# Patient Record
Sex: Female | Born: 1960 | Race: White | Hispanic: No | Marital: Married | Smoking: Never smoker
Health system: Southern US, Community
[De-identification: ages and names within clinical notes are randomized; demographics above are authoritative.]

## PROBLEM LIST (undated history)

## (undated) DIAGNOSIS — R519 Headache, unspecified: Secondary | ICD-10-CM

## (undated) DIAGNOSIS — Z806 Family history of leukemia: Secondary | ICD-10-CM

## (undated) DIAGNOSIS — I10 Essential (primary) hypertension: Secondary | ICD-10-CM

## (undated) DIAGNOSIS — T7840XA Allergy, unspecified, initial encounter: Secondary | ICD-10-CM

## (undated) DIAGNOSIS — Z803 Family history of malignant neoplasm of breast: Secondary | ICD-10-CM

## (undated) HISTORY — DX: Family history of malignant neoplasm of breast: Z80.3

## (undated) HISTORY — DX: Allergy, unspecified, initial encounter: T78.40XA

## (undated) HISTORY — PX: BREAST CYST EXCISION: SHX579

## (undated) HISTORY — DX: Family history of leukemia: Z80.6

## (undated) HISTORY — PX: BREAST BIOPSY: SHX20

## (undated) HISTORY — DX: Essential (primary) hypertension: I10

## (undated) HISTORY — PX: CHOLECYSTECTOMY: SHX55

---

## 1997-11-22 ENCOUNTER — Other Ambulatory Visit: Admission: RE | Admit: 1997-11-22 | Discharge: 1997-11-22 | Payer: Self-pay | Admitting: Obstetrics & Gynecology

## 1998-07-06 ENCOUNTER — Inpatient Hospital Stay (HOSPITAL_COMMUNITY): Admission: AD | Admit: 1998-07-06 | Discharge: 1998-07-09 | Payer: Self-pay | Admitting: Obstetrics & Gynecology

## 1998-10-23 ENCOUNTER — Other Ambulatory Visit: Admission: RE | Admit: 1998-10-23 | Discharge: 1998-10-23 | Payer: Self-pay | Admitting: Obstetrics & Gynecology

## 1999-10-25 ENCOUNTER — Other Ambulatory Visit: Admission: RE | Admit: 1999-10-25 | Discharge: 1999-10-25 | Payer: Self-pay | Admitting: Obstetrics & Gynecology

## 2000-11-18 ENCOUNTER — Other Ambulatory Visit: Admission: RE | Admit: 2000-11-18 | Discharge: 2000-11-18 | Payer: Self-pay | Admitting: Obstetrics & Gynecology

## 2000-11-19 ENCOUNTER — Encounter: Payer: Self-pay | Admitting: Family Medicine

## 2000-11-19 ENCOUNTER — Encounter: Admission: RE | Admit: 2000-11-19 | Discharge: 2000-11-19 | Payer: Self-pay | Admitting: Family Medicine

## 2000-12-10 ENCOUNTER — Observation Stay (HOSPITAL_COMMUNITY): Admission: RE | Admit: 2000-12-10 | Discharge: 2000-12-11 | Payer: Self-pay | Admitting: General Surgery

## 2000-12-10 ENCOUNTER — Encounter: Payer: Self-pay | Admitting: General Surgery

## 2000-12-10 ENCOUNTER — Encounter (INDEPENDENT_AMBULATORY_CARE_PROVIDER_SITE_OTHER): Payer: Self-pay | Admitting: Specialist

## 2001-11-25 ENCOUNTER — Other Ambulatory Visit: Admission: RE | Admit: 2001-11-25 | Discharge: 2001-11-25 | Payer: Self-pay | Admitting: Obstetrics & Gynecology

## 2002-11-30 ENCOUNTER — Other Ambulatory Visit: Admission: RE | Admit: 2002-11-30 | Discharge: 2002-11-30 | Payer: Self-pay | Admitting: Obstetrics & Gynecology

## 2003-12-06 ENCOUNTER — Other Ambulatory Visit: Admission: RE | Admit: 2003-12-06 | Discharge: 2003-12-06 | Payer: Self-pay | Admitting: Obstetrics & Gynecology

## 2005-01-21 ENCOUNTER — Other Ambulatory Visit: Admission: RE | Admit: 2005-01-21 | Discharge: 2005-01-21 | Payer: Self-pay | Admitting: Obstetrics & Gynecology

## 2009-02-09 ENCOUNTER — Encounter: Admission: RE | Admit: 2009-02-09 | Discharge: 2009-02-09 | Payer: Self-pay | Admitting: Obstetrics & Gynecology

## 2009-02-09 IMAGING — MG MM DIAGNOSTIC BILATERAL
1 series · 1 of 1 positions shown · non-contrast
Comparison: [DATE] and [DATE]

CLINICAL DATA: Patient presents for bilateral diagnostic mammogram
due to a new palpable abnormality in the upper outer left breast
felt by the referring physician.

DIGITAL DIAGNOSTIC  BILATERAL  MAMMOGRAM  WITH CAD AND BILATERAL
BREAST ULTRASOUND:

[R CC]
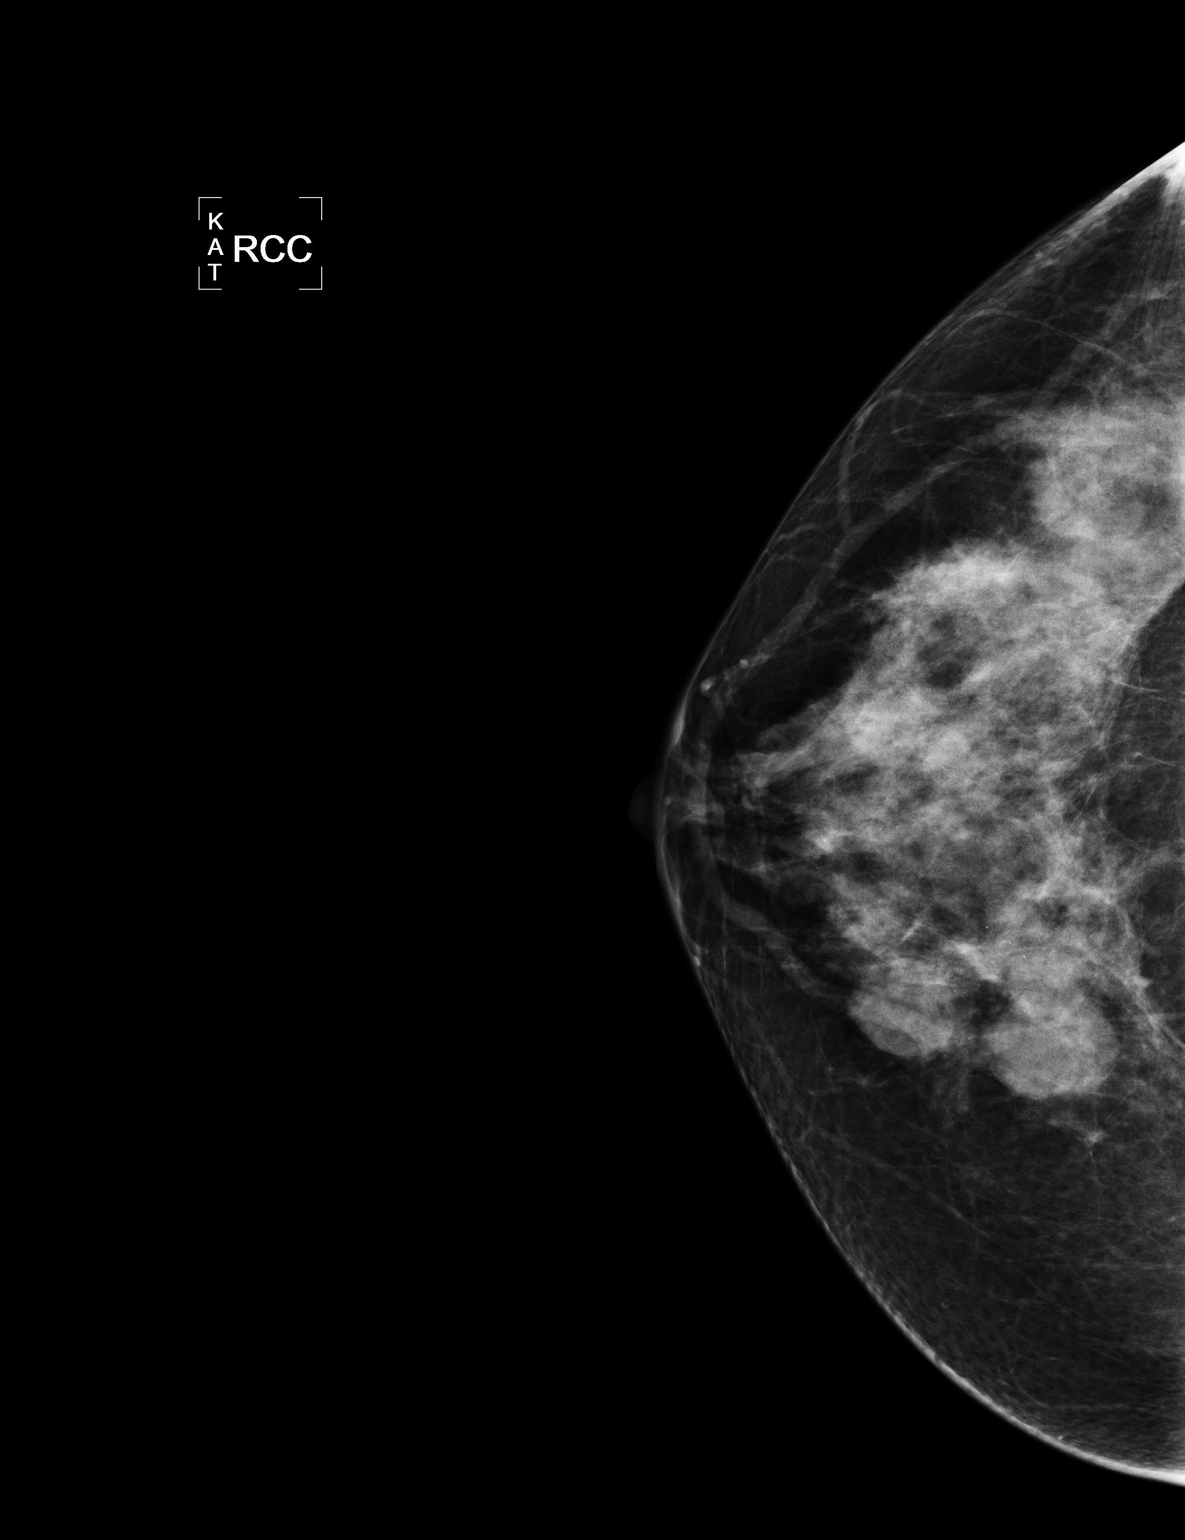

[1 of 1 positions shown; findings below may reference images not displayed]

FINDINGS: Examination demonstrates scattered fibroglandular
density.  There is a circumscribed 3 cm mass in the upper outer
left breast corresponding to patient's palpable abnormality.  There
are two adjacent 1-1.3 cm masses in the inner mid to lower left
breast.  Remainder of the exam is unchanged.
Mammographic images were processed with CAD.

Ultrasound is performed, showing a well defined ovoid simple cyst
at the 2 o'clock position of the left breast 6 cm from the nipple
corresponding to the mammographic and palpable abnormality.  This
measures 3.4 cm in greatest diameter.  Ultrasound evaluation of the
right inner mid to lower breast demonstrates two adjacent simple
cysts corresponding to the mammographic abnormalities.  These
measure 1.4 and 1.6 cm in greatest diameter.
IMPRESSION: 3.4 cm simple cyst at the 2 o'clock position of the left breast
corresponding to patient's palpable abnormality.  Two adjacent
simple cysts in the inner mid to lower right breast.

Recommendations:  Recommend continued annual bilateral screening
mammographic followup.

BI-RADS CATEGORY 2:  Benign finding(s).

## 2010-12-21 NOTE — Op Note (Signed)
[facility]  Patient:    Autumn Patel, ZARAGOZA                        MRN: [id number] Proc. Date: 12/10/00 Adm. Date:  [id number] Attending:  Brandy Hale CC:         Saul Fordyce, FNP, [facility]  Elvina Sidle, M.D.   Operative Report  PREOPERATIVE DIAGNOSIS:  Chronic cholecystitis with cholelithiasis.  POSTOPERATIVE DIAGNOSIS:  Chronic cholecystitis with cholelithiasis.  OPERATION PERFORMED:  Laparoscopic cholecystectomy with intraoperative cholangiogram.  SURGEON:  Angelia Mould. Derrell Lolling, M.D.  ASSISTANT:  Chevis Pretty, M.D.  ANESTHESIA:  INDICATIONS FOR PROCEDURE:  The patient is a 50 year old white female with a one month history of some episodes of epigastric pain radiating to the back. After a severe attack, she was evaluated at [facility]. Liver function tests were elevated with a bilirubin of 1.6, SGOT of 55 and SGPT of 47, mild elevations.  Gallbladder ultrasound showed multiple tiny gallstones and a normal common bile duct.  She has been relatively asymptomatic for the past two weeks although she has had some minor upper abdominal discomfort after meals.  Her liver function tests today are normal. She was brought to the operating room electively.  OPERATIVE FINDINGS:  The gallbladder appeared chronically inflamed.  The cystic duct was patent but narrow in caliber with clear bile in it.  The cholangiogram was normal showing normal intrahepatic and extrahepatic bile ducts, prompt flow of contrast into the duodenum and no filling defect.  The liver, stomach, duodenum, small intestine, large intestine were normal to inspection.  DESCRIPTION OF PROCEDURE:  Following the induction of general endotracheal anesthesia, the patients abdomen was prepped and draped in a sterile fashion. 0.5% Marcaine with epinephrine was used as a local infiltration anesthetic.  A vertically oriented incision was made  inside the lower rim of the umbilicus. The fascia was incised in the midline and the abdominal cavity entered under direct vision. A 10 mm Hasson trocar was inserted and secured with a pursestring suture of 0 Vicryl.  A pneumoperitoneum was created.  A video camera was inserted with visualization and findings as described above.  A 10 mm trocar was placed in the subxiphoid region and two 5 mm trocars were placed in the right midabdomen.  The gallbladder was elevated.  Some adhesions were taken down.  We dissected out the cystic duct very carefully.  A cholangiogram catheter was inserted into the cystic duct.  Cholangiogram was obtained using the C-arm.  This showed normal anatomy and no filling defect and no obstruction as described above.  The cholangiogram catheter was removed.  The cystic duct was secured with multiple metal clips and divided. The cystic artery was carefully isolated, secured with metal clips and divided.  The gallbladder was dissected from its bed with electrocautery and removed through the umbilical port.  The operative field was copiously irrigated.  Hemostasis was excellent.  There was no bleeding and no bile leak whatsoever at the completion of the case.  The irrigation fluid was clear. The trocars were removed under direct vision and there was no bleeding from the trocar sites.  The pneumoperitoneum was released.  The fascia at the umbilicus was closed with 0 Vicryl sutures.  Skin incisions were closed with subcuticular sutures of 4-0 Vicryl and Steri-Strips.  Clean bandages were placed and the patient taken to recovery room in stable condition.  Estimated blood loss was about  10 cc.  Complications were none.  Sponge, needle and instrument counts were correct. DD:  12/10/00 TD:  12/10/00 Job: [id number]

## 2012-03-05 LAB — HM MAMMOGRAPHY: HM MAMMO: NORMAL

## 2012-10-22 ENCOUNTER — Ambulatory Visit: Payer: Self-pay | Admitting: Physician Assistant

## 2012-10-23 ENCOUNTER — Encounter: Payer: Self-pay | Admitting: Physician Assistant

## 2012-10-23 ENCOUNTER — Ambulatory Visit (INDEPENDENT_AMBULATORY_CARE_PROVIDER_SITE_OTHER): Payer: BC Managed Care – PPO | Admitting: Physician Assistant

## 2012-10-23 VITALS — BP 142/86 | HR 80 | Temp 98.6°F | Resp 18 | Ht 65.25 in | Wt 173.0 lb

## 2012-10-23 DIAGNOSIS — J322 Chronic ethmoidal sinusitis: Secondary | ICD-10-CM

## 2012-10-23 MED ORDER — AMOXICILLIN-POT CLAVULANATE 875-125 MG PO TABS: 1.0000 | ORAL_TABLET | Freq: Two times a day (BID) | ORAL | Status: DC

## 2012-10-23 MED ORDER — PREDNISONE 20 MG PO TABS: ORAL_TABLET | ORAL | Status: DC

## 2012-10-23 NOTE — Progress Notes (Signed)
Patient ID: Autumn Patel MRN: [id number], DOB: Feb 06, 1961, 52 y.o. Date of Encounter: @DATE @  Chief Complaint: Sinus Infection  HPI: 52 y.o. year old female  presents with 3 weeks of "cold" symptoms but has gotten much worse. Now with lots of pressure and pain in head especially when bends forward. Nose does not run, but throughout day she blows nose and fills up 5 tissues at a time with thick mucus. No chest cong, ST F/C.    History reviewed. No pertinent past medical history.   Home Meds: No current outpatient prescriptions on file prior to visit.   No current facility-administered medications on file prior to visit.    Allergies: No Known Allergies  History   Social History  . Marital Status: Married    Spouse Name: N/A    Number of Children: N/A  . Years of Education: N/A   Occupational History  . Not on file.   Social History Main Topics  . Smoking status: Not on file  . Smokeless tobacco: Not on file  . Alcohol Use: Not on file  . Drug Use: Not on file  . Sexually Active: Not on file   Other Topics Concern  . Not on file   Social History Narrative  . No narrative on file    No family history on file.   Review of Systems: Constitutional: negative for chills, fever, night sweats  HEENT: negative for vision changes, hearing loss,  ST, epistaxis. See HPI for positives. Cardiovascular: negative for chest pain or palpitations. No new/increased shortness of breath or dyspnea on exertion Respiratory: negative for hemoptysis, wheezing, shortness of breath, or cough Abdominal: negative for abdominal pain, nausea, vomiting, diarrhea, or constipation Dermatological: negative for rash or concerning skin lesions Neurologic: negative for headache, dizziness, or syncope All other systems reviewed and are otherwise negative with the exception to those above and in the HPI.   Physical Exam: Blood pressure 142/86, pulse 80, temperature 98.6 F (37 C), temperature  source Oral, resp. rate 18, height 5' 5.25" (1.657 m), weight 173 lb (78.472 kg), last menstrual period 10/02/2012., Body mass index is 28.58 kg/(m^2). General: Well developed, well nourished, in no acute distress. Head: Normocephalic, atraumatic, eyes without discharge, sclera non-icteric, nares are without discharge. Bilateral auditory canals clear, TM's are without perforation, pearly grey and translucent with reflective cone of light bilaterally. Oral cavity moist, posterior pharynx without exudate, erythema, peritonsillar abscess, or post nasal drip.Tenderness with percussion of frontal and maxillary sinuses.  Neck: Supple. No thyromegaly. Full ROM. No lymphadenopathy. Lungs: Clear bilaterally to auscultation without wheezes, rales, or rhonchi. Breathing is unlabored. Heart: RRR with S1 S2. No murmurs, rubs, or gallops. Abdomen: Soft, non-tender, non-distended with normoactive bowel sounds. No hepatomegaly. No rebound/guarding. No obvious abdominal masses. Musculoskeletal:  Strength and tone normal for age. Extremities/Skin: Warm and dry. No clubbing or cyanosis. No edema. No rashes or suspicious lesions. Neuro: Alert and oriented X 3. Moves all extremities spontaneously. Gait is normal. CNII-XII grossly in tact. Psych:  Responds to questions appropriately with a normal affect.      ASSESSMENT AND PLAN:  52 y.o. year old female 1. Ethmoid sinusitis  - amoxicillin-clavulanate (AUGMENTIN) 875-125 MG per tablet; Take 1 tablet by mouth 2 (two) times daily.  Dispense: 20 tablet; Refill: 0 - predniSONE (DELTASONE) 20 MG tablet; Take 3 daily for 2 days, then 2 daily for 2 days, then 1 daily for 2 days.  Dispense: 12 tablet; Refill: 0   Signed, Shon Hale  Autumn Patel, [place], [facility] 10/23/2012 9:17 AM

## 2012-11-02 ENCOUNTER — Telehealth: Payer: Self-pay | Admitting: Physician Assistant

## 2012-11-02 DIAGNOSIS — R0981 Nasal congestion: Secondary | ICD-10-CM

## 2012-11-02 MED ORDER — FLUTICASONE PROPIONATE 50 MCG/ACT NA SUSP: 2.0000 | Freq: Every day | NASAL | Status: DC

## 2012-11-02 NOTE — Telephone Encounter (Signed)
At OV I Rxed Augment x 10 days. This is a strong abx for sinus infections. I also Rxed prednisone which sholuld have decreased the pressure and pain. Is she still blowing out a lot of dark thick mucus or just stopped up?  If just stopped up, take otc decong and call in Rx Flonase 2 sprays per nostril QD prn # one/ 2 refills.  If still with a lot of mucus, call in Levaquin 750mg  one po QD x 10 days # 10 /0. To take this in addition to flonase and otc decongestant

## 2012-11-02 NOTE — Telephone Encounter (Signed)
Pt called.  Still with stuffy nose only.  Told to take OTC decongestant ( ask pharmacist which one good for pt with HTN)  Rx for flonase sent to [facility]

## 2013-01-01 ENCOUNTER — Telehealth: Payer: Self-pay | Admitting: Family Medicine

## 2013-01-01 MED ORDER — AMLODIPINE BESYLATE 5 MG PO TABS: 5.0000 mg | ORAL_TABLET | Freq: Every day | ORAL | Status: DC

## 2013-01-01 NOTE — Telephone Encounter (Signed)
Med rf °

## 2013-03-24 ENCOUNTER — Other Ambulatory Visit: Payer: Self-pay | Admitting: Family Medicine

## 2013-04-02 ENCOUNTER — Telehealth: Payer: Self-pay | Admitting: Family Medicine

## 2013-04-02 MED ORDER — AMLODIPINE BESYLATE 5 MG PO TABS: 5.0000 mg | ORAL_TABLET | Freq: Every day | ORAL | Status: DC

## 2013-04-02 NOTE — Telephone Encounter (Signed)
Norvasc 5 mg 1 QD #30  Does pt need appointment for med check?

## 2013-04-02 NOTE — Telephone Encounter (Signed)
Pt called.  Is due for 6 mth f/u appt.  Appt made and refill sent.

## 2013-04-12 ENCOUNTER — Other Ambulatory Visit: Payer: Self-pay | Admitting: Physician Assistant

## 2013-04-20 ENCOUNTER — Ambulatory Visit (INDEPENDENT_AMBULATORY_CARE_PROVIDER_SITE_OTHER): Payer: BC Managed Care – PPO | Admitting: Family Medicine

## 2013-04-20 ENCOUNTER — Encounter: Payer: Self-pay | Admitting: Family Medicine

## 2013-04-20 VITALS — BP 130/82 | HR 60 | Temp 98.1°F | Resp 16 | Ht 67.0 in | Wt 169.0 lb

## 2013-04-20 DIAGNOSIS — I10 Essential (primary) hypertension: Secondary | ICD-10-CM | POA: Insufficient documentation

## 2013-04-20 DIAGNOSIS — T7840XA Allergy, unspecified, initial encounter: Secondary | ICD-10-CM | POA: Insufficient documentation

## 2013-04-20 DIAGNOSIS — Z23 Encounter for immunization: Secondary | ICD-10-CM

## 2013-04-20 DIAGNOSIS — R1900 Intra-abdominal and pelvic swelling, mass and lump, unspecified site: Secondary | ICD-10-CM

## 2013-04-20 LAB — COMPLETE METABOLIC PANEL WITH GFR
AST: 16 U/L (ref 0–37)
Albumin: 4 g/dL (ref 3.5–5.2)
Alkaline Phosphatase: 63 U/L (ref 39–117)
BUN: 15 mg/dL (ref 6–23)
Potassium: 4.8 mEq/L (ref 3.5–5.3)
Sodium: 138 mEq/L (ref 135–145)

## 2013-04-20 LAB — LIPID PANEL
Cholesterol: 238 mg/dL — ABNORMAL HIGH (ref 0–200)
HDL: 74 mg/dL (ref >39)
Total CHOL/HDL Ratio: 3.2 Ratio
VLDL: 25 mg/dL (ref 0–40)

## 2013-04-20 MED ORDER — AMLODIPINE BESYLATE 5 MG PO TABS: ORAL_TABLET | ORAL | Status: DC

## 2013-04-20 NOTE — Progress Notes (Signed)
Subjective:    Patient ID: Autumn Patel, female    DOB: 05-29-1961, 52 y.o.   MRN: [id number]  HPI Patient is a very pleasant 52 year old white female who is here today for followup of her hypertension. She is currently on amlodipine 5 mg by mouth daily. She denies any chest pain, shortness of breath, dyspnea on exertion. She is fasting second check a fasting lipid panel as well as a CMP. She denies any other medical complaints. Her gynecologist is performing her mammograms and Pap smears. She has a colonoscopy scheduled for later this year. She declines a flu shot today. The remainder of her review of systems is negative. Past Medical History  Diagnosis Date  . Hypertension   . Allergy    Past Surgical History  Procedure Laterality Date  . Cholecystectomy    . Cesarean section     Current Outpatient Prescriptions on File Prior to Visit  Medication Sig Dispense Refill  . levonorgestrel-ethinyl estradiol (ENPRESSE,TRIVORA) tablet Take 1 tablet by mouth daily.      Marland Kitchen Lysine 500 MG TABS Take 500 mg by mouth daily.      . Multiple Vitamins-Minerals (MULTIVITAMIN PO) Take by mouth. [facility] daily mvi with iron and 500mg  calcium plus zinc & folic acid      . fluticasone (FLONASE) 50 MCG/ACT nasal spray Place 2 sprays into the nose daily. Each nostril once daily  1 g  2   No current facility-administered medications on file prior to visit.   No Known Allergies History   Social History  . Marital Status: Married    Spouse Name: N/A    Number of Children: N/A  . Years of Education: N/A   Occupational History  . Not on file.   Social History Main Topics  . Smoking status: Never Smoker   . Smokeless tobacco: Never Used  . Alcohol Use: No  . Drug Use: No  . Sexual Activity: Yes     Comment: married   Other Topics Concern  . Not on file   Social History Narrative  . No narrative on file      Review of Systems  All other systems reviewed and are negative.       Objective:   Physical Exam  Vitals reviewed. Constitutional: She appears well-developed and well-nourished.  Neck: Neck supple. No JVD present. No thyromegaly present.  Cardiovascular: Normal rate, regular rhythm, normal heart sounds and intact distal pulses.  Exam reveals no gallop and no friction rub.   No murmur heard. Pulmonary/Chest: Effort normal and breath sounds normal. No respiratory distress. She has no wheezes. She has no rales. She exhibits no tenderness.  Abdominal: Soft. Bowel sounds are normal. She exhibits mass. She exhibits no distension. There is no tenderness. There is no rebound and no guarding.  Musculoskeletal: She exhibits no edema.  Lymphadenopathy:    She has no cervical adenopathy.   there is a pulsatile midline abdominal mass. She has a pronounced abdominal pulse.        Assessment & Plan:  1. HTN (hypertension) Blood pressure is well controlled. Continue amlodipine 5 mg by mouth daily. I gave the patient a years worth of refills. I will check a fasting lipid panel. Her goal LDL is less than 130 - COMPLETE METABOLIC PANEL WITH GFR - Lipid panel  2. Pulsatile abdominal mass The risk of a AAA in this patient is extremely low. She's not a smoker. She has no family history. However her exam is pronounced for  a pulsatile abdominal mass. Furthermore given her body habitus I do not expect to be able to palpate the aorta this easily.  Therefore I will schedule the patient for an abdominal ultrasound to evaluate for an abdominal aortic aneurysm - US Aorta; Future

## 2013-04-20 NOTE — Addendum Note (Signed)
Addended by: Elvina Mattes T on: 04/20/2013 08:33 AM   Modules accepted: Orders

## 2013-04-23 ENCOUNTER — Ambulatory Visit
Admission: RE | Admit: 2013-04-23 | Discharge: 2013-04-23 | Disposition: A | Discharge status: Home / Self Care | Payer: BC Managed Care – PPO | Source: Ambulatory Visit | Attending: Family Medicine | Admitting: Family Medicine

## 2013-04-23 DIAGNOSIS — R1900 Intra-abdominal and pelvic swelling, mass and lump, unspecified site: Secondary | ICD-10-CM

## 2013-04-23 IMAGING — US US AORTA
1 series · 13 of 13 positions shown · non-contrast
Comparison: Office renal ultrasound [DATE] from [REDACTED].

CLINICAL DATA: Pulsatile abdominal mass on physical examination.

EXAM:
ULTRASOUND OF ABDOMINAL AORTA
TECHNIQUE: Ultrasound examination of the abdominal aorta was performed to
evaluate for abdominal aortic aneurysm.

[Series 1: us aorta · 0.26mm/px · 13 of 13 slices shown]
[im 1/13]
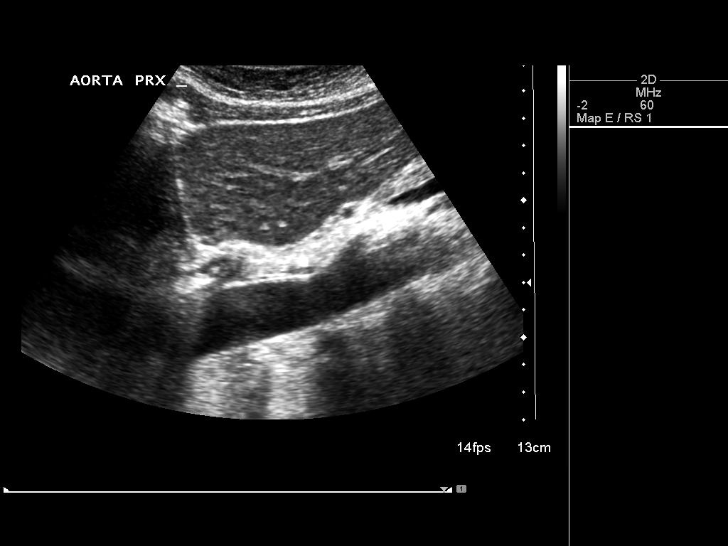
[im 2/13]
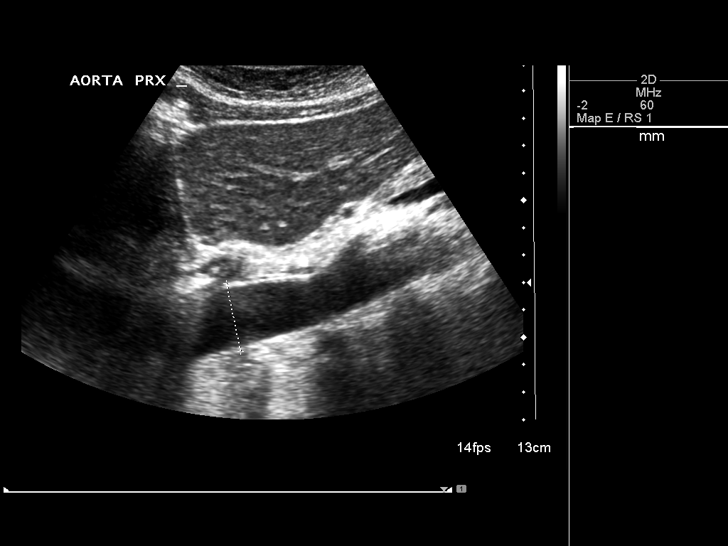
[im 3/13]
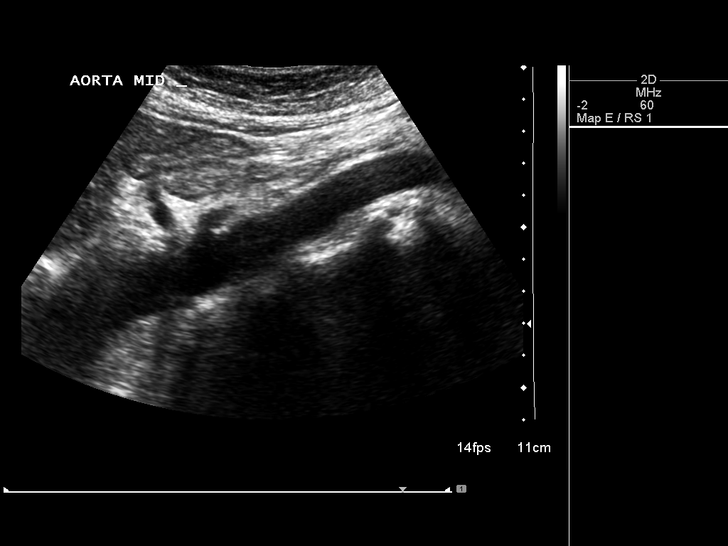
[im 4/13]
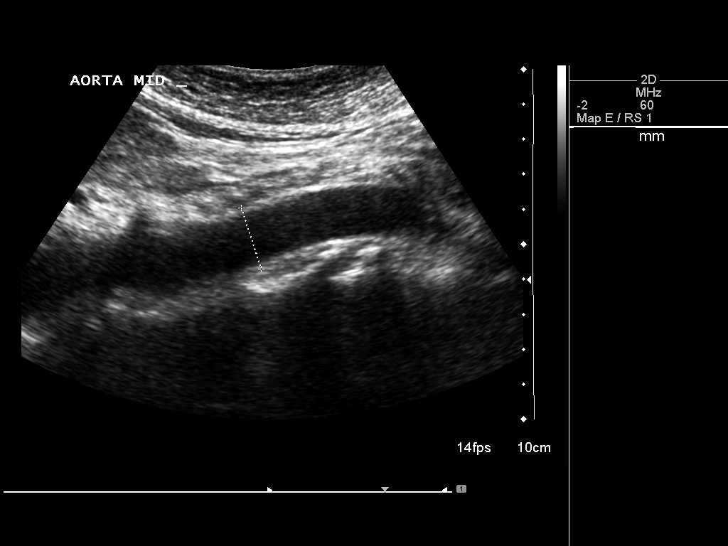
[im 5/13]
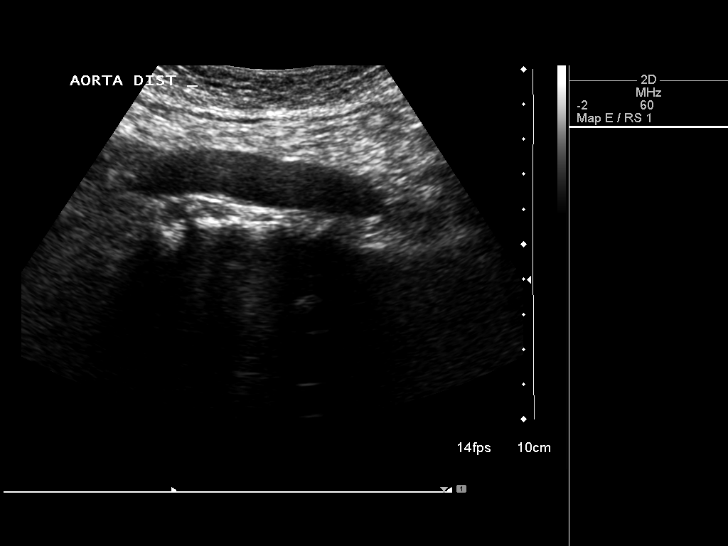
[im 6/13]
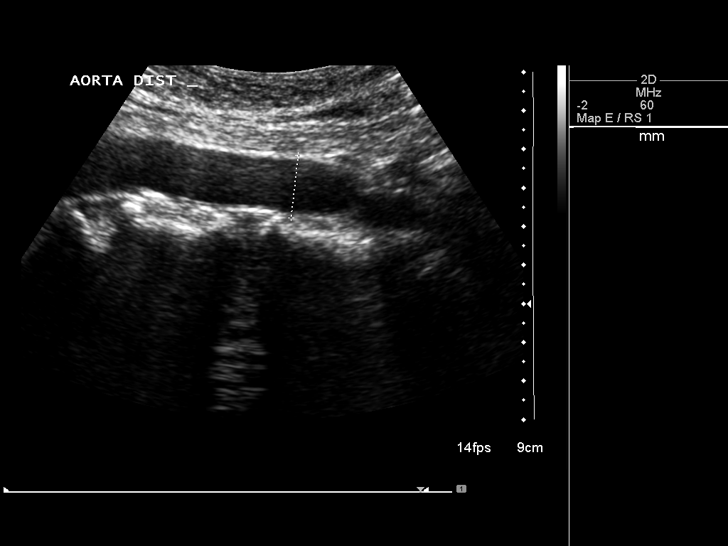
[im 7/13]
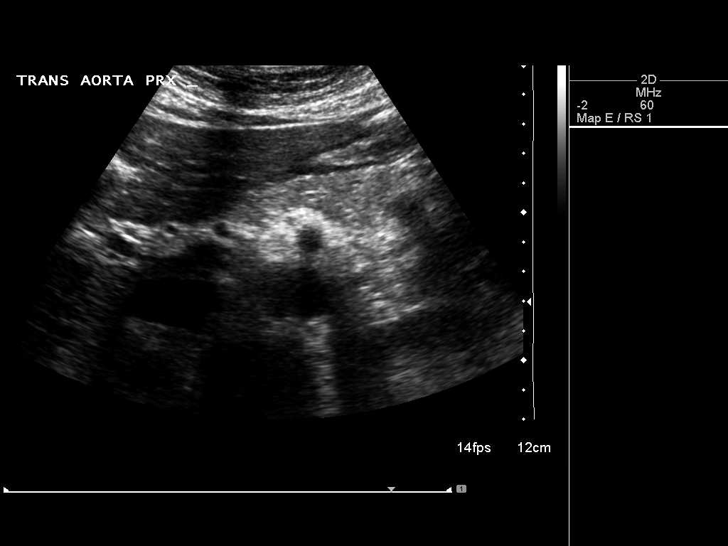
[im 8/13]
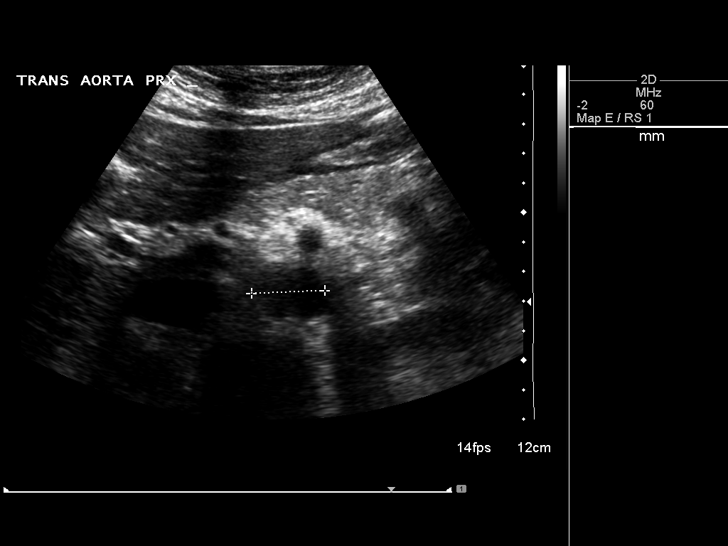
[im 9/13]
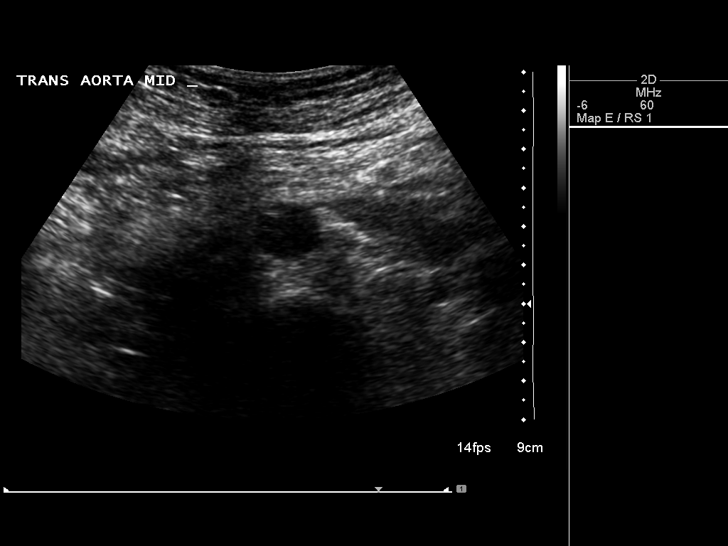
[im 10/13]
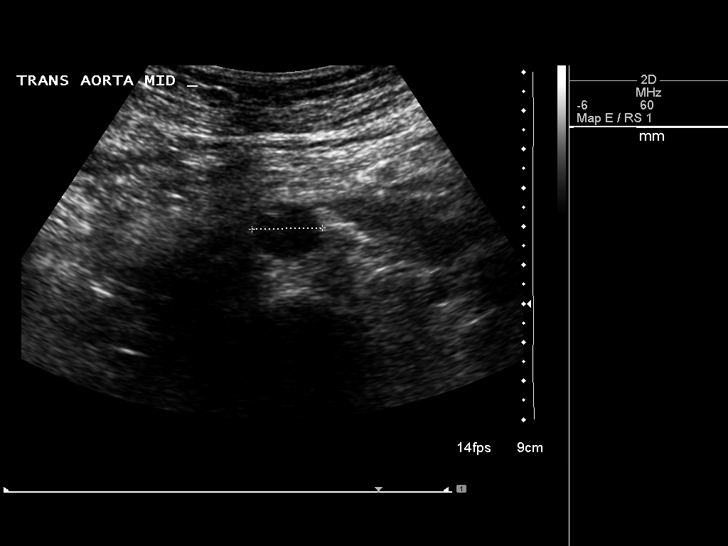
[im 11/13]
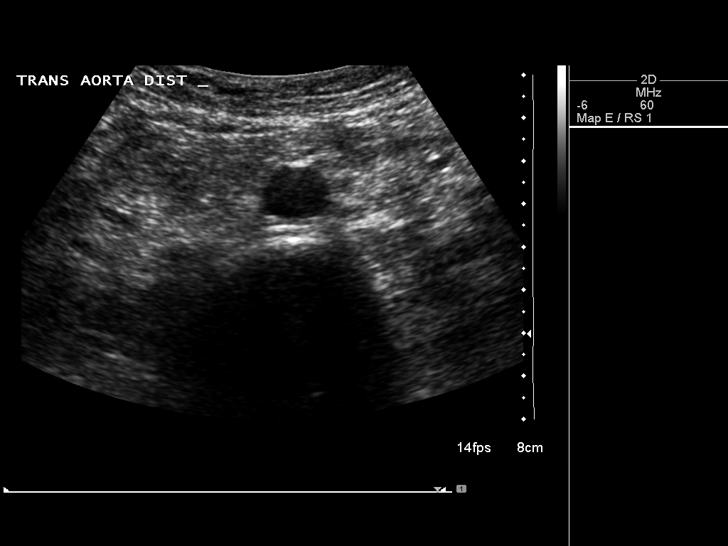
[im 12/13]
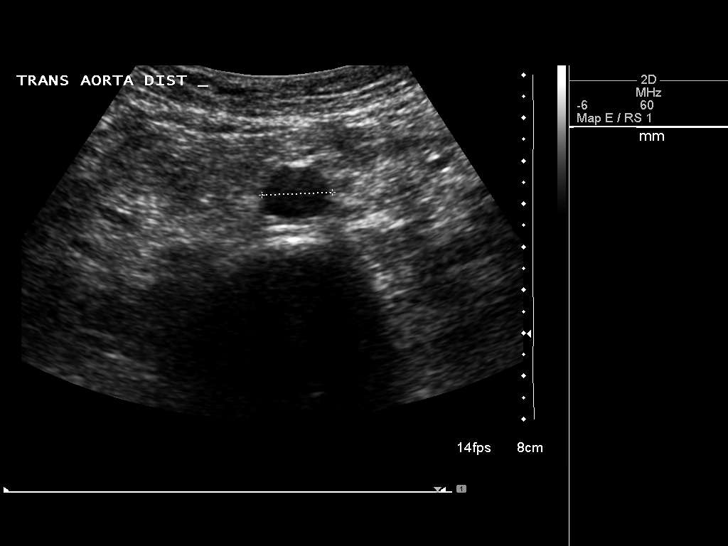
[im 13/13]
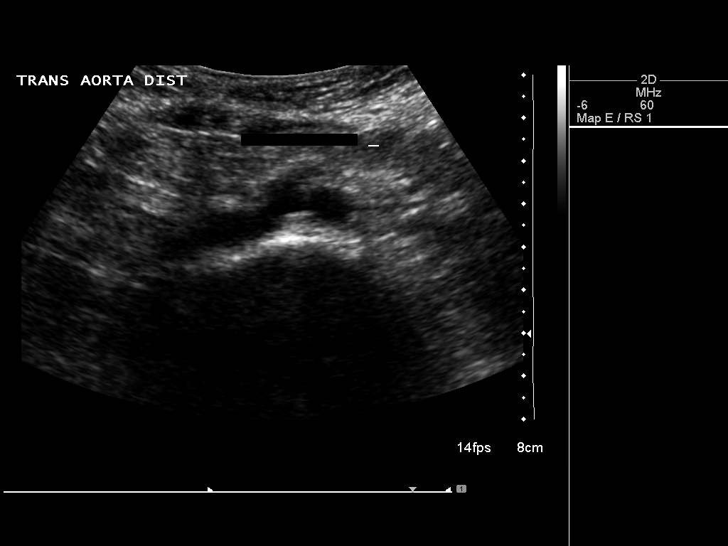

[13 of 13 positions shown; findings below may reference images not displayed]

FINDINGS: Abdominal Aorta

No aneurysm identified.

Maximum AP

Diameter:  2.5 cm.

Maximum TRV

Diameter: 2.5 cm.
IMPRESSION: Normal abdominal aortic ultrasound. No evidence of aneurysm.

## 2013-07-05 ENCOUNTER — Other Ambulatory Visit: Payer: Self-pay | Admitting: Physician Assistant

## 2013-07-05 NOTE — Telephone Encounter (Signed)
Medication refilled per protocol. 

## 2013-07-07 ENCOUNTER — Telehealth: Payer: Self-pay | Admitting: Family Medicine

## 2013-07-07 DIAGNOSIS — R0981 Nasal congestion: Secondary | ICD-10-CM

## 2013-07-07 NOTE — Telephone Encounter (Signed)
Pt is calling because she is sick with the same stuff she gets this time of year and she is not going to be able to come in because she is going out [place] and she is wondering if something could be called in for her Call back number is [phone]

## 2013-07-07 NOTE — Telephone Encounter (Signed)
I'm sorry if she is trying to get out of town etc but she needs to be evaluated. NTBS.

## 2013-07-09 MED ORDER — FLUTICASONE PROPIONATE 50 MCG/ACT NA SUSP: 2.0000 | Freq: Every day | NASAL | Status: DC

## 2013-07-09 NOTE — Telephone Encounter (Signed)
Just wanted refill of Flonase for seasonal thing.  Refill sent

## 2014-02-23 ENCOUNTER — Other Ambulatory Visit: Payer: Self-pay | Admitting: Family Medicine

## 2014-04-04 ENCOUNTER — Telehealth: Payer: Self-pay | Admitting: Family Medicine

## 2014-04-04 NOTE — Telephone Encounter (Signed)
Patient says she is having some issues, and she just would like advice on to make an appt with [place] or not  [phone]

## 2014-04-04 NOTE — Telephone Encounter (Signed)
Yes please call her back and set up an appt. She has not been here in almost a year. Thanks

## 2014-05-06 ENCOUNTER — Encounter: Payer: Self-pay | Admitting: Family Medicine

## 2014-05-06 ENCOUNTER — Ambulatory Visit (INDEPENDENT_AMBULATORY_CARE_PROVIDER_SITE_OTHER): Payer: BC Managed Care – PPO | Admitting: Family Medicine

## 2014-05-06 VITALS — BP 126/80 | HR 94 | Temp 98.5°F | Resp 16 | Ht 67.0 in | Wt 169.0 lb

## 2014-05-06 DIAGNOSIS — F418 Other specified anxiety disorders: Secondary | ICD-10-CM

## 2014-05-06 MED ORDER — CLONAZEPAM 0.5 MG PO TABS: 0.5000 mg | ORAL_TABLET | Freq: Three times a day (TID) | ORAL | Status: DC | PRN

## 2014-05-06 NOTE — Progress Notes (Signed)
   Subjective:    Patient ID: Autumn Patel, female    DOB: 1960/10/05, 53 y.o.   MRN: [id number]  HPI  Patient has had severe anxiety over the last 2 weeks. She does not want to discuss specifics but there has been a tremendous situational stress in her life. For instance her daughter is having suicidal thoughts and is performing self-mutilating behavior.  There is another situation that she did not want to discuss. However these issues are causing her to have a difficult time sleeping, a difficult time functioning at work, she is having a very poor appetite. She's having to force herself to eat. She finds herself crying frequently for no reason at work. She like to take medicine on an as-needed basis to help control her anxiety and help calm her nerves.  She denies any depression or anhedonia. Past Medical History  Diagnosis Date  . Hypertension   . Allergy    Past Surgical History  Procedure Laterality Date  . Cholecystectomy    . Cesarean section     Current Outpatient Prescriptions on File Prior to Visit  Medication Sig Dispense Refill  . amLODipine (NORVASC) 5 MG tablet TAKE 1 TABLET BY MOUTH DAILY  30 tablet  2  . fluticasone (FLONASE) 50 MCG/ACT nasal spray Place 2 sprays into both nostrils daily. Each nostril once daily  1 g  2  . levonorgestrel-ethinyl estradiol (ENPRESSE,TRIVORA) tablet Take 1 tablet by mouth daily.      Marland Kitchen. Lysine 500 MG TABS Take 500 mg by mouth daily.      . Multiple Vitamins-Minerals (MULTIVITAMIN PO) Take by mouth. [facility] daily mvi with iron and 500mg  calcium plus zinc & folic acid       No current facility-administered medications on file prior to visit.   No Known Allergies History   Social History  . Marital Status: Married    Spouse Name: N/A    Number of Children: N/A  . Years of Education: N/A   Occupational History  . Not on file.   Social History Main Topics  . Smoking status: Never Smoker   . Smokeless tobacco: Never Used  . Alcohol Use:  No  . Drug Use: No  . Sexual Activity: Yes     Comment: married   Other Topics Concern  . Not on file   Social History Narrative  . No narrative on file     Review of Systems  All other systems reviewed and are negative.      Objective:   Physical Exam  Vitals reviewed. Constitutional: She appears well-developed and well-nourished. No distress.  Cardiovascular: Normal rate, regular rhythm and normal heart sounds.   Pulmonary/Chest: Effort normal and breath sounds normal. No respiratory distress. She has no wheezes. She has no rales.  Skin: She is not diaphoretic.  Psychiatric: She has a normal mood and affect. Her behavior is normal. Judgment and thought content normal.          Assessment & Plan:  Situational anxiety - Plan: clonazePAM (KLONOPIN) 0.5 MG tablet  patient is currently undergoing situational stress. Therefore we will begin Klonopin 0.5 mg by mouth Q8 hours when necessary anxiety. I warned the patient about habituation and dependence he on this medication. I like to recheck in one month. If she's having to use the medication frequently, we may want to add an SSRI.

## 2014-06-16 ENCOUNTER — Telehealth: Payer: Self-pay | Admitting: Family Medicine

## 2014-06-16 NOTE — Telephone Encounter (Signed)
Patient calling to see if maybe we could prescribe her a sleep aid if possible  Please call her back at [phone] [facility]

## 2014-06-17 NOTE — Telephone Encounter (Signed)
LMTRC

## 2014-06-17 NOTE — Telephone Encounter (Signed)
She could take clonazepam as needed.  But if that is not working, she could try ambien 10 qhs prn.  Do not take the two medicines simultaneously.

## 2014-06-17 NOTE — Telephone Encounter (Signed)
Pt had not been taking the Clonazepam as life had calmed down some but she is willing to try that for her sleep and if not help she will call us back and we can call in the Ambien at that time.

## 2014-07-25 ENCOUNTER — Ambulatory Visit (INDEPENDENT_AMBULATORY_CARE_PROVIDER_SITE_OTHER): Payer: BC Managed Care – PPO | Admitting: Family Medicine

## 2014-07-25 ENCOUNTER — Encounter: Payer: Self-pay | Admitting: Family Medicine

## 2014-07-25 VITALS — BP 130/70 | HR 92 | Temp 98.1°F | Resp 16 | Ht 67.0 in | Wt 151.0 lb

## 2014-07-25 DIAGNOSIS — F32A Depression, unspecified: Secondary | ICD-10-CM

## 2014-07-25 DIAGNOSIS — F329 Major depressive disorder, single episode, unspecified: Secondary | ICD-10-CM

## 2014-07-25 DIAGNOSIS — F418 Other specified anxiety disorders: Secondary | ICD-10-CM

## 2014-07-25 MED ORDER — ESCITALOPRAM OXALATE 10 MG PO TABS: 10.0000 mg | ORAL_TABLET | Freq: Every day | ORAL | Status: DC

## 2014-07-25 MED ORDER — CLONAZEPAM 0.5 MG PO TABS: 0.5000 mg | ORAL_TABLET | Freq: Three times a day (TID) | ORAL | Status: DC | PRN

## 2014-07-25 NOTE — Progress Notes (Signed)
Subjective:    Patient ID: Autumn Patel, female    DOB: 03-22-1961, 53 y.o.   MRN: [id number]  HPI Please see my last office visit. At that time the patient was having a difficult time sleeping as well as anxiety attacks. Her daughter was suffering with depression and even attempted suicide. Patient is also going through a difficult time in her marriage. They have contemplated a divorce. With all the social stress that the patient is going through, her anxiety and depression have worsened. She now deals with nightly insomnia. She reports poor energy. She has poor concentration. She has no appetite. She has lost almost 30 pounds over the last year unintentionally. She continues to need the Klonopin during the day to help her relax. She is also trying to use the medication at night to help her sleep. She is worried about becoming dependent on the medication. She would like to try medication to help manage the symptoms with less chance of dependency. Past Medical History  Diagnosis Date  . Hypertension   . Allergy    Past Surgical History  Procedure Laterality Date  . Cholecystectomy    . Cesarean section     Current Outpatient Prescriptions on File Prior to Visit  Medication Sig Dispense Refill  . amLODipine (NORVASC) 5 MG tablet TAKE 1 TABLET BY MOUTH DAILY 30 tablet 2  . fluticasone (FLONASE) 50 MCG/ACT nasal spray Place 2 sprays into both nostrils daily. Each nostril once daily 1 g 2  . Lysine 500 MG TABS Take 500 mg by mouth daily.    Marland Kitchen. triamterene-hydrochlorothiazide (MAXZIDE-25) 37.5-25 MG per tablet Take 1 tablet by mouth daily.     Marland Kitchen. levonorgestrel-ethinyl estradiol (ENPRESSE,TRIVORA) tablet Take 1 tablet by mouth daily.    . Multiple Vitamins-Minerals (MULTIVITAMIN PO) Take by mouth. [facility] daily mvi with iron and 500mg  calcium plus zinc & folic acid     No current facility-administered medications on file prior to visit.   No Known Allergies History   Social History  . Marital  Status: Married    Spouse Name: N/A    Number of Children: N/A  . Years of Education: N/A   Occupational History  . Not on file.   Social History Main Topics  . Smoking status: Never Smoker   . Smokeless tobacco: Never Used  . Alcohol Use: No  . Drug Use: No  . Sexual Activity: Yes     Comment: married   Other Topics Concern  . Not on file   Social History Narrative      Review of Systems  All other systems reviewed and are negative.      Objective:   Physical Exam  Cardiovascular: Normal rate, regular rhythm and normal heart sounds.   Pulmonary/Chest: Effort normal and breath sounds normal.  Psychiatric: She has a normal mood and affect. Her behavior is normal. Judgment and thought content normal.  Vitals reviewed.         Assessment & Plan:  Situational anxiety - Plan: clonazePAM (KLONOPIN) 0.5 MG tablet  Depression - Plan: escitalopram (LEXAPRO) 10 MG tablet, clonazePAM (KLONOPIN) 0.5 MG tablet  Patient denies any suicidal ideation. I do believe she is suffering from depression and situational anxiety. I will start the patient on Lexapro 10 mg by mouth daily. Recheck in one month. Meanwhile she can use Klonopin 1-2 tablets during the day as needed for anxiety and 1-2 tablets as needed at night for insomnia. Recheck in one month if no better  or immediately if worse.

## 2014-10-04 NOTE — Telephone Encounter (Signed)
Patient was seen in October.

## 2014-11-01 ENCOUNTER — Other Ambulatory Visit: Payer: Self-pay | Admitting: Obstetrics & Gynecology

## 2014-11-01 DIAGNOSIS — N6001 Solitary cyst of right breast: Secondary | ICD-10-CM

## 2014-11-03 ENCOUNTER — Ambulatory Visit
Admission: RE | Admit: 2014-11-03 | Discharge: 2014-11-03 | Disposition: A | Discharge status: Home / Self Care | Payer: BC Managed Care – PPO | Source: Ambulatory Visit | Attending: Obstetrics & Gynecology | Admitting: Obstetrics & Gynecology

## 2014-11-03 ENCOUNTER — Other Ambulatory Visit: Payer: Self-pay | Admitting: Obstetrics & Gynecology

## 2014-11-03 DIAGNOSIS — N6002 Solitary cyst of left breast: Secondary | ICD-10-CM

## 2014-11-03 DIAGNOSIS — N6001 Solitary cyst of right breast: Secondary | ICD-10-CM

## 2014-11-03 IMAGING — MG MM DIAG BREAST TOMO BILATERAL
6 of 12 series · 6 of 36 positions shown · non-contrast
Comparison: Prior exams

CLINICAL DATA: Palpable abnormalities in each breast. Patient has
known breast cysts. Patient also areas that increased risk for
breast carcinoma with 2 sisters having been diagnosed with breast
carcinoma in her 40s.

EXAM:
DIGITAL DIAGNOSTIC BILATERAL MAMMOGRAM WITH 3D TOMOSYNTHESIS WITH
CAD
ULTRASOUND BILATERAL BREAST

[R CC]
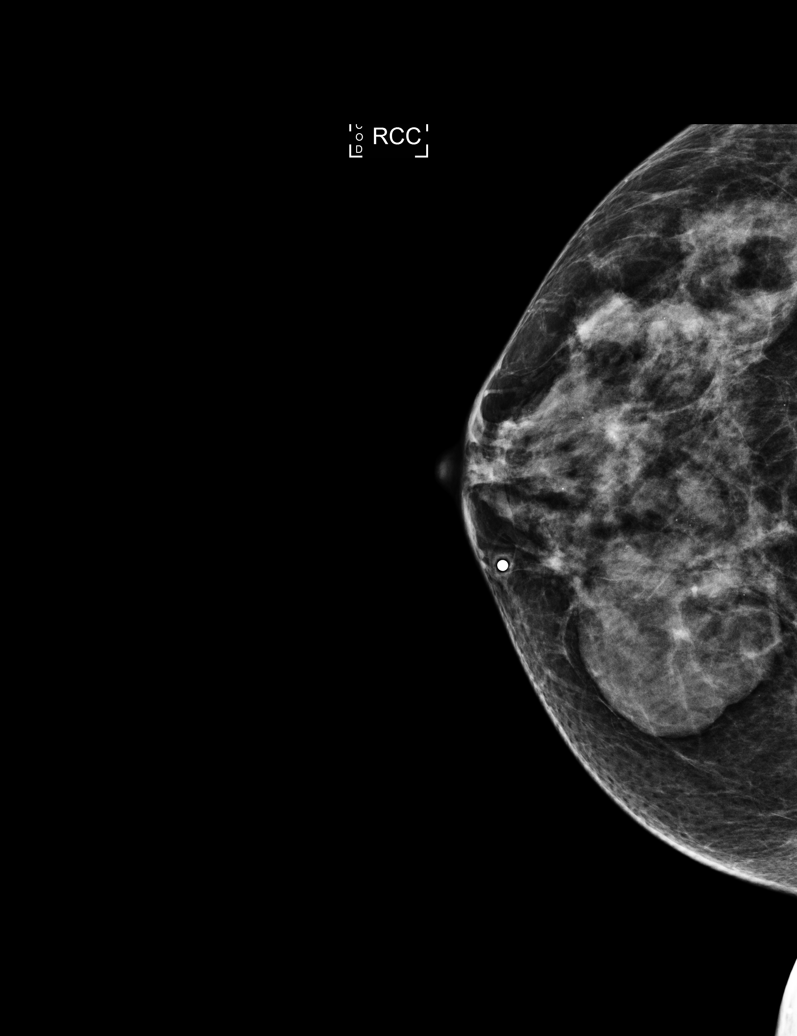

[L CC]
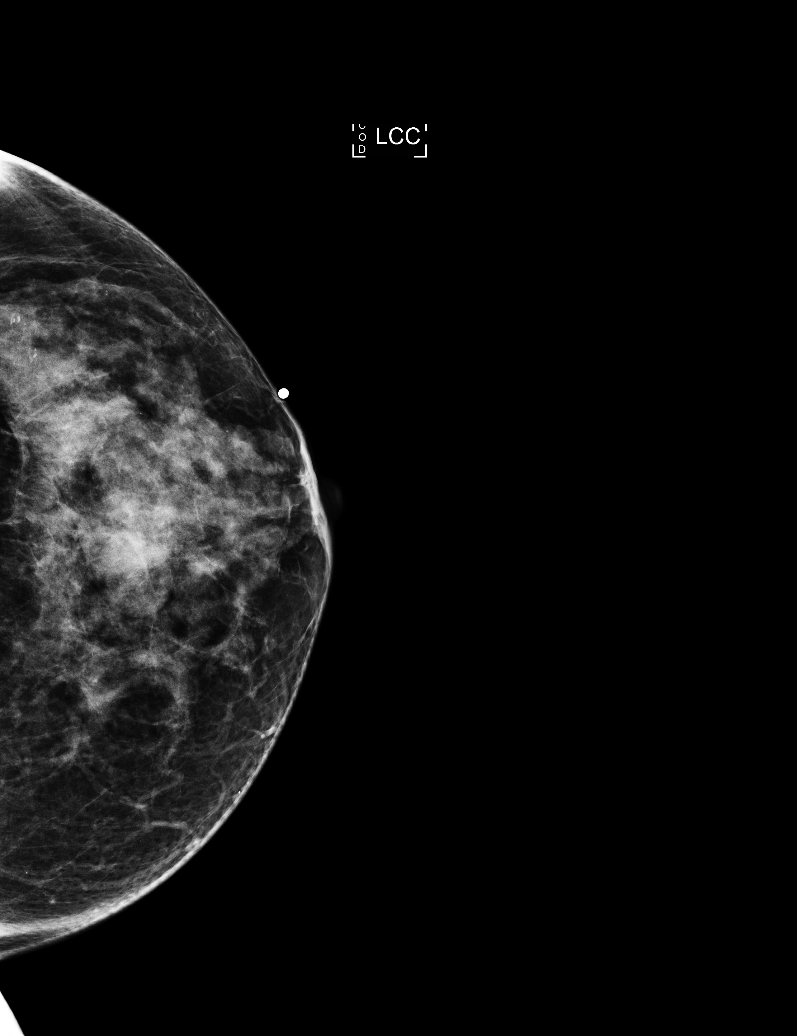

[L TAN]
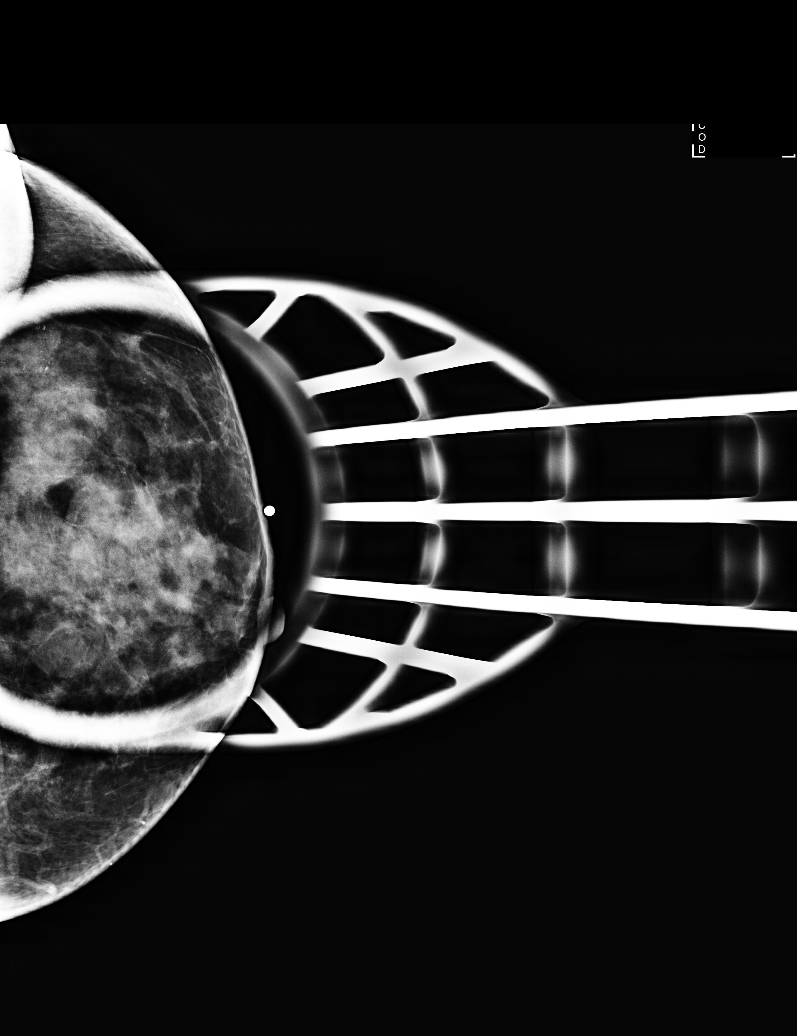

[R TAN]
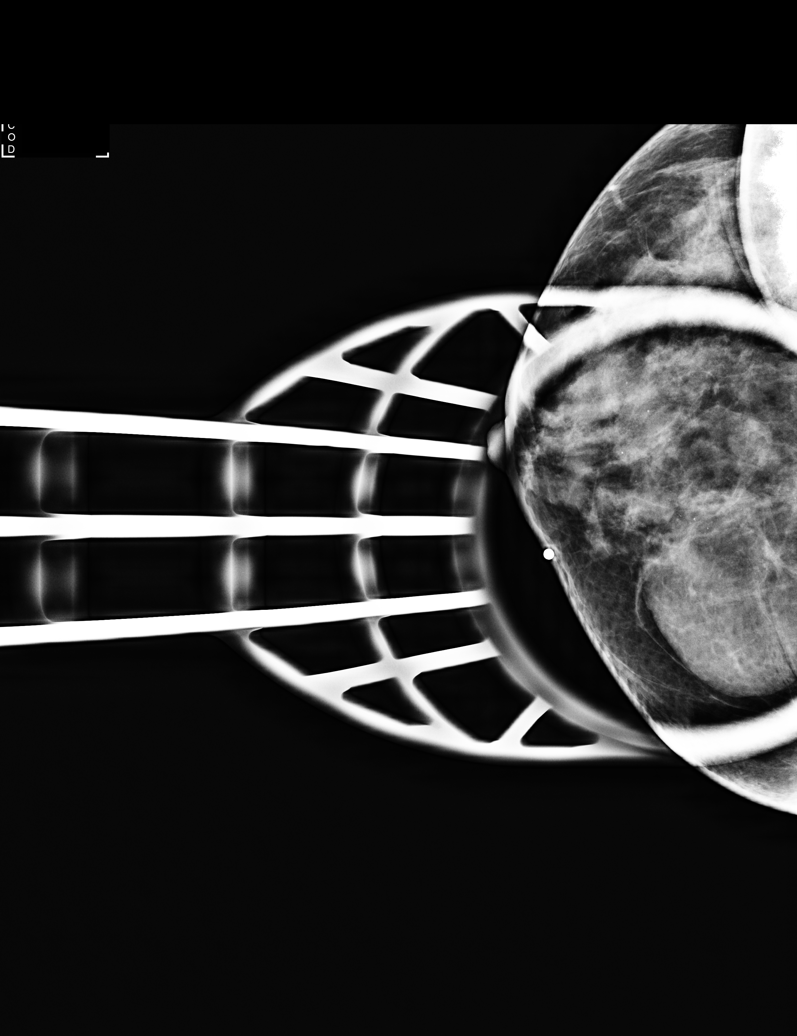

[L MLO]
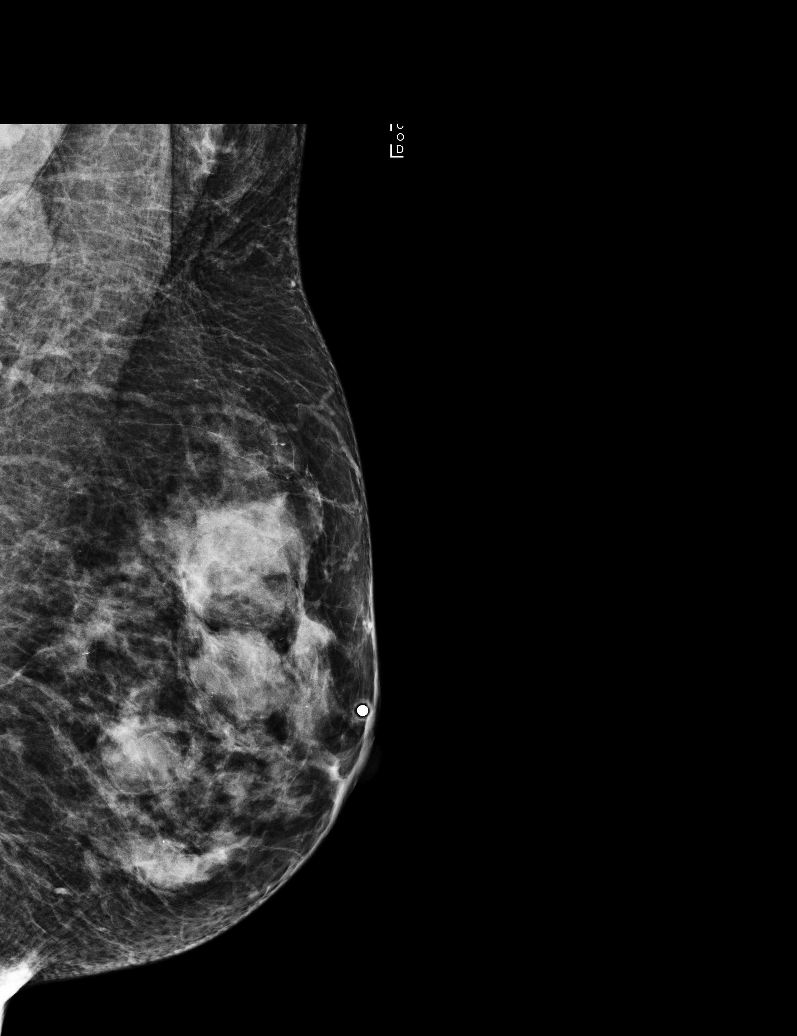

[R MLO]
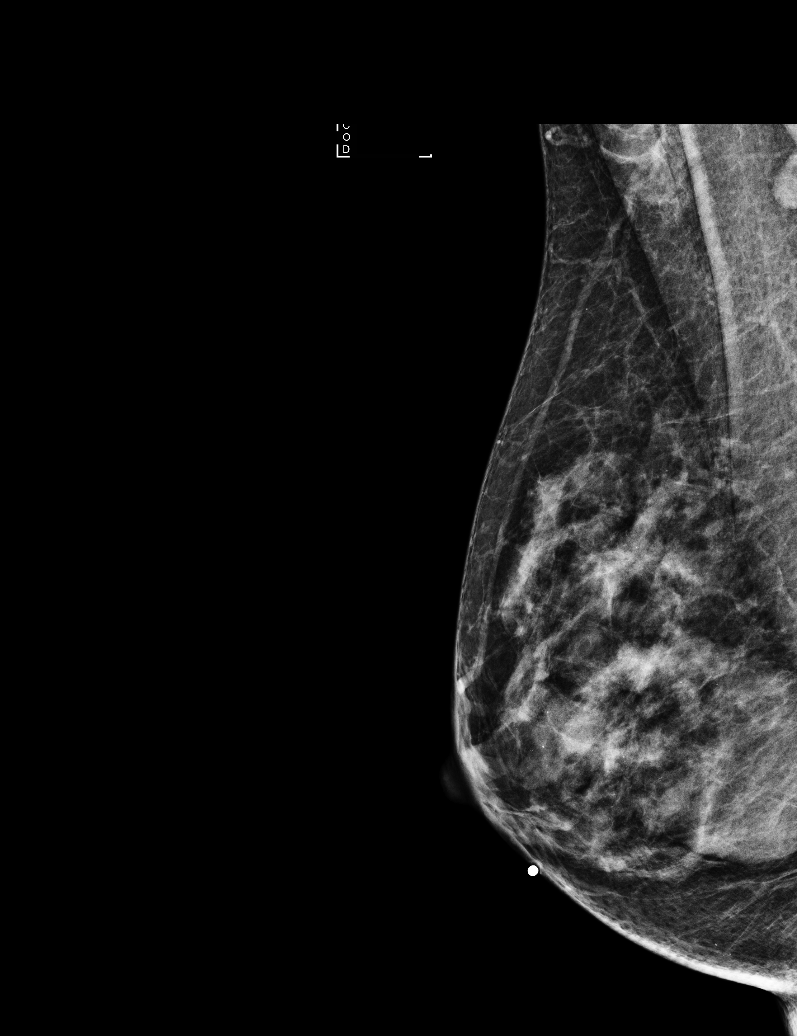

[6 of 36 positions shown; findings below may reference images not displayed]

ACR Breast Density Category c: The breast tissue is heterogeneously
dense, which may obscure small masses.
FINDINGS: There are multiple bilateral breast masses. The largest on the
right, projecting in the medial, lower inner quadrant, is smaller
and less dense than it was on the most recent prior study. This was
reportedly aspirated. Largest on the left projects in the 12 o'clock
position. There is no architectural distortion. There are no
suspicious calcifications.

Mammographic images were processed with CAD.

On physical exam, small palpable mobile masses are noted
bilaterally.

Targeted ultrasound is performed, showing on the left demonstrating
multiple cysts. The palpable abnormality corresponds to a 16 mm
retroareolar cyst. The largest cyst lies in the 12 o'clock position,
4 cm from the nipple. There are no solid masses. There are no
suspicious lesions. On the right, there also multiple cysts. The
palpable abnormality corresponds to a small relatively superficial
cyst at 6 o'clock, 2 cm the nipple, measuring 7 mm x 5 mm x 6 mm.
The largest cyst lies at the [DATE] position, 3 cm the nipple,
measuring 4.1 cm x 1 cm x 3.5 cm. There are no solid masses or
suspicious lesions.
IMPRESSION: Benign bilateral breast cysts.  No evidence of malignancy.

RECOMMENDATION:
Screening mammogram in one year.(Code:[M5]).

Patient should be considered for high risk screening MRI given her
family history and dense fibroglandular tissue on mammography as
well as are multiple cysts, which limits the sensitivity of
mammography.

I have discussed the findings and recommendations with the patient.
Results were also provided in writing at the conclusion of the
visit. If applicable, a reminder letter will be sent to the patient
regarding the next appointment.

BI-RADS CATEGORY  2: Benign.

## 2015-01-03 ENCOUNTER — Encounter: Payer: Self-pay | Admitting: Family Medicine

## 2015-01-03 DIAGNOSIS — R109 Unspecified abdominal pain: Secondary | ICD-10-CM | POA: Insufficient documentation

## 2015-01-03 DIAGNOSIS — R1084 Generalized abdominal pain: Secondary | ICD-10-CM

## 2015-02-02 ENCOUNTER — Encounter: Payer: Self-pay | Admitting: Physician Assistant

## 2015-02-02 ENCOUNTER — Ambulatory Visit (INDEPENDENT_AMBULATORY_CARE_PROVIDER_SITE_OTHER): Payer: BC Managed Care – PPO | Admitting: Physician Assistant

## 2015-02-02 VITALS — BP 118/74 | HR 68 | Temp 98.0°F | Resp 16 | Ht 67.0 in | Wt 146.0 lb

## 2015-02-02 DIAGNOSIS — I10 Essential (primary) hypertension: Secondary | ICD-10-CM

## 2015-02-02 DIAGNOSIS — H8110 Benign paroxysmal vertigo, unspecified ear: Secondary | ICD-10-CM

## 2015-02-02 LAB — BASIC METABOLIC PANEL WITH GFR
BUN: 14 mg/dL (ref 6–23)
CALCIUM: 9.3 mg/dL (ref 8.4–10.5)
CHLORIDE: 102 meq/L (ref 96–112)
CO2: 29 meq/L (ref 19–32)
Creat: 0.81 mg/dL (ref 0.50–1.10)
GFR, Est Non African American: 84 mL/min
GLUCOSE: 79 mg/dL (ref 70–99)
POTASSIUM: 4 meq/L (ref 3.5–5.3)
Sodium: 142 mEq/L (ref 135–145)

## 2015-02-02 NOTE — Progress Notes (Signed)
Patient ID: Autumn Patel MRN: [id number], DOB: July 27, 1961, 54 y.o. Date of Encounter: 02/02/2015, 1:33 PM    Chief Complaint:  Chief Complaint  Patient presents with  . Vertigo    Had an attack about 3 weeks ago and just wants it checked out     HPI: 54 y.o. year old white female reports that 3 weeks ago she had her first episode ever of any type of vertigo. Says that it was on a Saturday so she called here and spoke to whichever provider was on call that day. Says at that time they called in meclizine but recommended she come in for follow-up office visit. Says that with that episode, when she got up from bed to go to the bathroom she was stumbling. Says then she developed severe spinning/vertigo. Says "everything was spinning really bad"-- "whether her eyes were open or eyes were shut". Says that she had vomiting. Says symptoms went on all day.  Symptoms increased, decreased with certain movements. She would lay on her left side and get relief but if she turned to the right the symptoms worsened. Once she called provider and got the meclizine and took that medication, symptoms improved. States that the next day she just felt kind of tired but had no further vertigo.  States that she is getting ready to go [place] a [place] to [place] and will be flying and thought she should come in for evaluation because she wasn't sure if the flying may affect this. Also says that when she had called on that Saturday the provider told her to have a follow-up visit so she felt that she should come for checkup before she goes out of town.     Home Meds:   Outpatient Prescriptions Prior to Visit  Medication Sig Dispense Refill  . amLODipine (NORVASC) 5 MG tablet TAKE 1 TABLET BY MOUTH DAILY 30 tablet 2  . clonazePAM (KLONOPIN) 0.5 MG tablet Take 1 tablet (0.5 mg total) by mouth 3 (three) times daily as needed for anxiety. 90 tablet 0  . fluticasone (FLONASE) 50 MCG/ACT nasal spray Place 2 sprays into both  nostrils daily. Each nostril once daily 1 g 2  . levonorgestrel-ethinyl estradiol (ENPRESSE,TRIVORA) tablet Take 1 tablet by mouth daily.    Marland Kitchen. Lysine 500 MG TABS Take 500 mg by mouth daily.    . Multiple Vitamins-Minerals (MULTIVITAMIN PO) Take by mouth. [facility] daily mvi with iron and 500mg  calcium plus zinc & folic acid    . triamterene-hydrochlorothiazide (MAXZIDE-25) 37.5-25 MG per tablet Take 1 tablet by mouth daily.     Marland Kitchen. escitalopram (LEXAPRO) 10 MG tablet Take 1 tablet (10 mg total) by mouth daily. 30 tablet 3   No facility-administered medications prior to visit.    Allergies: No Known Allergies    Review of Systems: See HPI for pertinent ROS. All other ROS negative.    Physical Exam: Blood pressure 118/74, pulse 68, temperature 98 F (36.7 C), temperature source Oral, resp. rate 16, height 5\' 7"  (1.702 m), weight 146 lb (66.225 kg)., Body mass index is 22.86 kg/(m^2). General: WNWD WF.  Appears in no acute distress. HEENT: Normocephalic, atraumatic, eyes without discharge, sclera non-icteric, nares are without discharge. Bilateral auditory canals clear, TM's are without perforation, pearly grey and translucent with reflective cone of light bilaterally..  Neck: Supple. No thyromegaly. No lymphadenopathy. Lungs: Clear bilaterally to auscultation without wheezes, rales, or rhonchi. Breathing is unlabored. Heart: Regular rhythm. No murmurs, rubs, or gallops. Msk:  Strength and tone normal for age. Extremities/Skin: Warm and dry.  Neuro: Alert and oriented X 3. Moves all extremities spontaneously. Gait is normal. CNII-XII grossly in tact.Romberg normal.  Psych:  Responds to questions appropriately with a normal affect.     ASSESSMENT AND PLAN:  54 y.o. year old female with  1. Essential hypertension She is on blood pressure medications including triamterene HCTZ. Has not had recent lab work. Check that while she is here. - BASIC METABOLIC PANEL WITH GFR  2. Benign paroxysmal  positional vertigo, unspecified laterality I discussed the pathophysiology and mechanism of this. discussed the treatment of this. At this time she simply needs to use the meclizine if she has another episode of symptoms. If symptoms are not controlled with this or do not resolve within 5 days, then needs to f/u.    Signed, [address] [place], [place], [facility] 02/02/2015 1:33 PM

## 2015-02-03 ENCOUNTER — Encounter: Payer: Self-pay | Admitting: Family Medicine

## 2015-05-19 ENCOUNTER — Encounter: Payer: Self-pay | Admitting: Family Medicine

## 2015-05-19 ENCOUNTER — Telehealth: Payer: Self-pay | Admitting: Family Medicine

## 2015-05-19 ENCOUNTER — Ambulatory Visit (INDEPENDENT_AMBULATORY_CARE_PROVIDER_SITE_OTHER): Payer: BC Managed Care – PPO | Admitting: Family Medicine

## 2015-05-19 ENCOUNTER — Ambulatory Visit
Admission: RE | Admit: 2015-05-19 | Discharge: 2015-05-19 | Disposition: A | Discharge status: Home / Self Care | Payer: BC Managed Care – PPO | Source: Ambulatory Visit | Attending: Family Medicine | Admitting: Family Medicine

## 2015-05-19 VITALS — BP 114/80 | HR 84 | Temp 98.1°F | Resp 18 | Wt 144.0 lb

## 2015-05-19 DIAGNOSIS — Z23 Encounter for immunization: Secondary | ICD-10-CM | POA: Diagnosis not present

## 2015-05-19 DIAGNOSIS — M79604 Pain in right leg: Secondary | ICD-10-CM | POA: Diagnosis not present

## 2015-05-19 IMAGING — CR DG TIBIA/FIBULA 2V*R*
4 series · 4 of 4 positions shown · non-contrast
Comparison: None.

CLINICAL DATA: 54-year-old with acute onset of lateral left lower
leg pain from the knee to the ankle which began a few days ago. No
known injuries.

EXAM:
RIGHT TIBIA AND FIBULA - 2 VIEW

[view not recorded (1 of 4)]
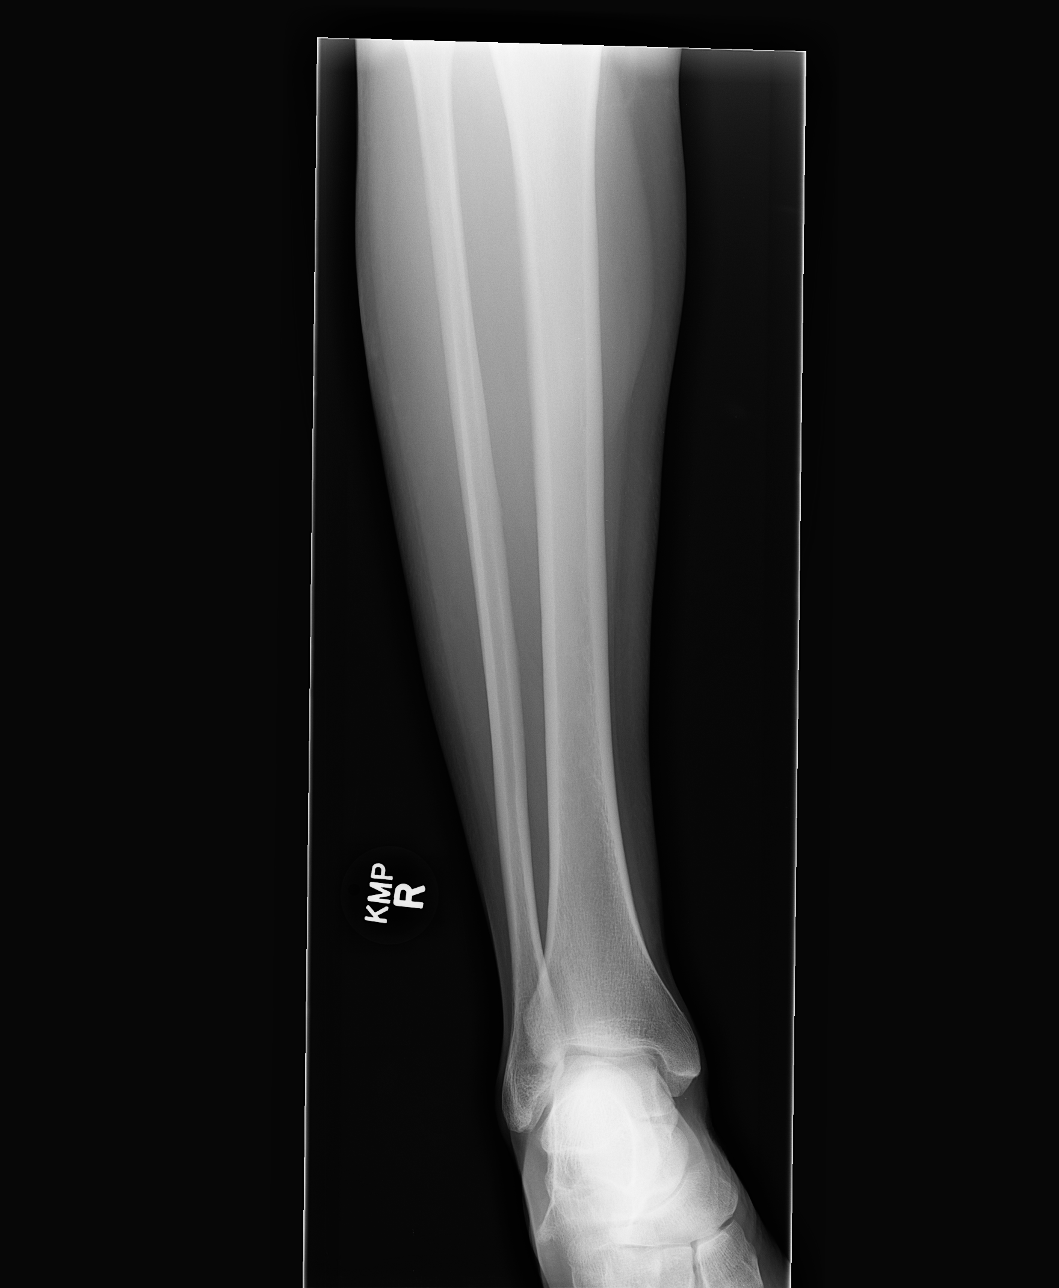

[view not recorded (2 of 4)]
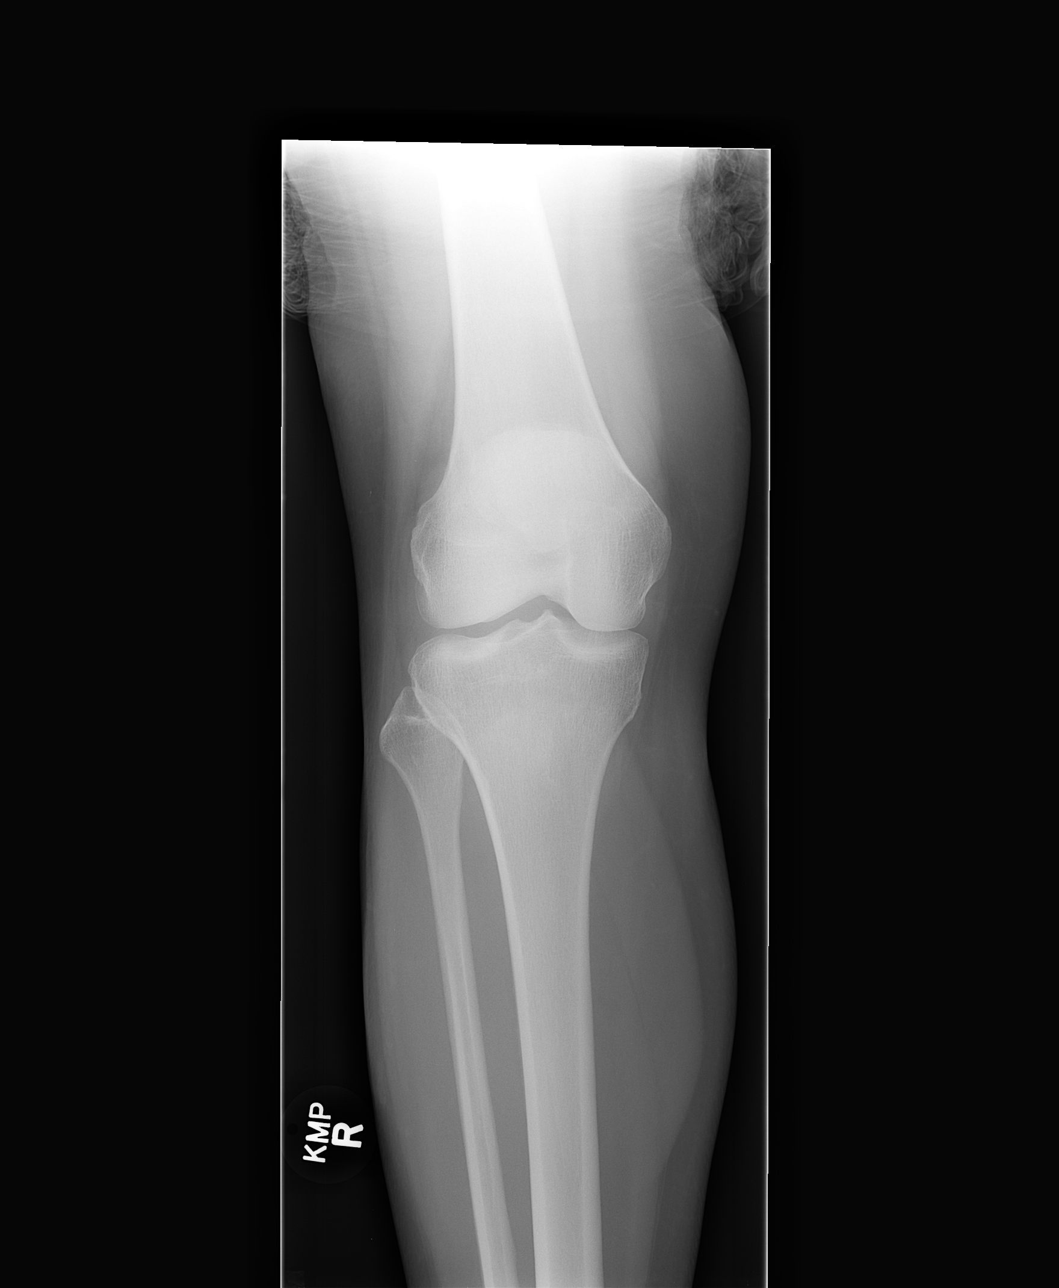

[view not recorded (3 of 4)]
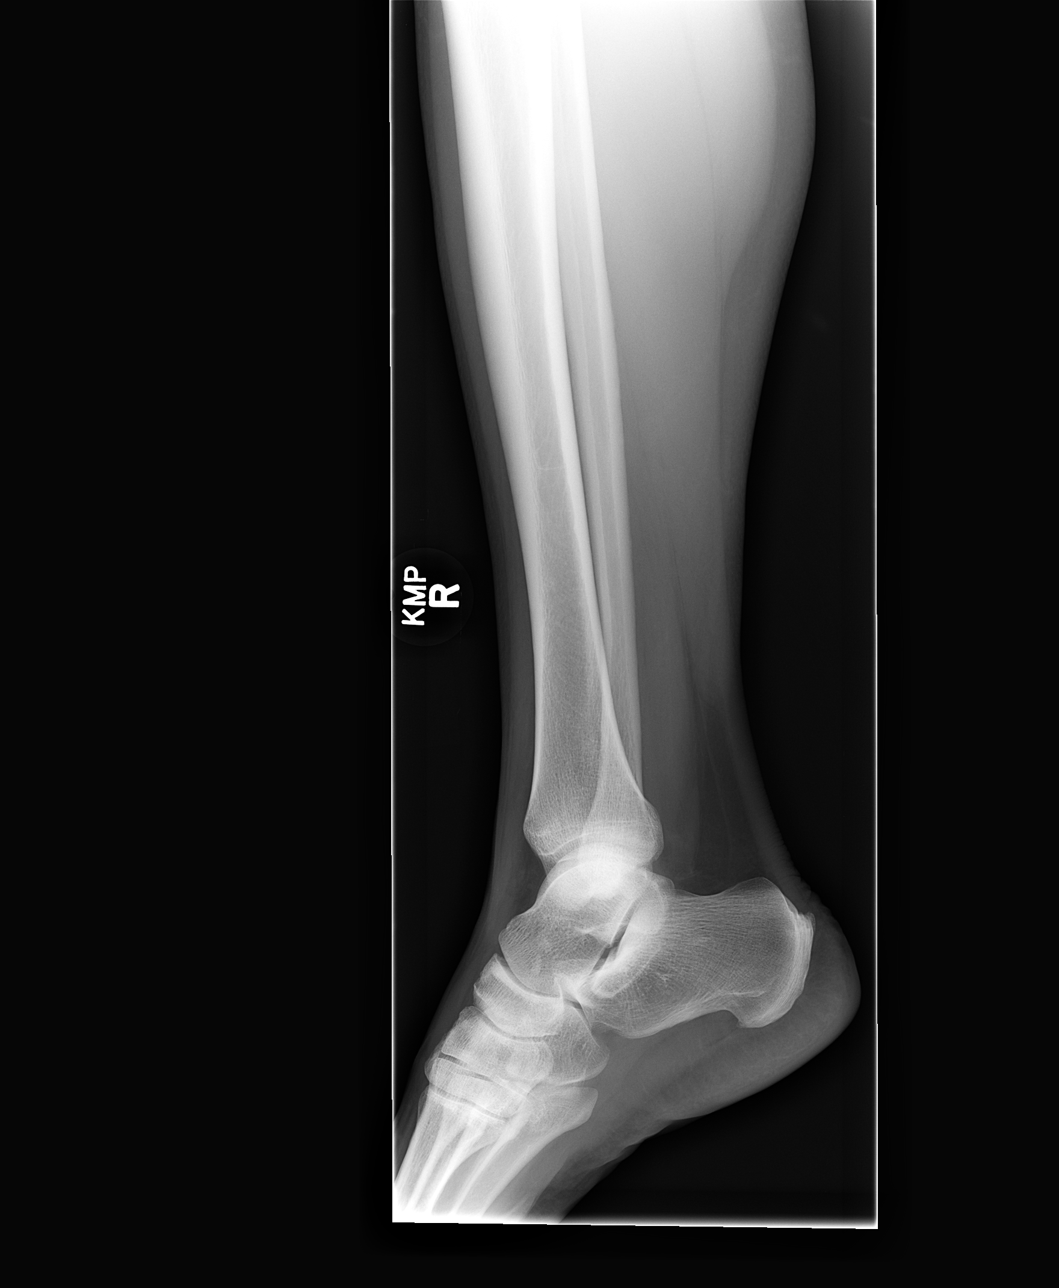

[view not recorded (4 of 4)]
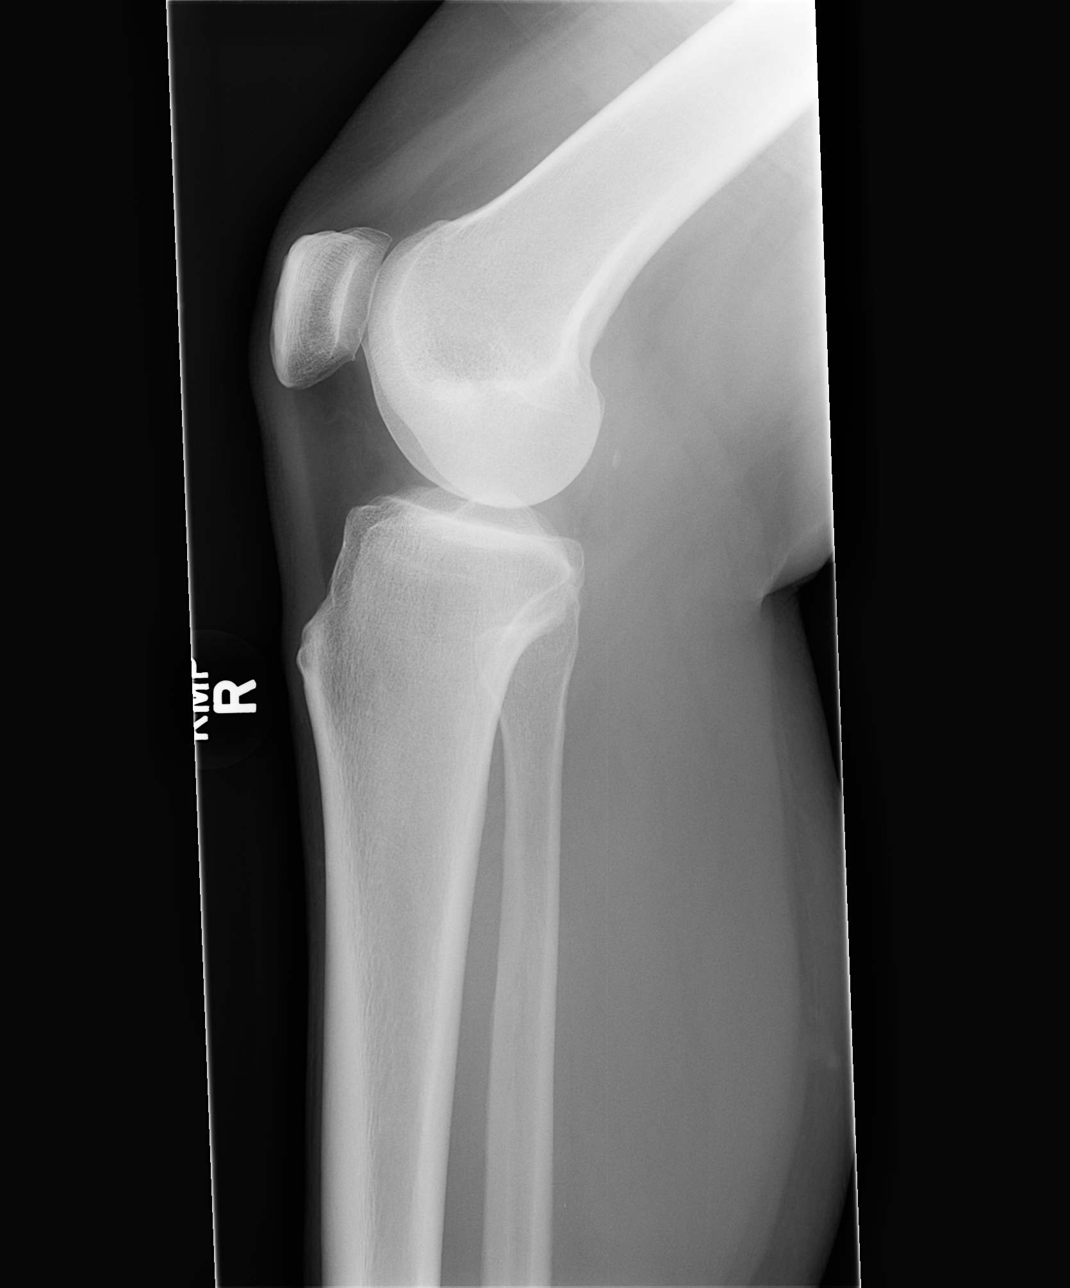

[4 of 4 positions shown; findings below may reference images not displayed]

FINDINGS: No acute fracture involving the tibial or fibula. Well preserved
bone mineral density. No intrinsic osseous abnormality. Visualized
knee joint and ankle joint intact.
IMPRESSION: Normal examination.

## 2015-05-19 MED ORDER — GABAPENTIN 300 MG PO CAPS: 300.0000 mg | ORAL_CAPSULE | Freq: Three times a day (TID) | ORAL | Status: DC

## 2015-05-19 NOTE — Progress Notes (Signed)
Subjective:    Patient ID: Autumn Patel, female    DOB: [birth year], 54 y.o.   MRN: [id number]  HPI 07/25/14 Please see my last office visit. At that time the patient was having a difficult time sleeping as well as anxiety attacks. Her daughter was suffering with depression and even attempted suicide. Patient is also going through a difficult time in her marriage. They have contemplated a divorce. With all the social stress that the patient is going through, her anxiety and depression have worsened. She now deals with nightly insomnia. She reports poor energy. She has poor concentration. She has no appetite. She has lost almost 30 pounds over the last year unintentionally. She continues to need the Klonopin during the day to help her relax. She is also trying to use the medication at night to help her sleep. She is worried about becoming dependent on the medication. She would like to try medication to help manage the symptoms with less chance of dependency.  At that time, my plan was: Patient denies any suicidal ideation. I do believe she is suffering from depression and situational anxiety. I will start the patient on Lexapro 10 mg by mouth daily. Recheck in one month. Meanwhile she can use Klonopin 1-2 tablets during the day as needed for anxiety and 1-2 tablets as needed at night for insomnia. Recheck in one month if no better or immediately if worse.  05/19/15 Patient reports several months of pain in her right leg. The pain is distal to her knee radiates down the posterior lateral aspect of her calf into the dorsum of her right foot into her toes. She describes the pain as a deep ache. Certain positions make it worse such as crossing her legs. She denies any injury to the leg. She denies any knee pain or ankle pain. She denies any swelling in the legs. There is no erythema in her leg. There is no warmth. There are no varicose veins. She has normal pulses in the dorsalis pedis, posterior tibialis,  and the popliteal artery. She has a negative Homans sign. The pain is unusual in that it is a deep, relentless ache. She denies any numbness or tingling. She denies any current back pain although she does have a history of back issues. Past Medical History  Diagnosis Date  . Hypertension   . Allergy    Past Surgical History  Procedure Laterality Date  . Cholecystectomy    . Cesarean section     Current Outpatient Prescriptions on File Prior to Visit  Medication Sig Dispense Refill  . amLODipine (NORVASC) 5 MG tablet TAKE 1 TABLET BY MOUTH DAILY 30 tablet 2  . clonazePAM (KLONOPIN) 0.5 MG tablet Take 1 tablet (0.5 mg total) by mouth 3 (three) times daily as needed for anxiety. 90 tablet 0  . fluticasone (FLONASE) 50 MCG/ACT nasal spray Place 2 sprays into both nostrils daily. Each nostril once daily 1 g 2  . levonorgestrel-ethinyl estradiol (ENPRESSE,TRIVORA) tablet Take 1 tablet by mouth daily.    Marland Kitchen Lysine 500 MG TABS Take 500 mg by mouth daily.    . meclizine (ANTIVERT) 25 MG tablet TAKE 1 TABLET BY MOUTH EVERY 8 HOURS AS NEEDED FOR DIZZINESS  0  . Multiple Vitamins-Minerals (MULTIVITAMIN PO) Take by mouth. [facility] daily mvi with iron and  calcium plus zinc & folic acid    . triamterene-hydrochlorothiazide (MAXZIDE-25) 37.5-25 MG per tablet Take 1 tablet by mouth daily.      No current facility-administered  medications on file prior to visit.   No Known Allergies Social History   Social History  . Marital Status: Married    Spouse Name: N/A  . Number of Children: N/A  . Years of Education: N/A   Occupational History  . Not on file.   Social History Main Topics  . Smoking status: Never Smoker   . Smokeless tobacco: Never Used  . Alcohol Use: No  . Drug Use: No  . Sexual Activity: Yes     Comment: married   Other Topics Concern  . Not on file   Social History Narrative      Review of Systems  All other systems reviewed and are negative.      Objective:    Physical Exam  Constitutional: She is oriented to person, place, and time.  Cardiovascular: Normal rate, regular rhythm and normal heart sounds.   Pulmonary/Chest: Effort normal and breath sounds normal.  Abdominal: Soft. Bowel sounds are normal.  Musculoskeletal: Normal range of motion. She exhibits no edema or tenderness.       Right knee: Normal.       Right ankle: Normal.       Right foot: Normal.  Neurological: She is alert and oriented to person, place, and time. She has normal reflexes. She displays normal reflexes. No cranial nerve deficit. She exhibits normal muscle tone. Coordination normal.  Psychiatric: She has a normal mood and affect. Her behavior is normal. Judgment and thought content normal.  Vitals reviewed.         Assessment & Plan:  Need for prophylactic vaccination and inoculation against influenza - Plan: Flu Vaccine QUAD 36+ mos IM  Right leg pain - Plan: DG Tibia/Fibula Right, gabapentin (NEURONTIN) 300 MG capsule  I suspect her right leg pain is secondary to neuropathic pain. I suspect lumbar radiculopathy or possible peripheral neuropathy. I will schedule the patient for an x-ray of the right tibia and fibula to rule out bone metastases, or malignancies in the bone, Paget's disease, etc. I suspect the x-rays will be normal. X-rays are normal, I would like the patient to try gabapentin 300 mg by mouth 3 times a day. If the pain continues and does not improve on the gabapentin, I will schedule the patient for nerve conduction studies of the right leg.. I see no evidence of peripheral vascular disease. I do not expect this to be a blood clot given the lack of edema in the leg, the lack of swelling in the leg, the lack of erythema in the leg, and the duration of several months.

## 2015-05-19 NOTE — Telephone Encounter (Signed)
Patient calling to let us know when xray results come in she would like a call back at this number [phone]

## 2015-05-19 NOTE — Telephone Encounter (Signed)
Pt informed of normal xray results

## 2015-05-29 ENCOUNTER — Telehealth: Payer: Self-pay | Admitting: Family Medicine

## 2015-05-29 NOTE — Telephone Encounter (Signed)
Patient calling to discuss gabapentin with you  [phone]

## 2015-05-30 NOTE — Telephone Encounter (Signed)
Pt was not taking medication tid every day and still having leg pain - informed pt to take every day tid for 1 week and if the pain gets better then she can continue to take as prescribed and if not then we will schedule her for the NCS.

## 2015-06-15 ENCOUNTER — Telehealth: Payer: Self-pay | Admitting: Family Medicine

## 2015-06-15 NOTE — Telephone Encounter (Signed)
Pt is calling to speak with someone regarding her nerve pain. Please call  [phone]

## 2015-06-15 NOTE — Telephone Encounter (Signed)
I don't think so.  The pain in your leg is likely nerve pain due to a pinched nerve in your back.  I do not feel there would be any relationship to the situation in your hand.  Call me if the symptoms worsen and we could get nerve conduction studies.

## 2015-06-15 NOTE — Telephone Encounter (Signed)
Called and spoke to pt and she states that her leg pain is much improved on the medication however you had mentioned in the LOV asking her if she has had any numbness or tingling. At the time she did not think anything of it but over the last several months she has noticed that her pen will fall out of her hand while filing for no apparent reason. No other time does this happen. She was wondering if this is anything to be concern about?

## 2015-06-16 NOTE — Telephone Encounter (Signed)
Pt aware of below recommendations

## 2015-06-16 NOTE — Telephone Encounter (Signed)
LMTRC on cell # - no answer and no vm on work #

## 2015-08-09 ENCOUNTER — Ambulatory Visit: Payer: BC Managed Care – PPO | Admitting: Physician Assistant

## 2015-12-05 ENCOUNTER — Ambulatory Visit (INDEPENDENT_AMBULATORY_CARE_PROVIDER_SITE_OTHER): Payer: BC Managed Care – PPO | Admitting: Family Medicine

## 2015-12-05 ENCOUNTER — Encounter: Payer: Self-pay | Admitting: Family Medicine

## 2015-12-05 VITALS — BP 118/74 | HR 80 | Temp 97.9°F | Resp 16 | Ht 67.0 in | Wt 155.0 lb

## 2015-12-05 DIAGNOSIS — R509 Fever, unspecified: Secondary | ICD-10-CM

## 2015-12-05 LAB — COMPLETE METABOLIC PANEL WITH GFR
ALT: 13 U/L (ref 6–29)
AST: 20 U/L (ref 10–35)
Albumin: 3.9 g/dL (ref 3.6–5.1)
Alkaline Phosphatase: 64 U/L (ref 33–130)
BUN: 12 mg/dL (ref 7–25)
CHLORIDE: 100 mmol/L (ref 98–110)
CO2: 26 mmol/L (ref 20–31)
Calcium: 8.7 mg/dL (ref 8.6–10.4)
Creat: 0.76 mg/dL (ref 0.50–1.05)
GLUCOSE: 106 mg/dL — AB (ref 70–99)
POTASSIUM: 3.7 mmol/L (ref 3.5–5.3)
SODIUM: 135 mmol/L (ref 135–146)
Total Bilirubin: 0.3 mg/dL (ref 0.2–1.2)
Total Protein: 6.6 g/dL (ref 6.1–8.1)

## 2015-12-05 LAB — INFLUENZA A AND B AG, IMMUNOASSAY
Influenza A Antigen: NOT DETECTED
Influenza B Antigen: NOT DETECTED

## 2015-12-05 LAB — CBC WITH DIFFERENTIAL/PLATELET
BASOS ABS: 39 {cells}/uL (ref 0–200)
BASOS PCT: 1 %
EOS ABS: 0 {cells}/uL — AB (ref 15–500)
EOS PCT: 0 %
HCT: 38.5 % (ref 35.0–45.0)
Hemoglobin: 13 g/dL (ref 12.0–15.0)
LYMPHS PCT: 25 %
Lymphs Abs: 975 cells/uL (ref 850–3900)
MCH: 28.6 pg (ref 27.0–33.0)
MCHC: 33.8 g/dL (ref 32.0–36.0)
MCV: 84.8 fL (ref 80.0–100.0)
MONOS PCT: 10 %
MPV: 9.2 fL (ref 7.5–12.5)
Monocytes Absolute: 390 cells/uL (ref 200–950)
NEUTROS ABS: 2496 {cells}/uL (ref 1500–7800)
Neutrophils Relative %: 64 %
PLATELETS: 250 10*3/uL (ref 140–400)
RBC: 4.54 MIL/uL (ref 3.80–5.10)
RDW: 14.4 % (ref 11.0–15.0)
WBC: 3.9 10*3/uL (ref 3.8–10.8)

## 2015-12-05 MED ORDER — DOXYCYCLINE HYCLATE 100 MG PO TABS: 100.0000 mg | ORAL_TABLET | Freq: Two times a day (BID) | ORAL | Status: DC

## 2015-12-05 NOTE — Progress Notes (Signed)
Subjective:    Patient ID: Autumn Patel, female    DOB: 06-28-1961, 55 y.o.   MRN: [id number]  HPI Patient has a 2 day history of high fever up to 102.1. She also reports diffuse body aches including primarily in her hips and her knees and in her lower back. She also has a dull headache. However she denies any symptoms of a virus. She denies any rhinorrhea. She denies any sore throat. She denies any cough nausea vomiting or diarrhea. She denies any rash. She has been outside recently although she denies any known tick bite. However her myalgias and arthralgias are out of proportion to what one would expect from a simple virus. Flu test is checked today she has no known flu exposure. Past Medical History  Diagnosis Date  . Hypertension   . Allergy    Past Surgical History  Procedure Laterality Date  . Cholecystectomy    . Cesarean section     Current Outpatient Prescriptions on File Prior to Visit  Medication Sig Dispense Refill  . amLODipine (NORVASC) 5 MG tablet TAKE 1 TABLET BY MOUTH DAILY 30 tablet 2  . clonazePAM (KLONOPIN) 0.5 MG tablet Take 1 tablet (0.5 mg total) by mouth 3 (three) times daily as needed for anxiety. 90 tablet 0  . fluticasone (FLONASE) 50 MCG/ACT nasal spray Place 2 sprays into both nostrils daily. Each nostril once daily 1 g 2  . gabapentin (NEURONTIN) 300 MG capsule Take 1 capsule (300 mg total) by mouth 3 (three) times daily. 90 capsule 3  . levonorgestrel-ethinyl estradiol (ENPRESSE,TRIVORA) tablet Take 1 tablet by mouth daily.    Marland Kitchen. Lysine 500 MG TABS Take 500 mg by mouth daily.    . meclizine (ANTIVERT) 25 MG tablet TAKE 1 TABLET BY MOUTH EVERY 8 HOURS AS NEEDED FOR DIZZINESS  0  . Multiple Vitamins-Minerals (MULTIVITAMIN PO) Take by mouth. [facility] daily mvi with iron and 500mg  calcium plus zinc & folic acid    . triamterene-hydrochlorothiazide (MAXZIDE-25) 37.5-25 MG per tablet Take 1 tablet by mouth daily.     Marland Kitchen. zolpidem (AMBIEN) 5 MG tablet Take 5 mg by mouth  at bedtime as needed.  3   No current facility-administered medications on file prior to visit.   No Known Allergies Social History   Social History  . Marital Status: Married    Spouse Name: N/A  . Number of Children: N/A  . Years of Education: N/A   Occupational History  . Not on file.   Social History Main Topics  . Smoking status: Never Smoker   . Smokeless tobacco: Never Used  . Alcohol Use: No  . Drug Use: No  . Sexual Activity: Yes     Comment: married   Other Topics Concern  . Not on file   Social History Narrative      Review of Systems  All other systems reviewed and are negative.      Objective:   Physical Exam  Constitutional: She appears well-developed and well-nourished. No distress.  HENT:  Right Ear: External ear normal.  Left Ear: External ear normal.  Nose: Nose normal.  Mouth/Throat: Oropharynx is clear and moist. No oropharyngeal exudate.  Eyes: Conjunctivae are normal.  Neck: Neck supple.  Cardiovascular: Normal rate, regular rhythm and normal heart sounds.   Pulmonary/Chest: Effort normal and breath sounds normal. No respiratory distress. She has no wheezes. She has no rales.  Abdominal: Soft. Bowel sounds are normal. She exhibits no distension. There is no  tenderness. There is no rebound.  Musculoskeletal: She exhibits no edema.  Lymphadenopathy:    She has no cervical adenopathy.  Skin: No rash noted. She is not diaphoretic. No erythema.  Vitals reviewed.         Assessment & Plan:  Fever and chills - Plan: Influenza A and B Ag, Immunoassay  Flu test is negative, given the time of year, given the lack of supporting symptoms, I feel necessary to cover her for possible tickborne illness with doxycycline 100 mg by mouth twice a day for 10 days. Recheck later this week if no better or immediately if worse. I believe is too soon to check tick titers given the fact she is only had symptoms for 2 days.

## 2015-12-12 LAB — CULTURE, BLOOD (SINGLE): ORGANISM ID, BACTERIA: NO GROWTH

## 2016-04-17 ENCOUNTER — Ambulatory Visit (INDEPENDENT_AMBULATORY_CARE_PROVIDER_SITE_OTHER): Payer: BC Managed Care – PPO | Admitting: Family Medicine

## 2016-04-17 VITALS — BP 118/64 | HR 78 | Temp 98.1°F | Resp 14 | Wt 164.5 lb

## 2016-04-17 DIAGNOSIS — L259 Unspecified contact dermatitis, unspecified cause: Secondary | ICD-10-CM

## 2016-04-17 DIAGNOSIS — G4700 Insomnia, unspecified: Secondary | ICD-10-CM

## 2016-04-17 DIAGNOSIS — F5104 Psychophysiologic insomnia: Secondary | ICD-10-CM

## 2016-04-17 MED ORDER — PREDNISONE 10 MG PO TABS: ORAL_TABLET | ORAL | 0 refills | Status: DC

## 2016-04-17 MED ORDER — METHYLPREDNISOLONE ACETATE 40 MG/ML IJ SUSP
40.0000 mg | Freq: Once | INTRAMUSCULAR | Status: AC
  Administered 2016-04-17: 40 mg via INTRAMUSCULAR

## 2016-04-17 MED ORDER — ZOLPIDEM TARTRATE 10 MG PO TABS: 10.0000 mg | ORAL_TABLET | Freq: Every evening | ORAL | 1 refills | Status: DC | PRN

## 2016-04-17 NOTE — Patient Instructions (Signed)
Take prednisone starting tomorrow Take benadryl at bedtime as needed  Okay to use benadryl cream  F/U as needed

## 2016-04-17 NOTE — Progress Notes (Signed)
   Subjective:    Patient ID: Autumn ClickMaria L Patel, female    DOB: Jan 19, 1961, 55 y.o.   MRN: [id number]  Patient presents for Rash (Rash on arms and lower back)   Pt here with itchy rash that started a few days ago, no sick contacts, no known allergic contacts, no new foods, change in skin care/wash routine . Started on left forearm she has scratched so much feels she is spreading it, now on lower back, upper buttocks and neck. Used topical benadryl cream with minimal improvement  No fever, no chills.   On med review has problems getting enough ambien from insurance only allow 15 per month  Review Of Systems:  GEN- denies fatigue, fever, weight loss,weakness, recent illness HEENT- denies eye drainage, change in vision, nasal discharge, CVS- denies chest pain, palpitations RESP- denies SOB, cough, wheeze ABD- denies N/V, change in stools, abd pain GU- denies dysuria, hematuria, dribbling, incontinence MSK- denies joint pain, muscle aches, injury Neuro- denies headache, dizziness, syncope, seizure activity       Objective:    BP 118/64   Pulse 78   Temp 98.1 F (36.7 C) (Oral)   Resp 14   Wt 164 lb 8 oz (74.6 kg)   BMI 25.76 kg/m  GEN- NAD, alert and oriented x3 HEENT- PERRL, EOMI, non injected sclera, pink conjunctiva, MMM, oropharynx clear Neck- Supple, no LAD  CVS- RRR, no murmur RESP-CTAB Skin- erythematous patchy raised rash with excoriations across lower back at nape of neck, left forearm EXT- No edema Pulses- Radial 2+        Assessment & Plan:      Problem List Items Addressed This Visit    Chronic insomnia    Given AMbien 10mg ,she can cut in half as needed        Other Visit Diagnoses    Contact dermatitis    -  Primary   unknown contact dermatitis based on apperance, treat with Depo Medrol and steroid taper, benadryl at bedtime    Relevant Medications   methylPREDNISolone acetate (DEPO-MEDROL) injection 40 mg (Completed)      Note: This dictation was  prepared with Dragon dictation along with smaller phrase technology. Any transcriptional errors that result from this process are unintentional.

## 2016-04-18 ENCOUNTER — Encounter: Payer: Self-pay | Admitting: Family Medicine

## 2016-04-18 DIAGNOSIS — F5104 Psychophysiologic insomnia: Secondary | ICD-10-CM | POA: Insufficient documentation

## 2016-04-18 NOTE — Assessment & Plan Note (Signed)
Given AMbien 10mg ,she can cut in half as needed

## 2016-04-29 ENCOUNTER — Other Ambulatory Visit: Payer: Self-pay | Admitting: Obstetrics & Gynecology

## 2016-04-30 LAB — CYTOLOGY - PAP

## 2016-09-30 ENCOUNTER — Ambulatory Visit (INDEPENDENT_AMBULATORY_CARE_PROVIDER_SITE_OTHER): Payer: BC Managed Care – PPO | Admitting: Family Medicine

## 2016-09-30 ENCOUNTER — Encounter: Payer: Self-pay | Admitting: Family Medicine

## 2016-09-30 VITALS — BP 112/70 | HR 82 | Temp 98.1°F | Resp 14 | Ht 67.0 in | Wt 171.0 lb

## 2016-09-30 DIAGNOSIS — I10 Essential (primary) hypertension: Secondary | ICD-10-CM

## 2016-09-30 DIAGNOSIS — Z0000 Encounter for general adult medical examination without abnormal findings: Secondary | ICD-10-CM | POA: Diagnosis not present

## 2016-09-30 DIAGNOSIS — R635 Abnormal weight gain: Secondary | ICD-10-CM | POA: Diagnosis not present

## 2016-09-30 LAB — COMPLETE METABOLIC PANEL WITH GFR
ALT: 13 U/L (ref 6–29)
AST: 15 U/L (ref 10–35)
Albumin: 4.1 g/dL (ref 3.6–5.1)
Alkaline Phosphatase: 72 U/L (ref 33–130)
BILIRUBIN TOTAL: 0.4 mg/dL (ref 0.2–1.2)
BUN: 14 mg/dL (ref 7–25)
CALCIUM: 9.6 mg/dL (ref 8.6–10.4)
CO2: 29 mmol/L (ref 20–31)
CREATININE: 0.73 mg/dL (ref 0.50–1.05)
Chloride: 105 mmol/L (ref 98–110)
GFR, Est Non African American: >89 mL/min (ref >=60)
Glucose, Bld: 90 mg/dL (ref 70–99)
Potassium: 3.9 mmol/L (ref 3.5–5.3)
Sodium: 141 mmol/L (ref 135–146)
TOTAL PROTEIN: 6.7 g/dL (ref 6.1–8.1)

## 2016-09-30 LAB — CBC WITH DIFFERENTIAL/PLATELET
BASOS PCT: 0 %
Basophils Absolute: 0 cells/uL (ref 0–200)
EOS ABS: 200 {cells}/uL (ref 15–500)
Eosinophils Relative: 4 %
HCT: 38.7 % (ref 35.0–45.0)
Hemoglobin: 12.7 g/dL (ref 12.0–15.0)
LYMPHS ABS: 1350 {cells}/uL (ref 850–3900)
Lymphocytes Relative: 27 %
MCH: 28 pg (ref 27.0–33.0)
MCHC: 32.8 g/dL (ref 32.0–36.0)
MCV: 85.4 fL (ref 80.0–100.0)
MPV: 8.9 fL (ref 7.5–12.5)
Monocytes Absolute: 400 cells/uL (ref 200–950)
Monocytes Relative: 8 %
NEUTROS ABS: 3050 {cells}/uL (ref 1500–7800)
Neutrophils Relative %: 61 %
Platelets: 337 10*3/uL (ref 140–400)
RBC: 4.53 MIL/uL (ref 3.80–5.10)
RDW: 14.4 % (ref 11.0–15.0)
WBC: 5 10*3/uL (ref 3.8–10.8)

## 2016-09-30 LAB — LIPID PANEL
CHOLESTEROL: 236 mg/dL — AB (ref <200)
HDL: 81 mg/dL (ref >50)
LDL CALC: 136 mg/dL — AB (ref <100)
TRIGLYCERIDES: 94 mg/dL (ref <150)
Total CHOL/HDL Ratio: 2.9 Ratio (ref <5.0)
VLDL: 19 mg/dL (ref <30)

## 2016-09-30 LAB — TSH: TSH: 2.15 mIU/L

## 2016-09-30 NOTE — Progress Notes (Signed)
Subjective:    Patient ID: Autumn Patel, female    DOB: 1961-03-31, 56 y.o.   MRN: [id number]  HPI Here for CPE.  Patient is a very pleasant 56 year old white female here today for complete physical exam. She receives her gynecologic care at her gynecologist. Pap smear was performed last year. They have always been normal. She gets this done every other year. She has a very strong family history of breast cancer and therefore she gets her mammogram every year. Her last mammogram was checked last summer. Patient's colonoscopy was performed in 2014 and was normal. Immunizations are up-to-date. Her only concern is she is experiencing some weight gain. She is not engaging in any regular aerobic exercise. She is not eating very high calorie diet however. Therefore she would like to have her thyroid checked. She also reports numbness and tingling in her right hand. Her job involves typing. Today on examination she has a positive Tinel sign and a positive Phalen sign suggesting carpal tunnel syndrome. We discussed treatment options including a cock up wrist splint, cortisone injections, and surgery. The patient would like to try the splint first. Past Medical History:  Diagnosis Date  . Allergy   . Hypertension    Past Surgical History:  Procedure Laterality Date  . CESAREAN SECTION    . CHOLECYSTECTOMY     Current Outpatient Prescriptions on File Prior to Visit  Medication Sig Dispense Refill  . amLODipine (NORVASC) 5 MG tablet TAKE 1 TABLET BY MOUTH DAILY 30 tablet 2  . meclizine (ANTIVERT) 25 MG tablet TAKE 1 TABLET BY MOUTH EVERY 8 HOURS AS NEEDED FOR DIZZINESS  0  . Multiple Vitamins-Minerals (MULTIVITAMIN PO) Take by mouth. [facility] daily mvi with iron and 500mg  calcium plus zinc & folic acid    . triamterene-hydrochlorothiazide (MAXZIDE-25) 37.5-25 MG per tablet Take 1 tablet by mouth daily.     Marland Kitchen zolpidem (AMBIEN) 10 MG tablet Take 1 tablet (10 mg total) by mouth at bedtime as needed. (Patient  not taking: Reported on 09/30/2016) 15 tablet 1   No current facility-administered medications on file prior to visit.    No Known Allergies Social History   Social History  . Marital status: Married    Spouse name: N/A  . Number of children: N/A  . Years of education: N/A   Occupational History  . Not on file.   Social History Main Topics  . Smoking status: Never Smoker  . Smokeless tobacco: Never Used  . Alcohol use No  . Drug use: No  . Sexual activity: Yes     Comment: married   Other Topics Concern  . Not on file   Social History Narrative  . No narrative on file   FH: Breast cancer in sisters Dad- CAD/MI/CVA Mom-HTN   Review of Systems  All other systems reviewed and are negative.      Objective:   Physical Exam  Constitutional: She is oriented to person, place, and time. She appears well-developed and well-nourished. No distress.  HENT:  Head: Normocephalic and atraumatic.  Right Ear: External ear normal.  Left Ear: External ear normal.  Nose: Nose normal.  Mouth/Throat: Oropharynx is clear and moist. No oropharyngeal exudate.  Eyes: Conjunctivae and EOM are normal. Pupils are equal, round, and reactive to light. Right eye exhibits no discharge. Left eye exhibits no discharge. No scleral icterus.  Neck: Normal range of motion. Neck supple. No JVD present. No tracheal deviation present. No thyromegaly present.  Cardiovascular: Normal  rate, regular rhythm, normal heart sounds and intact distal pulses.  Exam reveals no gallop and no friction rub.   No murmur heard. Pulmonary/Chest: Effort normal and breath sounds normal. No stridor. No respiratory distress. She has no wheezes. She has no rales. She exhibits no tenderness.  Abdominal: Soft. Bowel sounds are normal. She exhibits no distension and no mass. There is no tenderness. There is no rebound and no guarding.  Musculoskeletal: Normal range of motion. She exhibits no edema, tenderness or deformity.    Lymphadenopathy:    She has no cervical adenopathy.  Neurological: She is alert and oriented to person, place, and time. She has normal reflexes. She displays normal reflexes. No cranial nerve deficit. She exhibits normal muscle tone. Coordination normal.  Skin: Skin is warm. No rash noted. She is not diaphoretic. No erythema. No pallor.  Psychiatric: She has a normal mood and affect. Her behavior is normal. Judgment and thought content normal.  Vitals reviewed.         Assessment & Plan:  Essential hypertension - Plan: CBC with Differential/Platelet, COMPLETE METABOLIC PANEL WITH GFR, Lipid panel  General medical exam - Plan: CBC with Differential/Platelet, COMPLETE METABOLIC PANEL WITH GFR, Lipid panel  Weight gain - Plan: TSH  Physical exam is completely normal. Because of awakening I will check a TSH. Because of her blood pressure I will check a CMP and fasting lipid panel. As part of her physical exam I'll also check a CBC. Immunizations and cancer screening are up-to-date. Recommended a cock up wrist splint for carpal tunnel.

## 2016-10-02 ENCOUNTER — Encounter: Payer: Self-pay | Admitting: Family Medicine

## 2016-12-18 ENCOUNTER — Ambulatory Visit (INDEPENDENT_AMBULATORY_CARE_PROVIDER_SITE_OTHER): Payer: BC Managed Care – PPO | Admitting: Physician Assistant

## 2016-12-18 ENCOUNTER — Encounter: Payer: Self-pay | Admitting: Physician Assistant

## 2016-12-18 VITALS — BP 112/78 | HR 69 | Temp 97.8°F | Wt 161.8 lb

## 2016-12-18 DIAGNOSIS — H10021 Other mucopurulent conjunctivitis, right eye: Secondary | ICD-10-CM

## 2016-12-18 DIAGNOSIS — H00012 Hordeolum externum right lower eyelid: Secondary | ICD-10-CM | POA: Diagnosis not present

## 2016-12-18 MED ORDER — BACITRACIN-POLYMYXIN B 500-10000 UNIT/GM OP OINT
1.0000 | TOPICAL_OINTMENT | Freq: Three times a day (TID) | OPHTHALMIC | 0 refills | Status: DC
Start: 2016-12-18 — End: 2017-08-13

## 2016-12-18 NOTE — Progress Notes (Signed)
    Patient ID: Autumn ClickMaria L Stockinger MRN: [id number], DOB: 04/27/1961, 56 y.o. Date of Encounter: 12/18/2016, 8:21 AM    Chief Complaint:  Chief Complaint  Patient presents with  . sty in right eye     HPI: 56 y.o. year old female presents with above.   [place]. Says does no know whether she got this from snorkeling and things like that or picked up some other germ etc. Says that Monday night 12/16/16 her right eye was having drainage and the stye was red and swollen. Says that it was looking a lot worse yesterday and today is actually better. Has seen small stye along the bottom right eyelid but then a couple days ago also had drainage coming from the right eye.     Home Meds:   Outpatient Medications Prior to Visit  Medication Sig Dispense Refill  . meclizine (ANTIVERT) 25 MG tablet TAKE 1 TABLET BY MOUTH EVERY 8 HOURS AS NEEDED FOR DIZZINESS  0  . Multiple Vitamins-Minerals (MULTIVITAMIN PO) Take by mouth. [facility] daily mvi with iron and 500mg  calcium plus zinc & folic acid    . triamterene-hydrochlorothiazide (MAXZIDE-25) 37.5-25 MG per tablet Take 1 tablet by mouth daily.     Marland Kitchen. zolpidem (AMBIEN) 10 MG tablet Take 1 tablet (10 mg total) by mouth at bedtime as needed. 15 tablet 1  . amLODipine (NORVASC) 5 MG tablet TAKE 1 TABLET BY MOUTH DAILY 30 tablet 2   No facility-administered medications prior to visit.     Allergies: No Known Allergies    Review of Systems: See HPI for pertinent ROS. All other ROS negative.    Physical Exam: Blood pressure 112/78, pulse 69, temperature 97.8 F (36.6 C), temperature source Oral, weight 161 lb 12.8 oz (73.4 kg), SpO2 98 %., Body mass index is 25.34 kg/m. General:  WNWD WF Appears in no acute distress. HEENT: Left Eye: Inspection is normal.  Right Eye: There are 2 tiny styes on lower eye lid. There is mild diffuse conjunctival injection. Neck: Supple. No thyromegaly. No lymphadenopathy. Lungs: Clear bilaterally to  auscultation without wheezes, rales, or rhonchi. Breathing is unlabored. Heart: Regular rhythm. No murmurs, rubs, or gallops. Msk:  Strength and tone normal for age. Extremities/Skin: Warm and dry.  Neuro: Alert and oriented X 3. Moves all extremities spontaneously. Gait is normal. CNII-XII grossly in tact. Psych:  Responds to questions appropriately with a normal affect.     ASSESSMENT AND PLAN:  56 y.o. year old female with  1. Hordeolum externum of right lower eyelid 3 times per day she is to apply warm compress to right eye, then allow time to dry, then apply ointment. F/U if worsens or does not resolve within 5 days. - bacitracin-polymyxin b (POLYSPORIN) ophthalmic ointment; Place 1 application into the right eye 3 (three) times daily.  Dispense: 3.5 g; Refill: 0  2. Other mucopurulent conjunctivitis of right eye - bacitracin-polymyxin b (POLYSPORIN) ophthalmic ointment; Place 1 application into the right eye 3 (three) times daily.  Dispense: 3.5 g; Refill: 0   Signed, [address] Beth GrenlochDixon, [place], [facility] 12/18/2016 8:21 AM \

## 2017-08-13 ENCOUNTER — Encounter: Payer: Self-pay | Admitting: Physician Assistant

## 2017-08-13 ENCOUNTER — Encounter: Payer: Self-pay | Admitting: Family Medicine

## 2017-08-13 ENCOUNTER — Ambulatory Visit: Payer: BC Managed Care – PPO | Admitting: Physician Assistant

## 2017-08-13 ENCOUNTER — Other Ambulatory Visit: Payer: Self-pay

## 2017-08-13 VITALS — BP 110/80 | HR 76 | Temp 97.7°F | Resp 14 | Ht 67.0 in | Wt 165.6 lb

## 2017-08-13 DIAGNOSIS — B9689 Other specified bacterial agents as the cause of diseases classified elsewhere: Secondary | ICD-10-CM

## 2017-08-13 DIAGNOSIS — J988 Other specified respiratory disorders: Secondary | ICD-10-CM

## 2017-08-13 MED ORDER — HYDROCODONE-HOMATROPINE 5-1.5 MG/5ML PO SYRP: 5.0000 mL | ORAL_SOLUTION | Freq: Four times a day (QID) | ORAL | 0 refills | Status: DC | PRN

## 2017-08-13 MED ORDER — AZITHROMYCIN 250 MG PO TABS: ORAL_TABLET | ORAL | 0 refills | Status: DC

## 2017-08-13 NOTE — Progress Notes (Signed)
Patient ID: Autumn Patel MRN: [id number], DOB: 05/14/1961, 57 y.o. Date of Encounter: 08/13/2017, 10:24 AM    Chief Complaint:  Chief Complaint  Patient presents with  . Cough    x1week   . head cold  . Headache     HPI: 57 y.o. year old female presents with above.   Been having congestion in her head and nose and now also in her chest.  Going on for a full week and is not getting any better actually seems to be getting worse.  Getting no sleep between the congestion and repetitive cough.  Has been working up until today but finally decided she needed to come in for visit.  Her job does require contact with people constant.  Has had no significant sore throat.  No fevers or chills.     Home Meds:   Outpatient Medications Prior to Visit  Medication Sig Dispense Refill  . hydrochlorothiazide (HYDRODIURIL) 12.5 MG tablet Take 12.5 mg by mouth daily.    . Multiple Vitamins-Minerals (MULTIVITAMIN PO) Take by mouth. [facility] daily mvi with iron and 500mg  calcium plus zinc & folic acid    . zolpidem (AMBIEN) 10 MG tablet Take 1 tablet (10 mg total) by mouth at bedtime as needed. 15 tablet 1  . triamterene-hydrochlorothiazide (MAXZIDE-25) 37.5-25 MG per tablet Take 1 tablet by mouth daily.     . bacitracin-polymyxin b (POLYSPORIN) ophthalmic ointment Place 1 application into the right eye 3 (three) times daily. 3.5 g 0  . meclizine (ANTIVERT) 25 MG tablet TAKE 1 TABLET BY MOUTH EVERY 8 HOURS AS NEEDED FOR DIZZINESS  0   No facility-administered medications prior to visit.     Allergies: No Known Allergies    Review of Systems: See HPI for pertinent ROS. All other ROS negative.    Physical Exam: Blood pressure 110/80, pulse 76, temperature 97.7 F (36.5 C), temperature source Oral, resp. rate 14, height 5\' 7"  (1.702 m), weight 75.1 kg (165 lb 9.6 oz), SpO2 98 %., Body mass index is 25.94 kg/m. General:  WNWD WF. Appears in no acute distress. HEENT: Normocephalic, atraumatic, eyes  without discharge, sclera non-icteric, nares are without discharge. Bilateral auditory canals clear, TM's are without perforation, pearly grey and translucent with reflective cone of light bilaterally. Oral cavity moist, posterior pharynx without exudate, erythema, peritonsillar abscess.  No tenderness with percussion to frontal or maxillary sinuses bilaterally.  Neck: Supple. No thyromegaly. No lymphadenopathy. Lungs: Clear bilaterally to auscultation without wheezes, rales, or rhonchi. Breathing is unlabored. Heart: Regular rhythm. No murmurs, rubs, or gallops. Msk:  Strength and tone normal for age. Extremities/Skin: Warm and dry.  Neuro: Alert and oriented X 3. Moves all extremities spontaneously. Gait is normal. CNII-XII grossly in tact. Psych:  Responds to questions appropriately with a normal affect.     ASSESSMENT AND PLAN:  57 y.o. year old female with  1. Bacterial respiratory infection She does sound very congested with her cough. To take antibiotic and cough suppressant as directed. Note given for out of work today and tomorrow with plans for her to return to work Friday. Follow-up if symptoms do not resolve after completion of antibiotic. - azithromycin (ZITHROMAX) 250 MG tablet; Day 1: Take 2 daily. Days 2 -5: Take 1 daily.  Dispense: 6 tablet; Refill: 0 - HYDROcodone-homatropine (HYCODAN) 5-1.5 MG/5ML syrup; Take 5 mLs by mouth every 6 (six) hours as needed for cough.  Dispense: 120 mL; Refill: 0   Signed, [address]Mary Beth Silver PeakDixon, [place],  BSFM 08/13/2017 10:24 AM

## 2017-09-15 ENCOUNTER — Ambulatory Visit: Payer: BC Managed Care – PPO | Admitting: Family Medicine

## 2017-09-15 ENCOUNTER — Encounter: Payer: Self-pay | Admitting: Family Medicine

## 2017-09-15 VITALS — BP 120/80 | HR 76 | Temp 97.9°F | Resp 14 | Ht 67.0 in | Wt 171.0 lb

## 2017-09-15 DIAGNOSIS — J069 Acute upper respiratory infection, unspecified: Secondary | ICD-10-CM | POA: Diagnosis not present

## 2017-09-15 DIAGNOSIS — J029 Acute pharyngitis, unspecified: Secondary | ICD-10-CM

## 2017-09-15 NOTE — Progress Notes (Signed)
Subjective:    Patient ID: Autumn Patel, female    DOB: 05-Sep-1960, 57 y.o.   MRN: [id number]  HPI  Symptoms began gradually on Friday.  By Saturday she developed a moderate to severe sore throat, a low-grade fever of 99-100, rhinorrhea, head congestion, and nonproductive cough.  She denies any sinus pain.  She denies high fever.  She denies shortness of breath.  She denies chest pain.  She denies otalgia. Past Medical History:  Diagnosis Date  . Allergy   . Hypertension    Past Surgical History:  Procedure Laterality Date  . CESAREAN SECTION    . CHOLECYSTECTOMY     Current Outpatient Medications on File Prior to Visit  Medication Sig Dispense Refill  . hydrochlorothiazide (HYDRODIURIL) 12.5 MG tablet Take 12.5 mg by mouth daily.    . Multiple Vitamins-Minerals (MULTIVITAMIN PO) Take by mouth. [facility] daily mvi with iron and 500mg  calcium plus zinc & folic acid    . triamterene-hydrochlorothiazide (MAXZIDE-25) 37.5-25 MG per tablet Take 1 tablet by mouth daily.     Marland Kitchen. zolpidem (AMBIEN) 10 MG tablet Take 1 tablet (10 mg total) by mouth at bedtime as needed. 15 tablet 1   No current facility-administered medications on file prior to visit.    No Known Allergies Social History   Socioeconomic History  . Marital status: Married    Spouse name: Not on file  . Number of children: Not on file  . Years of education: Not on file  . Highest education level: Not on file  Social Needs  . Financial resource strain: Not on file  . Food insecurity - worry: Not on file  . Food insecurity - inability: Not on file  . Transportation needs - medical: Not on file  . Transportation needs - non-medical: Not on file  Occupational History  . Not on file  Tobacco Use  . Smoking status: Never Smoker  . Smokeless tobacco: Never Used  Substance and Sexual Activity  . Alcohol use: No  . Drug use: No  . Sexual activity: Yes    Comment: married  Other Topics Concern  . Not on file  Social History  Narrative  . Not on file     Review of Systems  All other systems reviewed and are negative.      Objective:   Physical Exam  Constitutional: She appears well-developed and well-nourished. No distress.  HENT:  Right Ear: External ear normal.  Left Ear: External ear normal.  Nose: Mucosal edema and rhinorrhea present. Right sinus exhibits no maxillary sinus tenderness and no frontal sinus tenderness. Left sinus exhibits no maxillary sinus tenderness and no frontal sinus tenderness.  Mouth/Throat: Oropharynx is clear and moist. No oropharyngeal exudate.  Eyes: Conjunctivae are normal.  Neck: Neck supple.  Cardiovascular: Normal rate, regular rhythm and normal heart sounds.  No murmur heard. Pulmonary/Chest: Effort normal and breath sounds normal. No respiratory distress. She has no wheezes. She has no rales. She exhibits no tenderness.  Abdominal: Soft. Bowel sounds are normal.  Lymphadenopathy:    She has no cervical adenopathy.  Skin: She is not diaphoretic.  Vitals reviewed.         Assessment & Plan:  Sore throat - Plan: STREP GROUP A AG, W/REFLEX TO CULT  Viral upper respiratory tract infection  Strep screen is negative for strep throat.  Symptoms are consistent with a viral upper respiratory infection.  Patient can use Hycodan 1 teaspoon every 6 hours as needed for cough.  She can use Mucinex for chest congestion.  I recommended Sudafed for rhinorrhea and head congestion along with Zyrtec for rhinorrhea and head congestion.  Anticipate gradual improvement in symptoms over the next 3-4 days.  Recheck if symptoms are worsening.

## 2017-09-17 LAB — STREP GROUP A AG, W/REFLEX TO CULT: Streptococcus, Group A Screen (Direct): NOT DETECTED

## 2017-09-17 LAB — CULTURE, GROUP A STREP
MICRO NUMBER:: 90179869
SOURCE: 0
SPECIMEN QUALITY: ADEQUATE

## 2018-01-09 ENCOUNTER — Ambulatory Visit
Admission: RE | Admit: 2018-01-09 | Discharge: 2018-01-09 | Disposition: A | Discharge status: Home / Self Care | Payer: BC Managed Care – PPO | Source: Ambulatory Visit | Attending: Family Medicine | Admitting: Family Medicine

## 2018-01-09 ENCOUNTER — Encounter: Payer: Self-pay | Admitting: Family Medicine

## 2018-01-09 ENCOUNTER — Other Ambulatory Visit: Payer: Self-pay

## 2018-01-09 ENCOUNTER — Ambulatory Visit: Payer: BC Managed Care – PPO | Admitting: Family Medicine

## 2018-01-09 VITALS — BP 126/78 | HR 90 | Temp 98.2°F | Resp 15 | Ht 67.0 in | Wt 176.2 lb

## 2018-01-09 DIAGNOSIS — J069 Acute upper respiratory infection, unspecified: Secondary | ICD-10-CM

## 2018-01-09 DIAGNOSIS — M25561 Pain in right knee: Secondary | ICD-10-CM

## 2018-01-09 DIAGNOSIS — J302 Other seasonal allergic rhinitis: Secondary | ICD-10-CM

## 2018-01-09 DIAGNOSIS — B001 Herpesviral vesicular dermatitis: Secondary | ICD-10-CM | POA: Diagnosis not present

## 2018-01-09 IMAGING — DX DG KNEE COMPLETE 4+V*R*
4 series · 4 of 4 positions shown · non-contrast
Comparison: [DATE]

CLINICAL DATA: Right knee pain for 3 months.  No known injury.

EXAM:
RIGHT KNEE - COMPLETE 4+ VIEW

[dg knee complete 4 views right (1 of 4)]
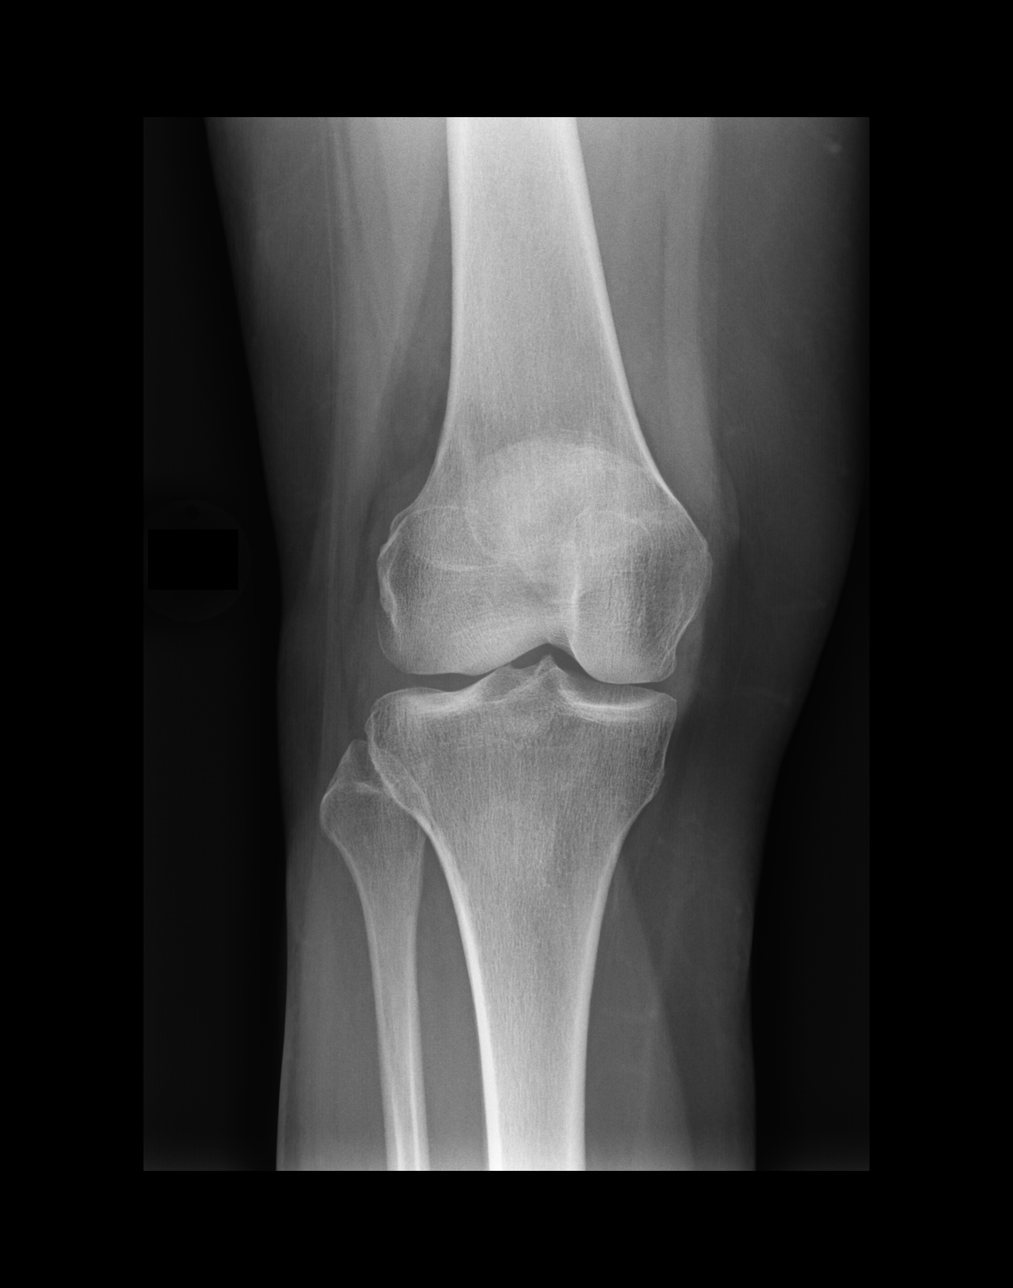

[dg knee complete 4 views right (2 of 4)]
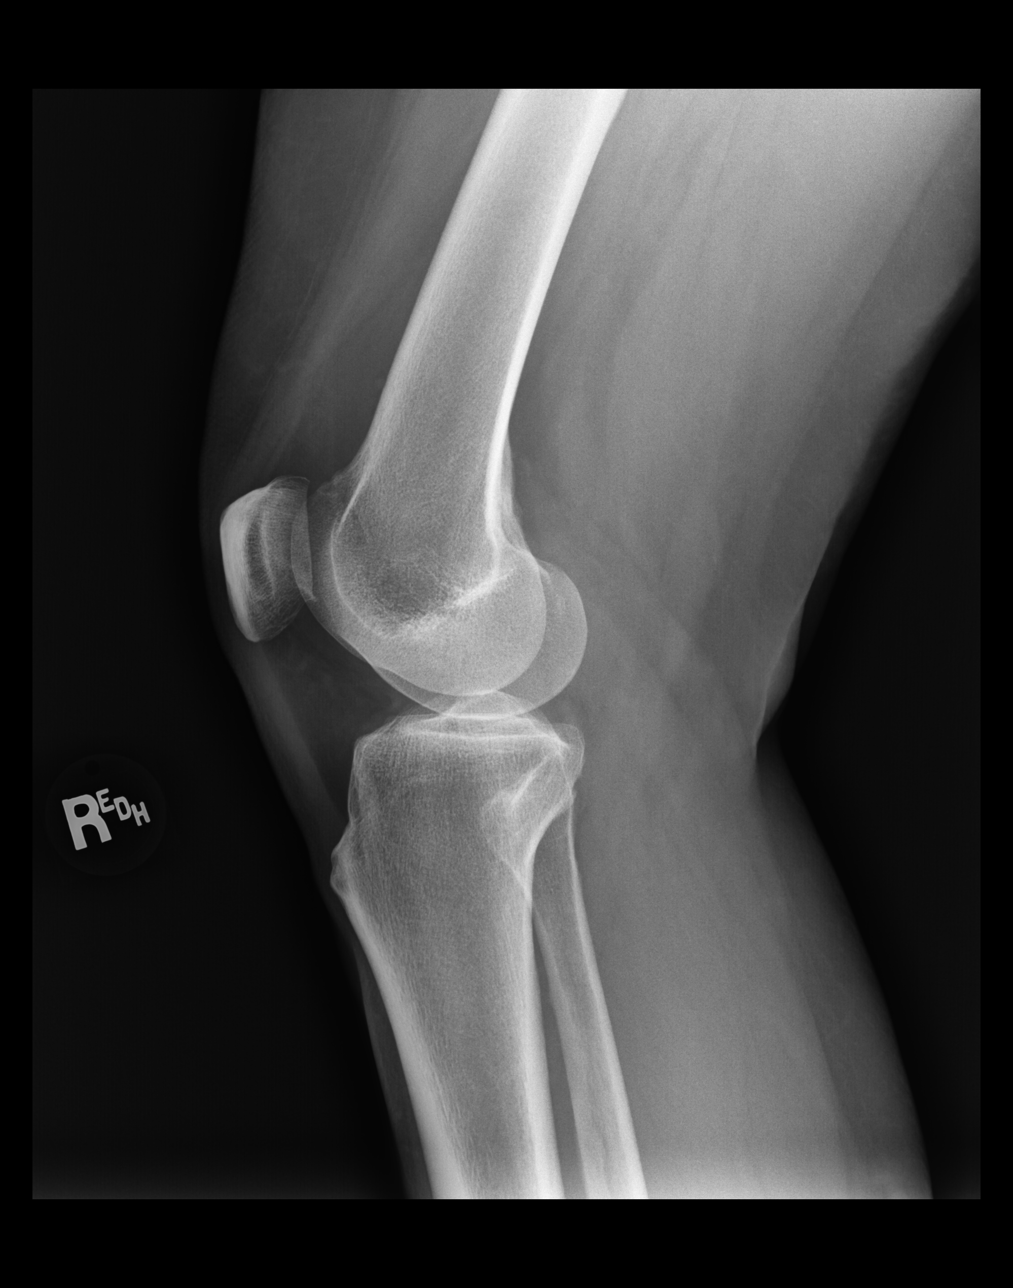

[dg knee complete 4 views right (3 of 4)]
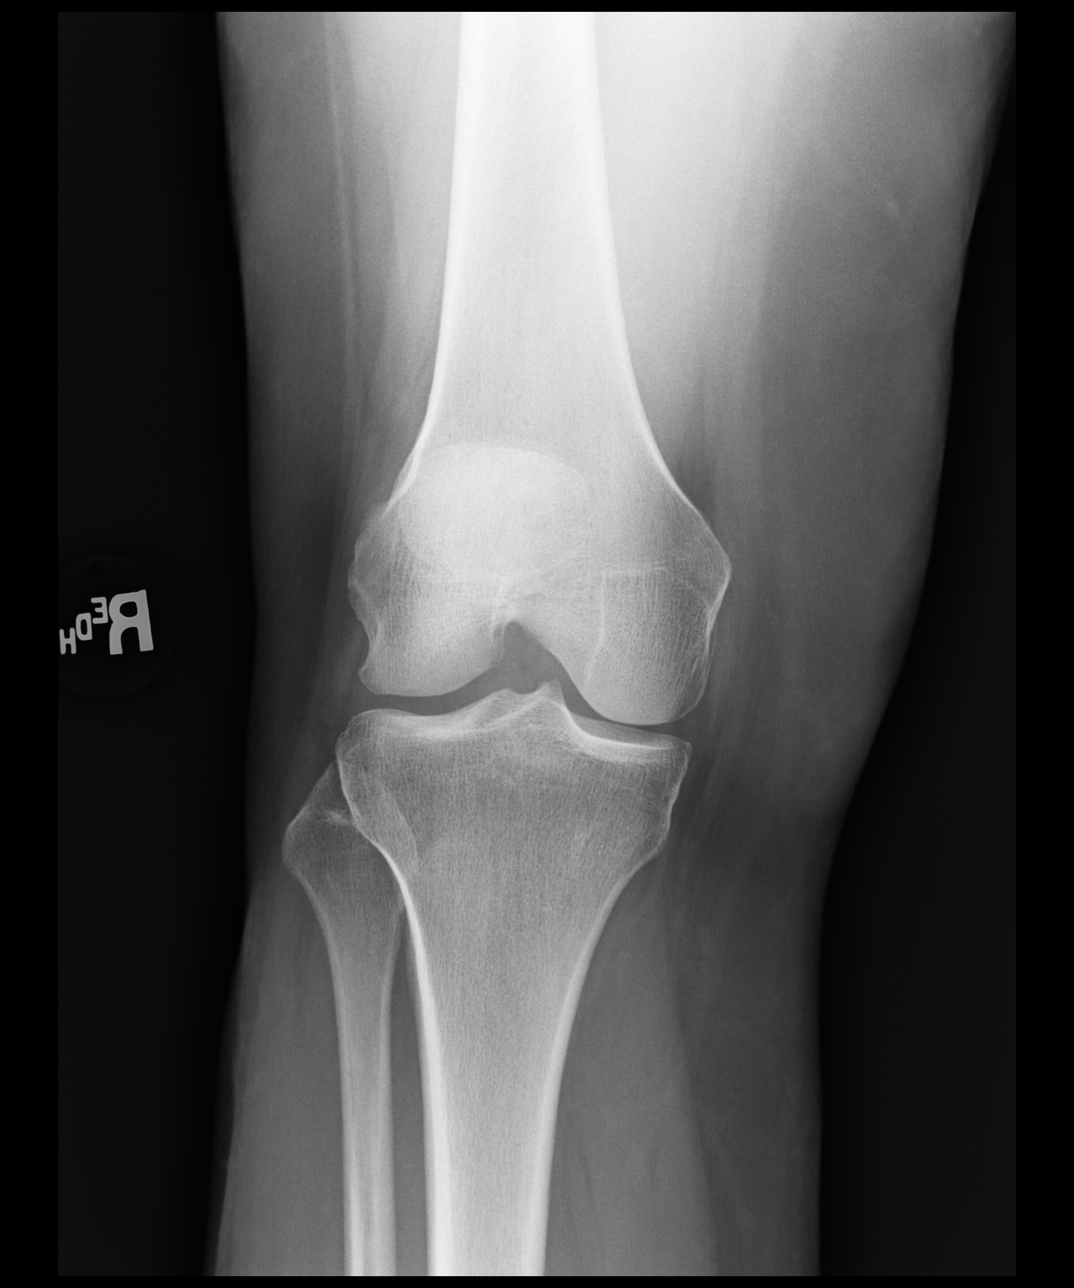

[dg knee complete 4 views right (4 of 4)]
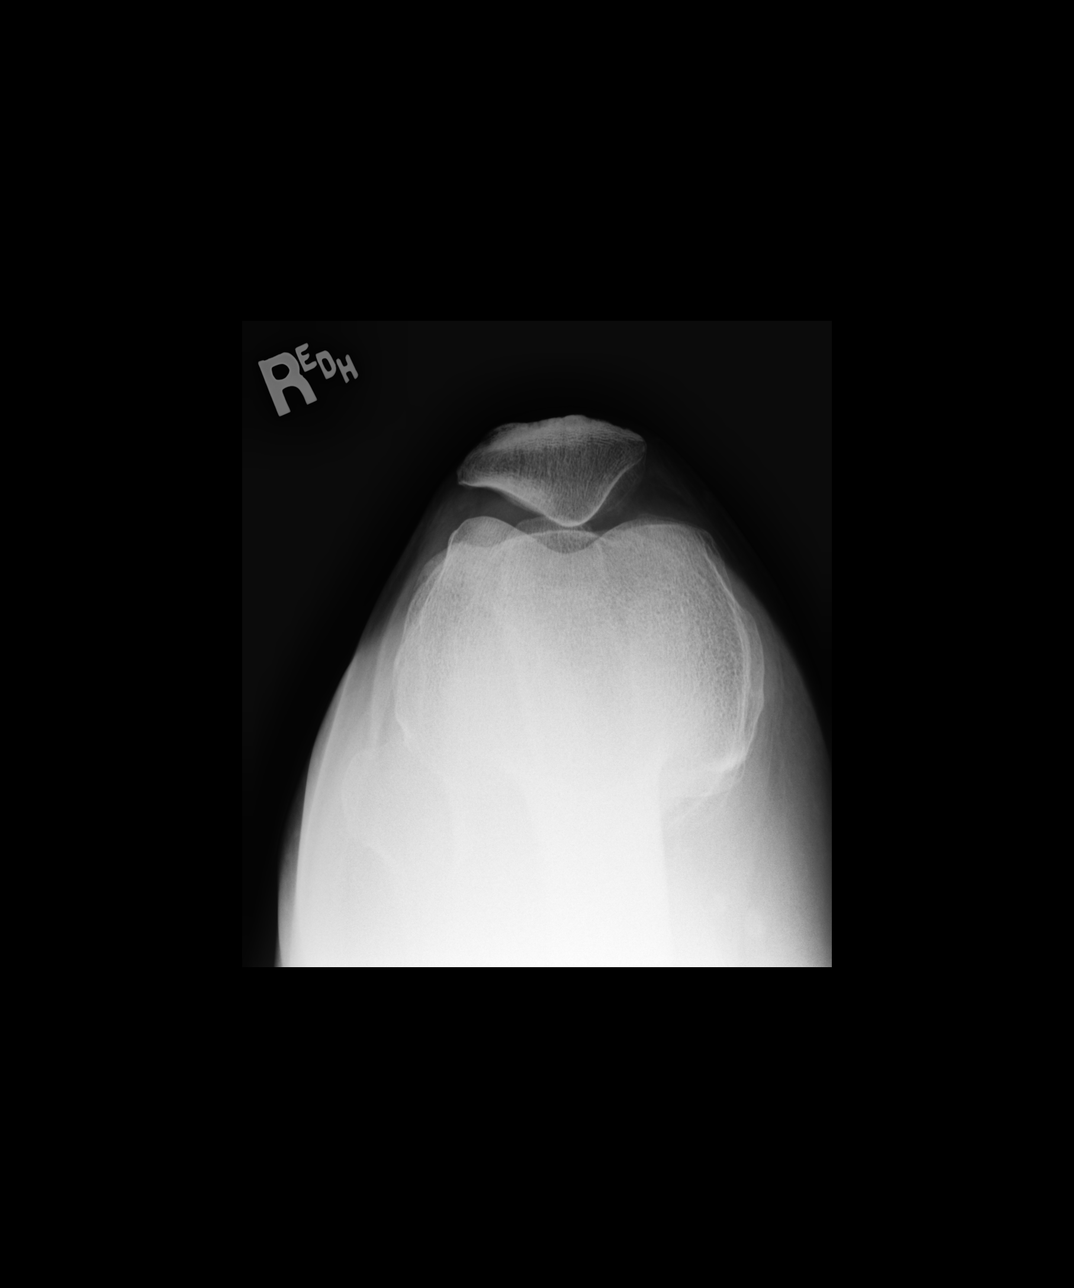

[4 of 4 positions shown; findings below may reference images not displayed]

FINDINGS: No evidence of fracture, dislocation, or joint effusion. No evidence
of arthropathy or other focal bone abnormality. Soft tissues are
unremarkable.
IMPRESSION: Negative.

## 2018-01-09 MED ORDER — PREDNISONE 20 MG PO TABS: ORAL_TABLET | ORAL | 0 refills | Status: DC

## 2018-01-09 MED ORDER — MOMETASONE FUROATE 50 MCG/ACT NA SUSP: 2.0000 | Freq: Every day | NASAL | 12 refills | Status: DC

## 2018-01-09 MED ORDER — HYDROCODONE-HOMATROPINE 5-1.5 MG/5ML PO SYRP: 5.0000 mL | ORAL_SOLUTION | Freq: Three times a day (TID) | ORAL | 0 refills | Status: DC | PRN

## 2018-01-09 MED ORDER — BENZONATATE 100 MG PO CAPS: 100.0000 mg | ORAL_CAPSULE | Freq: Three times a day (TID) | ORAL | 0 refills | Status: DC | PRN

## 2018-01-09 MED ORDER — MELOXICAM 7.5 MG PO TABS: 7.5000 mg | ORAL_TABLET | Freq: Every day | ORAL | 0 refills | Status: DC

## 2018-01-09 MED ORDER — VALACYCLOVIR HCL 500 MG PO TABS: 500.0000 mg | ORAL_TABLET | Freq: Two times a day (BID) | ORAL | 2 refills | Status: AC

## 2018-01-09 NOTE — Progress Notes (Signed)
Radiology report reviewed, images personally reviewed.  Agree with radiology report.  Pt will be called with negative imaging results.

## 2018-01-09 NOTE — Patient Instructions (Addendum)
Start using her allergy medication again, you can do either steroid nasal spray, Nasonex or Flonase, generic or prescription, they are the same, I have sent for 1 to see if your insurance will cover it.  I would start doing that daily because of your severe swelling in your nasal tissue and sinuses likely have very severe baseline allergies  Believe you do have a virus on top of this in your upper airways and sinuses, a 6-day steroid taper should help your symptoms, should also help any inflammation in your right knee.  Try cough medicines as prescribed, use nighttime syrup very sparingly and only for severe cough that prevents you from sleeping For your nasal symptoms and chest symptoms you can start doing Mucinex daily with drinking ample amount of water, can do Sudafed help decrease congestion in her nose.  If at any time you have sudden worsening of sinus pain and pressure with fever, or have any sudden worsening of shortness of breath, cough or wheeze with new fever please call us, you may need to be reevaluated, may need to end back at that time.   I prescribed Mobic, it is a nonsteroidal anti-inflammatory it is very helpful for joint pain and swelling.  Start taking Mobic after you have completed the 6 days of steroids  Use the knee brace that you have, also rest your knee, ice and elevate it, use compression when doing activity.  We will call you with results of your x-ray.   Annually would have you follow-up back in office in 2 to 4 weeks especially if not improving very much.   Upper Respiratory Infection, Adult Most upper respiratory infections (URIs) are caused by a virus. A URI affects the nose, throat, and upper air passages. The most common type of URI is often called "the common cold." Follow these instructions at home:  Take medicines only as told by your doctor.  Gargle warm saltwater or take cough drops to comfort your throat as told by your doctor.  Use a warm mist humidifier  or inhale steam from a shower to increase air moisture. This may make it easier to breathe.  Drink enough fluid to keep your pee (urine) clear or pale yellow.  Eat soups and other clear broths.  Have a healthy diet.  Rest as needed.  Go back to work when your fever is gone or your doctor says it is okay. ? You may need to stay home longer to avoid giving your URI to others. ? You can also wear a face mask and wash your hands often to prevent spread of the virus.  Use your inhaler more if you have asthma.  Do not use any tobacco products, including cigarettes, chewing tobacco, or electronic cigarettes. If you need help quitting, ask your doctor. Contact a doctor if:  You are getting worse, not better.  Your symptoms are not helped by medicine.  You have chills.  You are getting more short of breath.  You have brown or red mucus.  You have yellow or brown discharge from your nose.  You have pain in your face, especially when you bend forward.  You have a fever.  You have puffy (swollen) neck glands.  You have pain while swallowing.  You have white areas in the back of your throat. Get help right away if:  You have very bad or constant: ? Headache. ? Ear pain. ? Pain in your forehead, behind your eyes, and over your cheekbones (sinus pain). ?  Chest pain.  You have long-lasting (chronic) lung disease and any of the following: ? Wheezing. ? Long-lasting cough. ? Coughing up blood. ? A change in your usual mucus.  You have a stiff neck.  You have changes in your: ? Vision. ? Hearing. ? Thinking. ? Mood. This information is not intended to replace advice given to you by your health care provider. Make sure you discuss any questions you have with your health care provider. Document Released: 01/08/2008 Document Revised: 03/24/2016 Document Reviewed: 10/27/2013 Elsevier Interactive Patient Education  2018 [employer] Inc.  RICE for Routine Care of  Injuries Theroutine careofmanyinjuriesincludes rest, ice, compression, and elevation (RICE therapy). RICE therapy is often recommended for injuries to soft tissues, such as a muscle strain, ligament injuries, bruises, and overuse injuries. It can also be used for some bony injuries. Using RICE therapy can help to relieve pain, lessen swelling, and enable your body to heal. Rest Rest is required to allow your body to heal. This usually involves reducing your normal activities and avoiding use of the injured part of your body. Generally, you can return to your normal activities when you are comfortable and have been given permission by your health care provider. Follow these instructions at home: Ice  Icing your injury helps to keep the swelling down, and it lessens pain. Do not apply ice directly to your skin.  Put ice in a plastic bag.  Place a towel between your skin and the bag.  Leave the ice on for 20 minutes, 2-3 times a day.  Do this for as long as you are directed by your health care provider. Compression Compression means putting pressure on the injured area. Compression helps to keep swelling down, gives support, and helps with discomfort. Compression may be done with an elastic bandage. If an elastic bandage has been applied, follow these general tips:  Remove and reapply the bandage every 3-4 hours or as directed by your health care provider.  Make sure the bandage is not wrapped too tightly, because this can cut off circulation. If part of your body beyond the bandage becomes blue, numb, cold, swollen, or more painful, your bandage is most likely too tight. If this occurs, remove your bandage and reapply it more loosely.  See your health care provider if the bandage seems to be making your problems worse rather than better.  Elevation  Elevation means keeping the injured area raised. This helps to lessen swelling and decrease pain. If possible, your injured area should be  elevated at or above the level of your heart or the center of your chest. When should I seek medical care?  If your pain and swelling continue.  If your symptoms are getting worse rather than improving. These symptoms may indicate that further evaluation or further X-rays are needed. Sometimes, X-rays may not show a small broken bone (fracture) until a number of days later. Make a follow-up appointment with your health care provider. When should I seek immediate medical care?  If you have sudden severe pain at or below the area of your injury.  If you have redness or increased swelling around your injury.  If you have tingling or numbness at or below the area of your injury that does not improve after you remove the elastic bandage. This information is not intended to replace advice given to you by your health care provider. Make sure you discuss any questions you have with your health care provider. Document Released: 11/03/2000 Document Revised: 09/02/2016  Document Reviewed: 06/29/2014 Elsevier Interactive Patient Education  [employer]2018 [employer]   How to Use a Knee Brace A knee brace is a device that you wear to support your knee, especially if the knee is healing after an injury or surgery. There are several types of knee braces. Some are designed to prevent an injury (prophylactic brace). These are often worn during sports. Others support an injured knee (functional brace) or keep it still while it heals (rehabilitative brace). People with severe arthritis of the knee may benefit from a brace that takes some pressure off the knee (unloader brace). Most knee braces are made from a combination of cloth and metal or plastic. You may need to wear a knee brace to:  Relieve knee pain.  Help your knee support your weight (improve stability).  Help you walk farther (improve mobility).  Prevent injury.  Support your knee while it heals from surgery or from an injury.  What are the  risks? Generally, knee braces are very safe to wear. However, problems may occur, including:  Skin irritation that may lead to infection.  Making your condition worse if you wear the brace in the wrong way.  How to use a knee brace Different braces will have different instructions for use. Your health care provider will tell you or show you:  How to put on your brace.  How to adjust the brace.  When and how often to wear the brace.  How to remove the brace.  If you will need any assistive devices in addition to the brace, such as crutches or a cane.  In general, your brace should:  Have the hinge of the brace line up with the bend of your knee.  Have straps, hooks, or tapes that fasten snugly around your leg.  Not feel too tight or too loose.  How to care for a knee brace  Check your brace often for signs of damage, such as loose connections or attachments. Your knee brace may get damaged or wear out during normal use.  Wash the fabric parts of your brace with soap and water.  Read the insert that comes with your brace for other specific care instructions. Contact a health care provider if:  Your knee brace is too loose or too tight and you cannot adjust it.  Your knee brace causes skin redness, swelling, bruising, or irritation.  Your knee brace is not helping.  Your knee brace is making your knee pain worse. This information is not intended to replace advice given to you by your health care provider. Make sure you discuss any questions you have with your health care provider. Document Released: 10/12/2003 Document Revised: 12/28/2015 Document Reviewed: 11/14/2014 Elsevier Interactive Patient Education  [employer]2018 [employer]

## 2018-01-09 NOTE — Progress Notes (Signed)
Patient ID: Autumn Patel, female    DOB: July 17, 1961, 57 y.o.   MRN: [id number]  PCP: Donita Brooks, MD  Chief Complaint  Patient presents with  . Knee Pain    Right knee pain states aggravated when doing a lot of walking or dancing,  . Cough    Has c/o nasal passage swelling to left nostril, fever blisters    Subjective:   Autumn Patel is a 57 y.o. female, presents to clinic with CC of 2 to 3 months of right knee pain that is gradually worsened.  Had gradual onset, no injury or strain, pain occurs with activity, pain used to be intermittent now is become slightly more constant.  Pain is felt in the front of her knee, described as achy, rated 6 out of 10, associated with some mild swelling.  Exacerbated with activities such as dancing walking or going up and down stairs.  No other aggravating or alleviating factors other than resting decreases pain.  She has not tried any braces, ice or medications.  She noted having right lower leg pain in the past, with images taken of her tib-fib were negative.  She denies any other areas in her body of arthritis, pain or swelling.  It does not feel unstable.  No swelling to her calf ankle or feet.  She also has another complaint of irritating cough that became after URI symptoms with fever blisters and brief fever for 2 days that has since resolved.  She has some nasal congestion, worse to her left nostril than her right, no treatment attempted for this either.  She denies chest pain, shortness of breath, wheeze.   Patient Active Problem List   Diagnosis Date Noted  . Chronic insomnia 04/18/2016  . Benign paroxysmal positional vertigo 02/02/2015  . Abdominal pain 01/03/2015  . Hypertension   . Allergy      Prior to Admission medications   Medication Sig Start Date End Date Taking? Authorizing Provider  hydrochlorothiazide (HYDRODIURIL) 12.5 MG tablet Take 12.5 mg by mouth daily.   Yes [provider]  Multiple Vitamins-Minerals  (MULTIVITAMIN PO) Take by mouth. [facility] with iron and 500mg  calcium plus zinc & folic acid   Yes [provider]  zolpidem (AMBIEN) 10 MG tablet Take 1 tablet (10 mg total) by mouth at bedtime as needed. 04/17/16  Yes [place], Velna Hatchet, MD     No Known Allergies   Family History  Problem Relation Age of Onset  . Hypertension Mother   . Heart disease Father   . Hypertension Father   . Diabetes Father   . Stroke Father   . Cancer Sister        breast  . Vision loss Paternal Uncle      Social History   Socioeconomic History  . Marital status: Married    Spouse name: Not on file  . Number of children: Not on file  . Years of education: Not on file  . Highest education level: Not on file  Occupational History  . Not on file  Social Needs  . Financial resource strain: Not on file  . Food insecurity:    Worry: Not on file    Inability: Not on file  . Transportation needs:    Medical: Not on file    Non-medical: Not on file  Tobacco Use  . Smoking status: Never Smoker  . Smokeless tobacco: Never Used  Substance and Sexual Activity  . Alcohol use:  No  . Drug use: No  . Sexual activity: Yes    Comment: married  Lifestyle  . Physical activity:    Days per week: Not on file    Minutes per session: Not on file  . Stress: Not on file  Relationships  . Social connections:    Talks on phone: Not on file    Gets together: Not on file    Attends religious service: Not on file    Active member of club or organization: Not on file    Attends meetings of clubs or organizations: Not on file    Relationship status: Not on file  . Intimate partner violence:    Fear of current or ex partner: Not on file    Emotionally abused: Not on file    Physically abused: Not on file    Forced sexual activity: Not on file  Other Topics Concern  . Not on file  Social History Narrative  . Not on file     Review of Systems  Constitutional: Negative.  Negative for  activity change, appetite change, chills, diaphoresis, fatigue, fever and unexpected weight change.  HENT: Positive for congestion, mouth sores, rhinorrhea, sinus pressure and sinus pain. Negative for ear discharge, ear pain, facial swelling, nosebleeds, sneezing, sore throat, tinnitus, trouble swallowing and voice change.   Eyes: Negative.   Respiratory: Positive for cough. Negative for apnea, choking, chest tightness, shortness of breath, wheezing and stridor.   Cardiovascular: Negative.  Negative for chest pain, palpitations and leg swelling.  Gastrointestinal: Negative.  Negative for abdominal pain, constipation, diarrhea, nausea and vomiting.  Endocrine: Negative.   Genitourinary: Negative.   Musculoskeletal: Positive for arthralgias and joint swelling. Negative for back pain, gait problem, myalgias, neck pain and neck stiffness.  Skin: Negative.  Negative for color change, pallor, rash and wound.  Allergic/Immunologic: Negative.   Neurological: Negative.  Negative for dizziness, weakness and numbness.  Hematological: Negative.   Psychiatric/Behavioral: Negative.   All other systems reviewed and are negative.      Objective:    Vitals:   01/09/18 0902  BP: 126/78  Pulse: 90  Resp: 15  Temp: 98.2 F (36.8 C)  TempSrc: Oral  SpO2: 97%  Weight: 176 lb 4 oz (79.9 kg)  Height: 5\' 7"  (1.702 m)      Physical Exam  Constitutional: She is oriented to person, place, and time. She appears well-developed and well-nourished.  Non-toxic appearance. No distress.  HENT:  Head: Normocephalic and atraumatic.  Right Ear: External ear normal.  Left Ear: External ear normal.  Mouth/Throat: Uvula is midline, oropharynx is clear and moist and mucous membranes are normal. No oropharyngeal exudate.  Nasal turbinates enlarged bilaterally, worse on the left, scant clear discharge, other nasal mucosa erythematous, no sinus tenderness to palpation, posterior oropharynx normal in appearance without  erythema edema or exudate, no cervical lymphadenopathy  Eyes: Pupils are equal, round, and reactive to light. Conjunctivae, EOM and lids are normal. No scleral icterus.  Neck: Normal range of motion and phonation normal. Neck supple. No tracheal deviation present.  Cardiovascular: Normal rate, regular rhythm, normal heart sounds, intact distal pulses and normal pulses. Exam reveals no gallop and no friction rub.  No murmur heard. Pulses:      Radial pulses are 2+ on the right side, and 2+ on the left side.       Posterior tibial pulses are 2+ on the right side, and 2+ on the left side.  Pulmonary/Chest: Effort normal and  breath sounds normal. No stridor. No respiratory distress. She has no wheezes. She has no rhonchi. She has no rales. She exhibits no tenderness.  Abdominal: Soft. Normal appearance and bowel sounds are normal. She exhibits no distension and no mass. There is no tenderness. There is no rebound and no guarding.  Musculoskeletal: Normal range of motion. She exhibits no edema or deformity.       Right hip: Normal.       Right knee: She exhibits swelling. She exhibits normal range of motion, no effusion, no ecchymosis, no deformity, no laceration, no erythema, normal alignment, no LCL laxity, normal patellar mobility, no bony tenderness, normal meniscus and no MCL laxity. No tenderness found.       Right ankle: Normal.  No swelling or tenderness to calf, no pretibial edema to right lower extremity Right ankle and right hip normal Normal sensation and pulses to bilateral lower extremities   Lymphadenopathy:    She has no cervical adenopathy.  Neurological: She is alert and oriented to person, place, and time. She exhibits normal muscle tone. Coordination and gait normal.  Skin: Skin is warm, dry and intact. Capillary refill takes less than 2 seconds. No rash noted. She is not diaphoretic. No pallor.  Psychiatric: She has a normal mood and affect. Her speech is normal and behavior is  normal.  Nursing note and vitals reviewed.         Assessment & Plan:      ICD-10-CM   1. Acute pain of right knee M25.561 meloxicam (MOBIC) 7.5 MG tablet    predniSONE (DELTASONE) 20 MG tablet    DG Knee Complete 4 Views Right    CANCELED: DG Knee 4 Views W/Patella Right (sunrise view, tunnel view)  2. Upper respiratory tract infection, unspecified type J06.9 predniSONE (DELTASONE) 20 MG tablet    benzonatate (TESSALON) 100 MG capsule  3. Seasonal allergic rhinitis, unspecified trigger J30.2 predniSONE (DELTASONE) 20 MG tablet    mometasone (NASONEX) 50 MCG/ACT nasal spray    HYDROcodone-homatropine (HYCODAN) 5-1.5 MG/5ML syrup  4. Cold sore B00.1 valACYclovir (VALTREX) 500 MG tablet    Start allergy medication, steroid nasal spray, antihistamine, 6 days steroid taper to help with sinusitis and knee pain, after steroid is done she was instructed to start Mobic. Right knee pain, no instability, no bony tenderness, no evident effusion.  Obtain x-ray, conservative treatment with rest, ice compression and elevation.  Follow-up if not improving in 2 to 4 weeks.  Suspect it may be osteoarthritis, or possibly meniscal injury, although there is no specific instance of injury or strain.   Danelle Berry, PA-C 01/09/18 9:27 AM

## 2018-03-13 ENCOUNTER — Ambulatory Visit: Payer: BC Managed Care – PPO | Admitting: Family Medicine

## 2018-03-13 ENCOUNTER — Encounter: Payer: Self-pay | Admitting: Family Medicine

## 2018-03-13 VITALS — BP 136/90 | HR 74 | Temp 98.1°F | Resp 16 | Ht 67.0 in | Wt 182.0 lb

## 2018-03-13 DIAGNOSIS — R635 Abnormal weight gain: Secondary | ICD-10-CM

## 2018-03-13 NOTE — Progress Notes (Signed)
Subjective:    Patient ID: Autumn Patel, female    DOB: Jul 06, 1961, 57 y.o.   MRN: [id number]  HPI Patient has experienced 12 pounds of weight gain over the last 2 to 3 months without explanation.  She has not changed her diet.  She has not changed her exercise pattern.  She is not eating differently.  She is not on any new medication.  However she is not eating any specific diet to help lose weight.  She is also not exercising.  She was concerned because the change seem to occur without reason.  She has been experiencing menopausal symptoms for several years now however there have been no recent changes in the frequency duration or intensity of hot flashes.  She denies any heat or cold intolerance.  She denies any hair loss.  She denies any constipation.  She denies any depression type symptoms. Past Medical History:  Diagnosis Date  . Allergy   . Hypertension    Past Surgical History:  Procedure Laterality Date  . CESAREAN SECTION    . CHOLECYSTECTOMY     Current Outpatient Medications on File Prior to Visit  Medication Sig Dispense Refill  . hydrochlorothiazide (HYDRODIURIL) 12.5 MG tablet Take 12.5 mg by mouth daily.    Marland Kitchen zolpidem (AMBIEN) 10 MG tablet Take 1 tablet (10 mg total) by mouth at bedtime as needed. 15 tablet 1   No current facility-administered medications on file prior to visit.    No Known Allergies Social History   Socioeconomic History  . Marital status: Married    Spouse name: Not on file  . Number of children: Not on file  . Years of education: Not on file  . Highest education level: Not on file  Occupational History  . Not on file  Social Needs  . Financial resource strain: Not on file  . Food insecurity:    Worry: Not on file    Inability: Not on file  . Transportation needs:    Medical: Not on file    Non-medical: Not on file  Tobacco Use  . Smoking status: Never Smoker  . Smokeless tobacco: Never Used  Substance and Sexual Activity  . Alcohol  use: No  . Drug use: No  . Sexual activity: Yes    Comment: married  Lifestyle  . Physical activity:    Days per week: Not on file    Minutes per session: Not on file  . Stress: Not on file  Relationships  . Social connections:    Talks on phone: Not on file    Gets together: Not on file    Attends religious service: Not on file    Active member of club or organization: Not on file    Attends meetings of clubs or organizations: Not on file    Relationship status: Not on file  . Intimate partner violence:    Fear of current or ex partner: Not on file    Emotionally abused: Not on file    Physically abused: Not on file    Forced sexual activity: Not on file  Other Topics Concern  . Not on file  Social History Narrative  . Not on file      Review of Systems  All other systems reviewed and are negative.      Objective:   Physical Exam  Neck: No thyromegaly present.  Cardiovascular: Normal rate, regular rhythm and normal heart sounds.  Pulmonary/Chest: Effort normal and breath sounds normal. No  stridor. No respiratory distress. She has no wheezes.  Abdominal: Soft. Bowel sounds are normal.  Vitals reviewed.         Assessment & Plan:  Weight gain - Plan: CBC with Differential/Platelet, COMPLETE METABOLIC PANEL WITH GFR, Lipid panel, TSH, T4, free  Begin with lab work to assess for metabolic problems that can cause sudden weight gain including a CBC, CMP, fasting lipid panel, TSH, and free T4.  If lab work is normal, we discussed options and the patient would like to try phentermine 37.5 mg p.o. every morning for 3 months.  We discussed the possible cardiovascular risk of this and the side effects and she elects to proceed with this therapy.  We also discussed restricting calories to less than 1500 a day and try to get 30 minutes of aerobic exercise 5 days a week.

## 2018-03-14 LAB — CBC WITH DIFFERENTIAL/PLATELET
BASOS ABS: 29 {cells}/uL (ref 0–200)
Basophils Relative: 0.6 %
Eosinophils Absolute: 192 cells/uL (ref 15–500)
Eosinophils Relative: 4 %
HEMATOCRIT: 37.5 % (ref 35.0–45.0)
Hemoglobin: 12.5 g/dL (ref 11.7–15.5)
LYMPHS ABS: 1574 {cells}/uL (ref 850–3900)
MCH: 27.8 pg (ref 27.0–33.0)
MCHC: 33.3 g/dL (ref 32.0–36.0)
MCV: 83.5 fL (ref 80.0–100.0)
MPV: 9.9 fL (ref 7.5–12.5)
Monocytes Relative: 8.7 %
NEUTROS PCT: 53.9 %
Neutro Abs: 2587 cells/uL (ref 1500–7800)
PLATELETS: 320 10*3/uL (ref 140–400)
RBC: 4.49 10*6/uL (ref 3.80–5.10)
RDW: 14.2 % (ref 11.0–15.0)
TOTAL LYMPHOCYTE: 32.8 %
WBC mixed population: 418 cells/uL (ref 200–950)
WBC: 4.8 10*3/uL (ref 3.8–10.8)

## 2018-03-14 LAB — COMPLETE METABOLIC PANEL WITH GFR
AG RATIO: 1.6 (calc) (ref 1.0–2.5)
ALKALINE PHOSPHATASE (APISO): 93 U/L (ref 33–130)
ALT: 17 U/L (ref 6–29)
AST: 16 U/L (ref 10–35)
Albumin: 4.3 g/dL (ref 3.6–5.1)
BILIRUBIN TOTAL: 0.5 mg/dL (ref 0.2–1.2)
BUN: 16 mg/dL (ref 7–25)
CALCIUM: 9.5 mg/dL (ref 8.6–10.4)
CHLORIDE: 107 mmol/L (ref 98–110)
CO2: 27 mmol/L (ref 20–32)
Creat: 0.78 mg/dL (ref 0.50–1.05)
GFR, Est African American: 98 mL/min/{1.73_m2} (ref >=60)
GFR, Est Non African American: 85 mL/min/{1.73_m2} (ref >=60)
GLOBULIN: 2.7 g/dL (ref 1.9–3.7)
Glucose, Bld: 83 mg/dL (ref 65–99)
POTASSIUM: 4.4 mmol/L (ref 3.5–5.3)
SODIUM: 141 mmol/L (ref 135–146)
Total Protein: 7 g/dL (ref 6.1–8.1)

## 2018-03-14 LAB — TSH: TSH: 2.37 mIU/L (ref 0.40–4.50)

## 2018-03-14 LAB — LIPID PANEL
CHOLESTEROL: 235 mg/dL — AB (ref <200)
HDL: 74 mg/dL (ref >50)
LDL Cholesterol (Calc): 143 mg/dL (calc) — ABNORMAL HIGH
NON-HDL CHOLESTEROL (CALC): 161 mg/dL — AB (ref <130)
TRIGLYCERIDES: 80 mg/dL (ref <150)
Total CHOL/HDL Ratio: 3.2 (calc) (ref <5.0)

## 2018-03-14 LAB — T4, FREE: Free T4: 1 ng/dL (ref 0.8–1.8)

## 2018-03-16 ENCOUNTER — Encounter: Payer: Self-pay | Admitting: Family Medicine

## 2018-03-18 ENCOUNTER — Telehealth: Payer: Self-pay | Admitting: Family Medicine

## 2018-03-18 NOTE — Telephone Encounter (Signed)
Pt called and stated that you told her if her labs were good you would give her 3 months of phentermine?

## 2018-03-19 ENCOUNTER — Other Ambulatory Visit: Payer: Self-pay | Admitting: Family Medicine

## 2018-03-19 MED ORDER — PHENTERMINE HCL 37.5 MG PO TABS: 37.5000 mg | ORAL_TABLET | Freq: Every day | ORAL | 2 refills | Status: DC

## 2018-03-19 NOTE — Telephone Encounter (Signed)
Pt aware via vm 

## 2018-03-19 NOTE — Telephone Encounter (Signed)
[facility].  I forgot.

## 2018-04-27 ENCOUNTER — Encounter: Payer: Self-pay | Admitting: *Deleted

## 2018-04-27 DIAGNOSIS — N951 Menopausal and female climacteric states: Secondary | ICD-10-CM | POA: Insufficient documentation

## 2018-04-27 DIAGNOSIS — B009 Herpesviral infection, unspecified: Secondary | ICD-10-CM | POA: Insufficient documentation

## 2018-04-27 DIAGNOSIS — R634 Abnormal weight loss: Secondary | ICD-10-CM | POA: Insufficient documentation

## 2018-06-16 LAB — HM MAMMOGRAPHY

## 2018-08-06 ENCOUNTER — Encounter: Payer: Self-pay | Admitting: *Deleted

## 2019-04-19 ENCOUNTER — Telehealth: Payer: Self-pay | Admitting: Family Medicine

## 2019-04-19 ENCOUNTER — Other Ambulatory Visit: Payer: Self-pay

## 2019-04-19 DIAGNOSIS — Z20822 Contact with and (suspected) exposure to covid-19: Secondary | ICD-10-CM

## 2019-04-19 NOTE — Telephone Encounter (Signed)
noted 

## 2019-04-19 NOTE — Telephone Encounter (Signed)
Call placed to patient.   Reports that she is still asymptomatic, but has gone to be tested this AM.   Advised to continue self quarantine until results are obtained.

## 2019-04-19 NOTE — Telephone Encounter (Signed)
Call and check on patient see if she needs to go have COVID-19 testing done.  She call this past Saturday her daughter-in-law has positive because it lives with them.  She did not have any symptoms.  Advised is asymptomatic nothing needs to be done she can just quarantine she states her job stated that she could come to work if she did not have symptoms she was in a check with her HR.  If she does now have symptoms or if her job requires testing she may go through the [employer] testing site.

## 2019-04-20 LAB — NOVEL CORONAVIRUS, NAA: SARS-CoV-2, NAA: NOT DETECTED

## 2019-07-28 ENCOUNTER — Other Ambulatory Visit: Payer: Self-pay

## 2019-07-28 ENCOUNTER — Ambulatory Visit (INDEPENDENT_AMBULATORY_CARE_PROVIDER_SITE_OTHER): Payer: BC Managed Care – PPO | Admitting: Family Medicine

## 2019-07-28 DIAGNOSIS — J069 Acute upper respiratory infection, unspecified: Secondary | ICD-10-CM | POA: Diagnosis not present

## 2019-07-28 MED ORDER — HYDROCODONE-HOMATROPINE 5-1.5 MG/5ML PO SYRP: 5.0000 mL | ORAL_SOLUTION | Freq: Three times a day (TID) | ORAL | 0 refills | Status: DC | PRN

## 2019-07-28 NOTE — Progress Notes (Signed)
Subjective:    Patient ID: Autumn Patel, female    DOB: [birth year], 58 y.o.   MRN: [id number]  HPI Patient is being seen today as a telephone visit.  Phone call began at 753.  Phone call concluded at 8:00.  Symptoms began approximately 2 days ago.  Symptoms include cold-like symptoms.  She reports rhinorrhea, head congestion, and a cough that is nonproductive.  She denies any fever.  She denies any body aches.  She went yesterday and had a rapid test for Covid that returned negative.  Today the rhinorrhea is improving.  However she is requesting something that she can take for cough primarily at night to help her get some rest.  She denies any chest pain or shortness of breath.  She denies any change in her sense of taste or smell.  She denies any nausea or vomiting.  She denies any pleurisy. Past Medical History:  Diagnosis Date  . Allergy   . Hypertension    Past Surgical History:  Procedure Laterality Date  . CESAREAN SECTION    . CHOLECYSTECTOMY     Current Outpatient Medications on File Prior to Visit  Medication Sig Dispense Refill  . hydrochlorothiazide (HYDRODIURIL) 12.5 MG tablet Take 12.5 mg by mouth daily.    . phentermine (ADIPEX-P) 37.5 MG tablet Take 1 tablet (37.5 mg total) by mouth daily before breakfast. 30 tablet 2  . zolpidem (AMBIEN) 10 MG tablet Take 1 tablet (10 mg total) by mouth at bedtime as needed. 15 tablet 1   No current facility-administered medications on file prior to visit.   No Known Allergies Social History   Socioeconomic History  . Marital status: Married    Spouse name: Not on file  . Number of children: Not on file  . Years of education: Not on file  . Highest education level: Not on file  Occupational History  . Not on file  Tobacco Use  . Smoking status: Never Smoker  . Smokeless tobacco: Never Used  Substance and Sexual Activity  . Alcohol use: No  . Drug use: No  . Sexual activity: Yes    Comment: married  Other Topics Concern   . Not on file  Social History Narrative  . Not on file   Social Determinants of Health   Financial Resource Strain:   . Difficulty of Paying Living Expenses: Not on file  Food Insecurity:   . Worried About Charity fundraiser in the Last Year: Not on file  . Ran Out of Food in the Last Year: Not on file  Transportation Needs:   . Lack of Transportation (Medical): Not on file  . Lack of Transportation (Non-Medical): Not on file  Physical Activity:   . Days of Exercise per Week: Not on file  . Minutes of Exercise per Session: Not on file  Stress:   . Feeling of Stress : Not on file  Social Connections:   . Frequency of Communication with Friends and Family: Not on file  . Frequency of Social Gatherings with Friends and Family: Not on file  . Attends Religious Services: Not on file  . Active Member of Clubs or Organizations: Not on file  . Attends Archivist Meetings: Not on file  . Marital Status: Not on file  Intimate Partner Violence:   . Fear of Current or Ex-Partner: Not on file  . Emotionally Abused: Not on file  . Physically Abused: Not on file  . Sexually Abused: Not on  file      Review of Systems  All other systems reviewed and are negative.      Objective:   Physical Exam  Patient is speaking full and complete sentences without any respiratory distress.      Assessment & Plan:  Viral URI with cough  Thankfully, the patient's symptoms are already starting to improve and her Covid test was negative.  Therefore I feel that the patient most likely has a viral upper respiratory infection/cold.  I recommended symptomatic treatment.  She can use Hycodan 1 teaspoon every 6-8 hours as needed for cough.  Anticipate symptoms should gradually improve over the next 5 to 7 days.

## 2019-10-13 ENCOUNTER — Other Ambulatory Visit: Payer: Self-pay | Admitting: Obstetrics & Gynecology

## 2019-10-13 DIAGNOSIS — R928 Other abnormal and inconclusive findings on diagnostic imaging of breast: Secondary | ICD-10-CM

## 2019-10-21 ENCOUNTER — Ambulatory Visit
Admission: RE | Admit: 2019-10-21 | Discharge: 2019-10-21 | Disposition: A | Discharge status: Home / Self Care | Payer: BC Managed Care – PPO | Source: Ambulatory Visit | Attending: Obstetrics & Gynecology | Admitting: Obstetrics & Gynecology

## 2019-10-21 ENCOUNTER — Other Ambulatory Visit: Payer: Self-pay

## 2019-10-21 ENCOUNTER — Other Ambulatory Visit: Payer: Self-pay | Admitting: Obstetrics & Gynecology

## 2019-10-21 DIAGNOSIS — R928 Other abnormal and inconclusive findings on diagnostic imaging of breast: Secondary | ICD-10-CM

## 2019-10-21 DIAGNOSIS — N6489 Other specified disorders of breast: Secondary | ICD-10-CM

## 2019-10-21 IMAGING — MG MM DIGITAL DIAGNOSTIC UNILAT*L* W/ TOMO W/ CAD
4 series · 4 of 12 positions shown · non-contrast
Comparison: Previous exam(s).

CLINICAL DATA: The patient was called back due to left breast
distortion.

EXAM:
DIGITAL DIAGNOSTIC LEFT MAMMOGRAM WITH TOMO
ULTRASOUND LEFT BREAST

[L CC synth-2D]
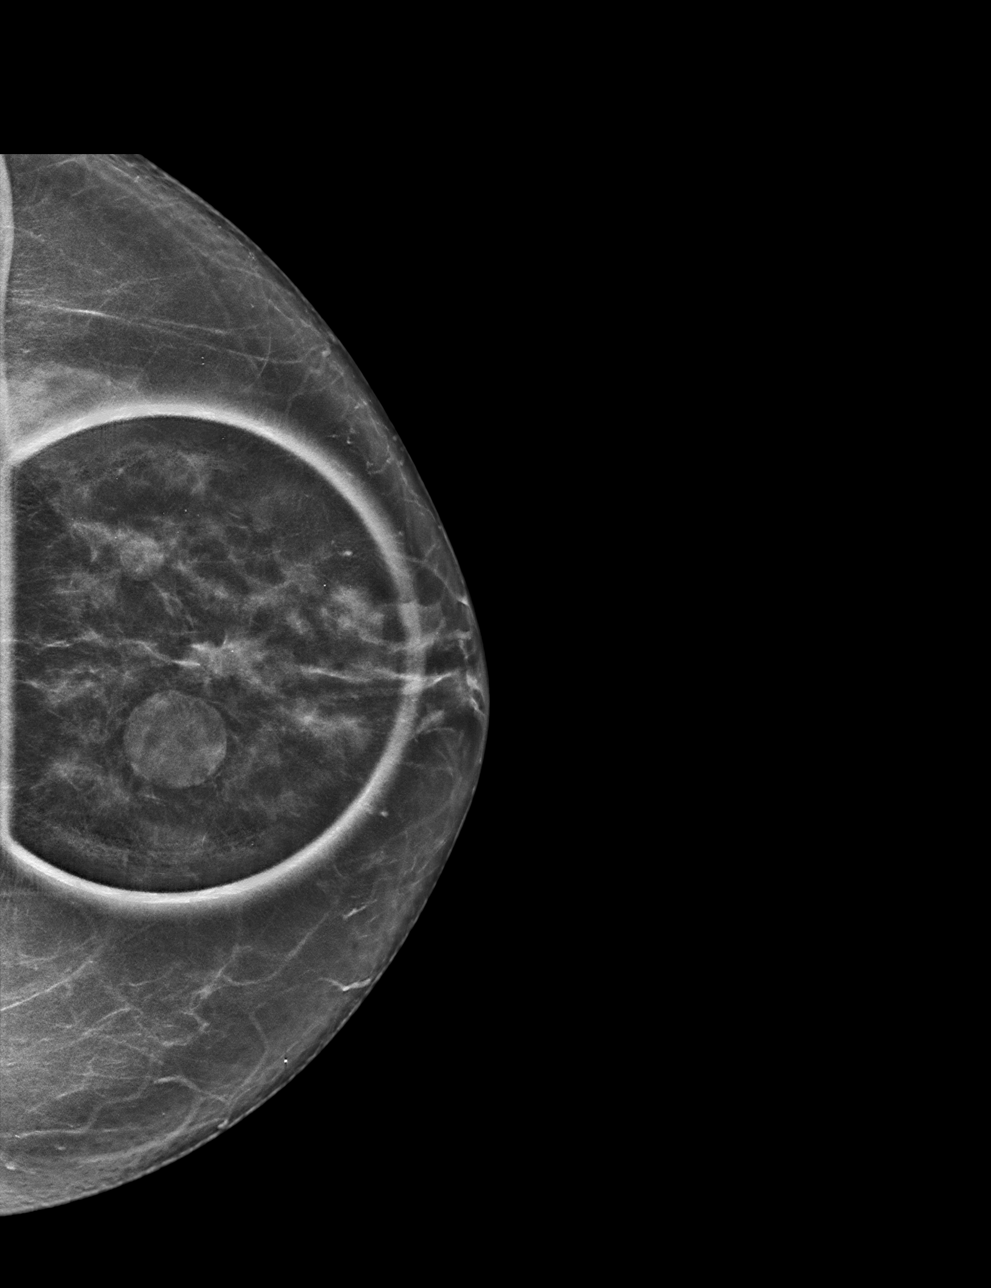

[L MLO synth-2D]
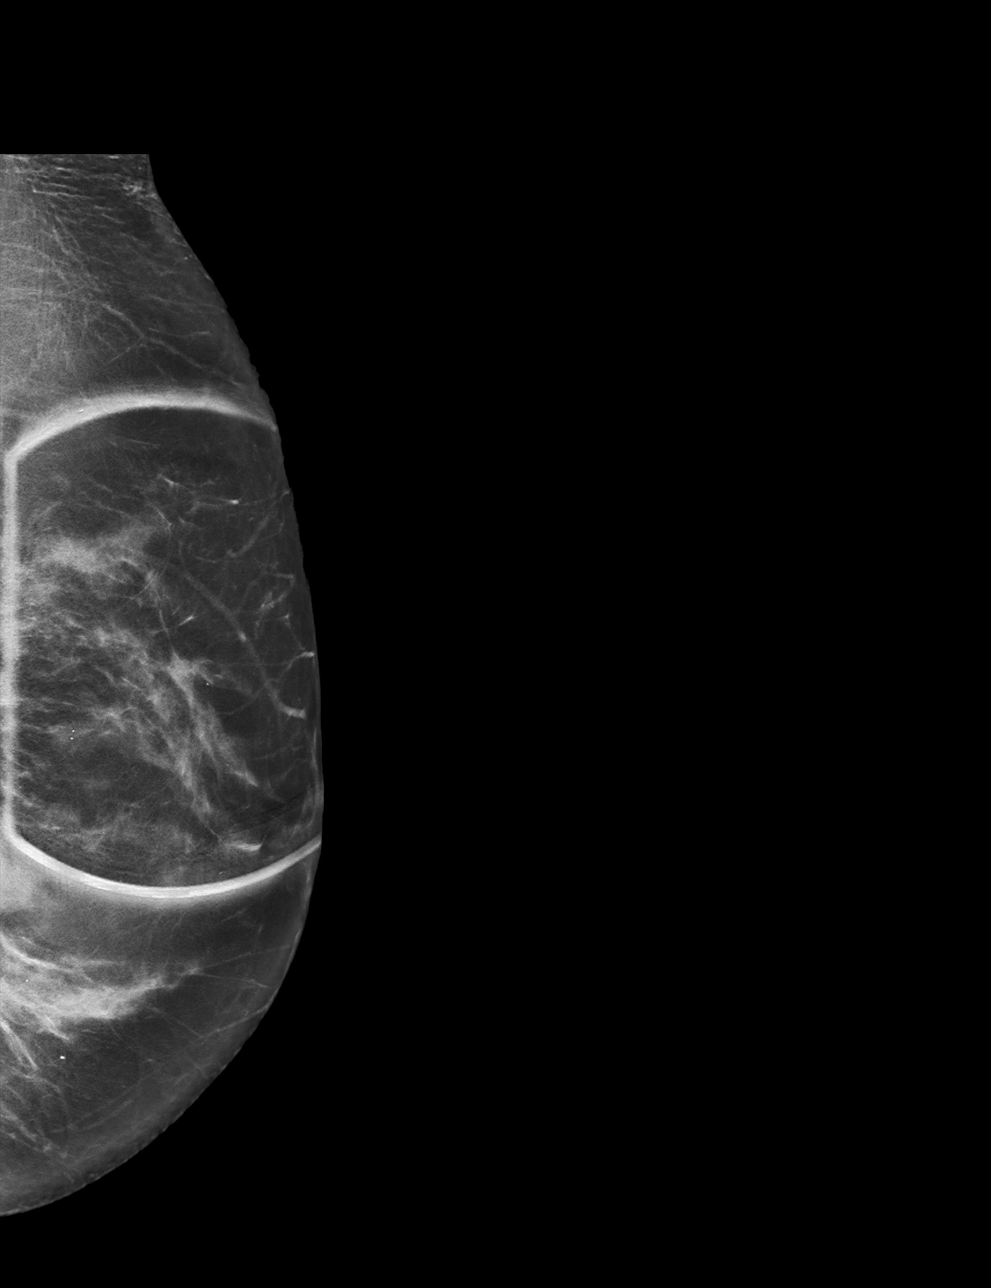

[L MLO tomo · tomo slice 33/65.0]
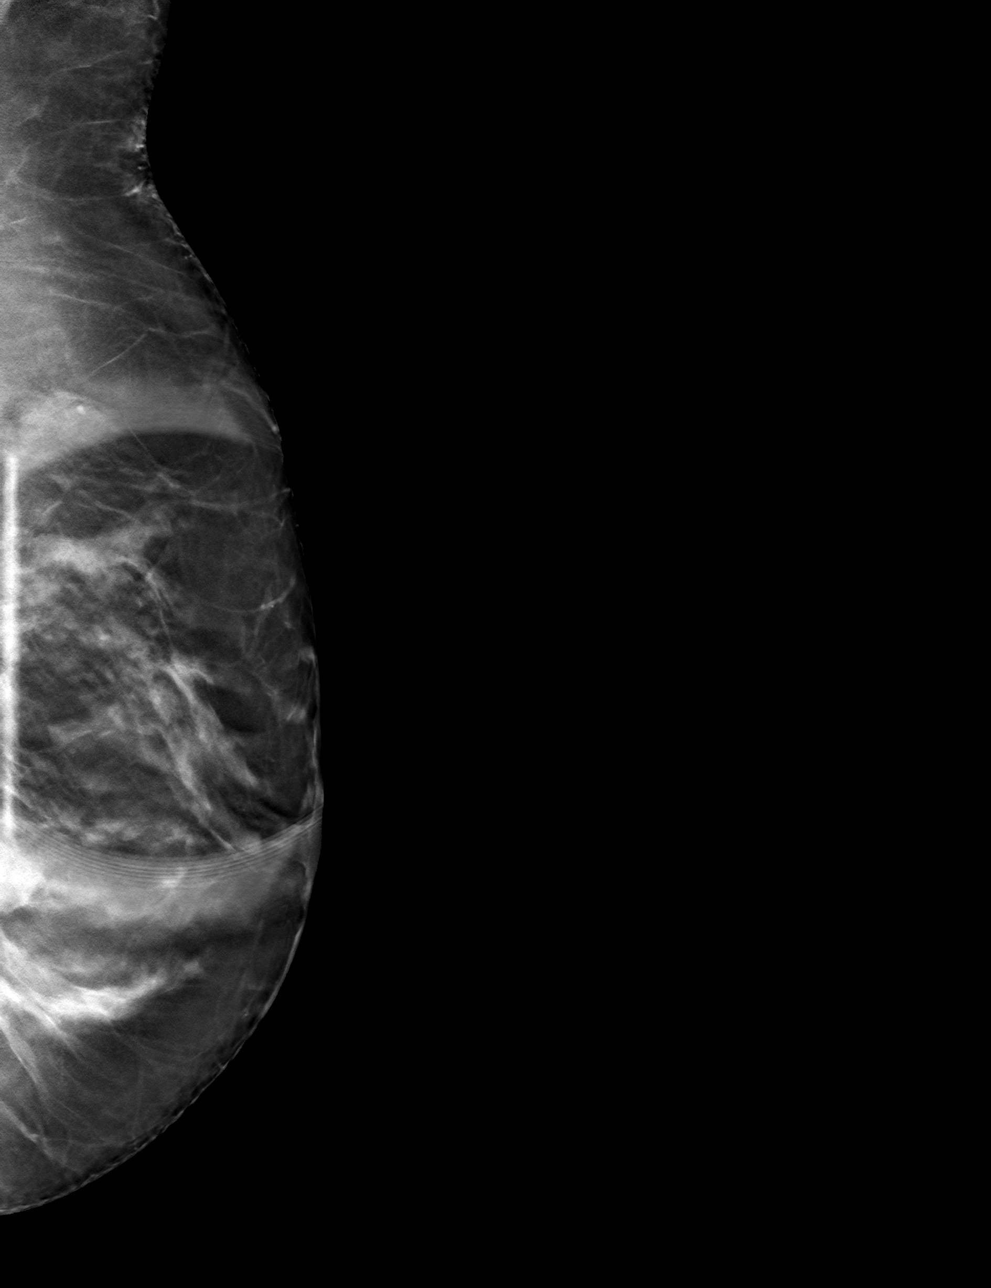

[L CC tomo · tomo slice 31/61.0]
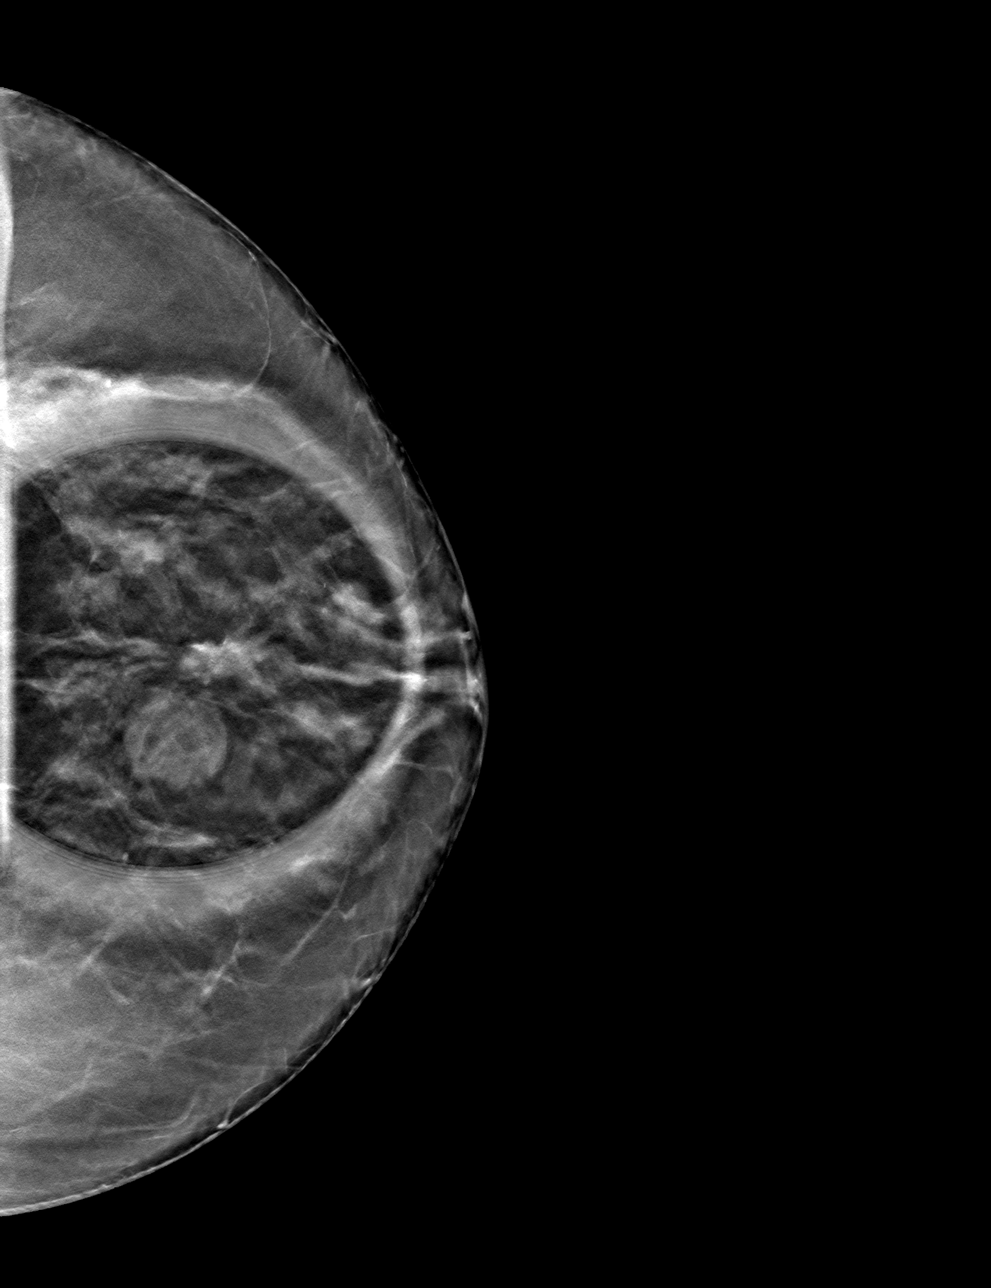

[4 of 12 positions shown; findings below may reference images not displayed]

ACR Breast Density Category c: The breast tissue is heterogeneously
dense, which may obscure small masses.
FINDINGS: The left breast distortion persists on additional imaging located in
the superior left breast, best seen on the cc view.

On physical exam, no suspicious lumps are identified.

Targeted ultrasound is performed, showing no correlate for the left
breast distortion. No left axillary adenopathy.
IMPRESSION: Left breast distortion with no sonographic correlate.

RECOMMENDATION:
Stereotactic biopsy of the left breast distortion.

The patient had 2 sisters diagnosed with breast cancer in their 40s.
She is unsure whether she has been tested genetically in the past.
If the patient has not been genetically tested since [DATE],
recommend consultation with a genetic counselor. If the patient is
found to be genetically negative, recommend assessing her lifetime
risk of breast cancer with a statistical model such as the
Tyrer-Cuzick. If she is genetically negative but has a lifetime risk
of greater than 20%, recommend annual mammography and breast MRI.

I have discussed the findings and recommendations with the patient.
If applicable, a reminder letter will be sent to the patient
regarding the next appointment.

BI-RADS CATEGORY  4: Suspicious.

## 2019-10-21 IMAGING — US US BREAST*L* LIMITED INC AXILLA
1 series · 3 of 3 positions shown · non-contrast
Comparison: Previous exam(s).

CLINICAL DATA: The patient was called back due to left breast
distortion.

EXAM:
DIGITAL DIAGNOSTIC LEFT MAMMOGRAM WITH TOMO
ULTRASOUND LEFT BREAST

[Series 1: us breast*left* limited inc axilla · 0.07mm/px · 3 of 3 slices shown]
[im 1/3]
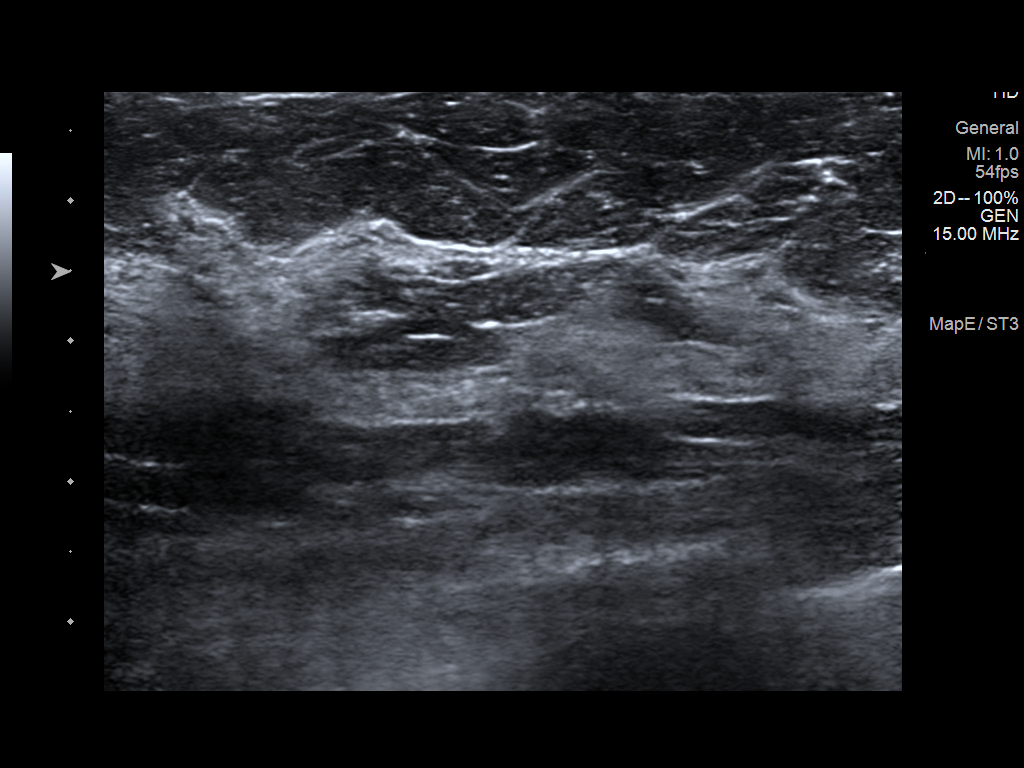
[im 2/3]
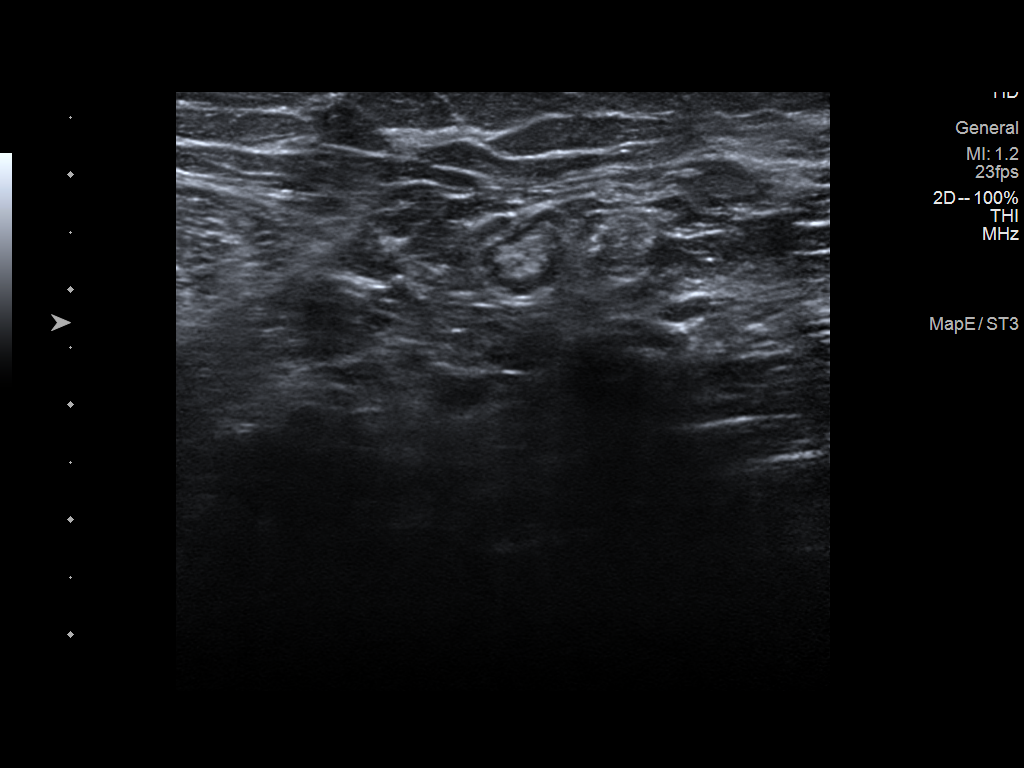
[im 3/3]
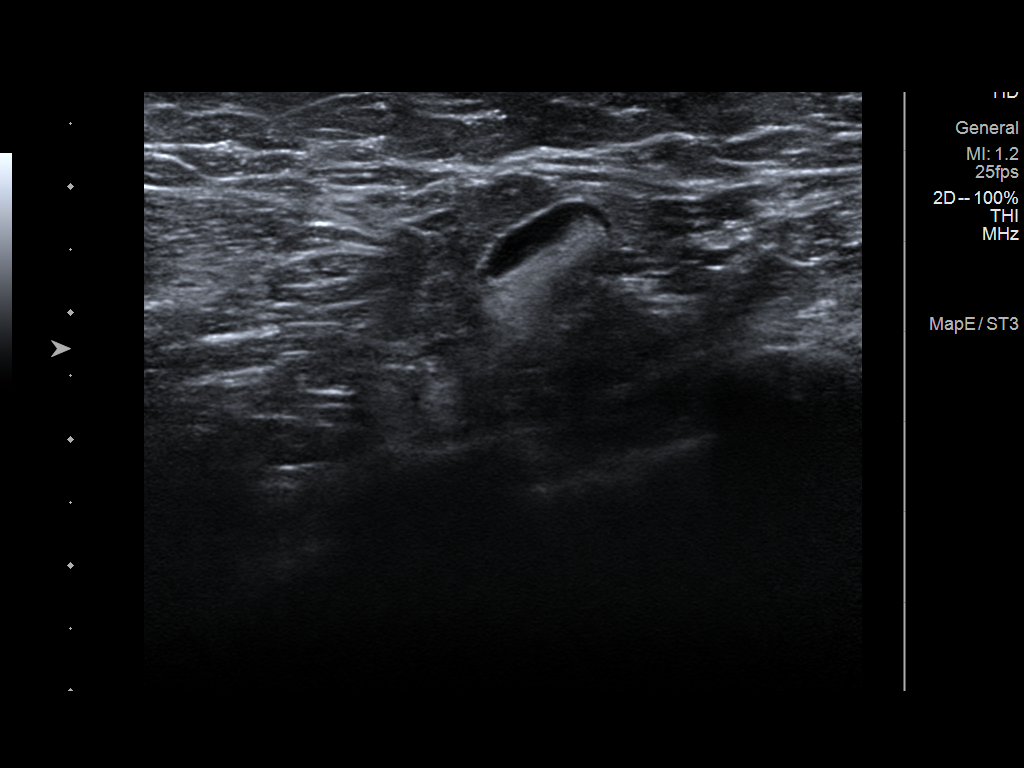

[3 of 3 positions shown; findings below may reference images not displayed]

ACR Breast Density Category c: The breast tissue is heterogeneously
dense, which may obscure small masses.
FINDINGS: The left breast distortion persists on additional imaging located in
the superior left breast, best seen on the cc view.

On physical exam, no suspicious lumps are identified.

Targeted ultrasound is performed, showing no correlate for the left
breast distortion. No left axillary adenopathy.
IMPRESSION: Left breast distortion with no sonographic correlate.

RECOMMENDATION:
Stereotactic biopsy of the left breast distortion.

The patient had 2 sisters diagnosed with breast cancer in their 40s.
She is unsure whether she has been tested genetically in the past.
If the patient has not been genetically tested since [DATE],
recommend consultation with a genetic counselor. If the patient is
found to be genetically negative, recommend assessing her lifetime
risk of breast cancer with a statistical model such as the
Tyrer-Cuzick. If she is genetically negative but has a lifetime risk
of greater than 20%, recommend annual mammography and breast MRI.

I have discussed the findings and recommendations with the patient.
If applicable, a reminder letter will be sent to the patient
regarding the next appointment.

BI-RADS CATEGORY  4: Suspicious.

## 2019-11-10 ENCOUNTER — Other Ambulatory Visit: Payer: Self-pay

## 2019-11-10 ENCOUNTER — Ambulatory Visit
Admission: RE | Admit: 2019-11-10 | Discharge: 2019-11-10 | Disposition: A | Discharge status: Home / Self Care | Payer: BC Managed Care – PPO | Source: Ambulatory Visit | Attending: Obstetrics & Gynecology | Admitting: Obstetrics & Gynecology

## 2019-11-10 DIAGNOSIS — N6489 Other specified disorders of breast: Secondary | ICD-10-CM

## 2019-11-10 IMAGING — MG MM BREAST LOCALIZATION CLIP
4 series · 4 of 12 positions shown · non-contrast
Comparison: Previous exam(s).

CLINICAL DATA: Post stereotactic guided biopsy of distortion in the
upper-outer left breast.

EXAM:
DIAGNOSTIC LEFT MAMMOGRAM POST STEREOTACTIC BIOPSY

[L ML synth-2D]
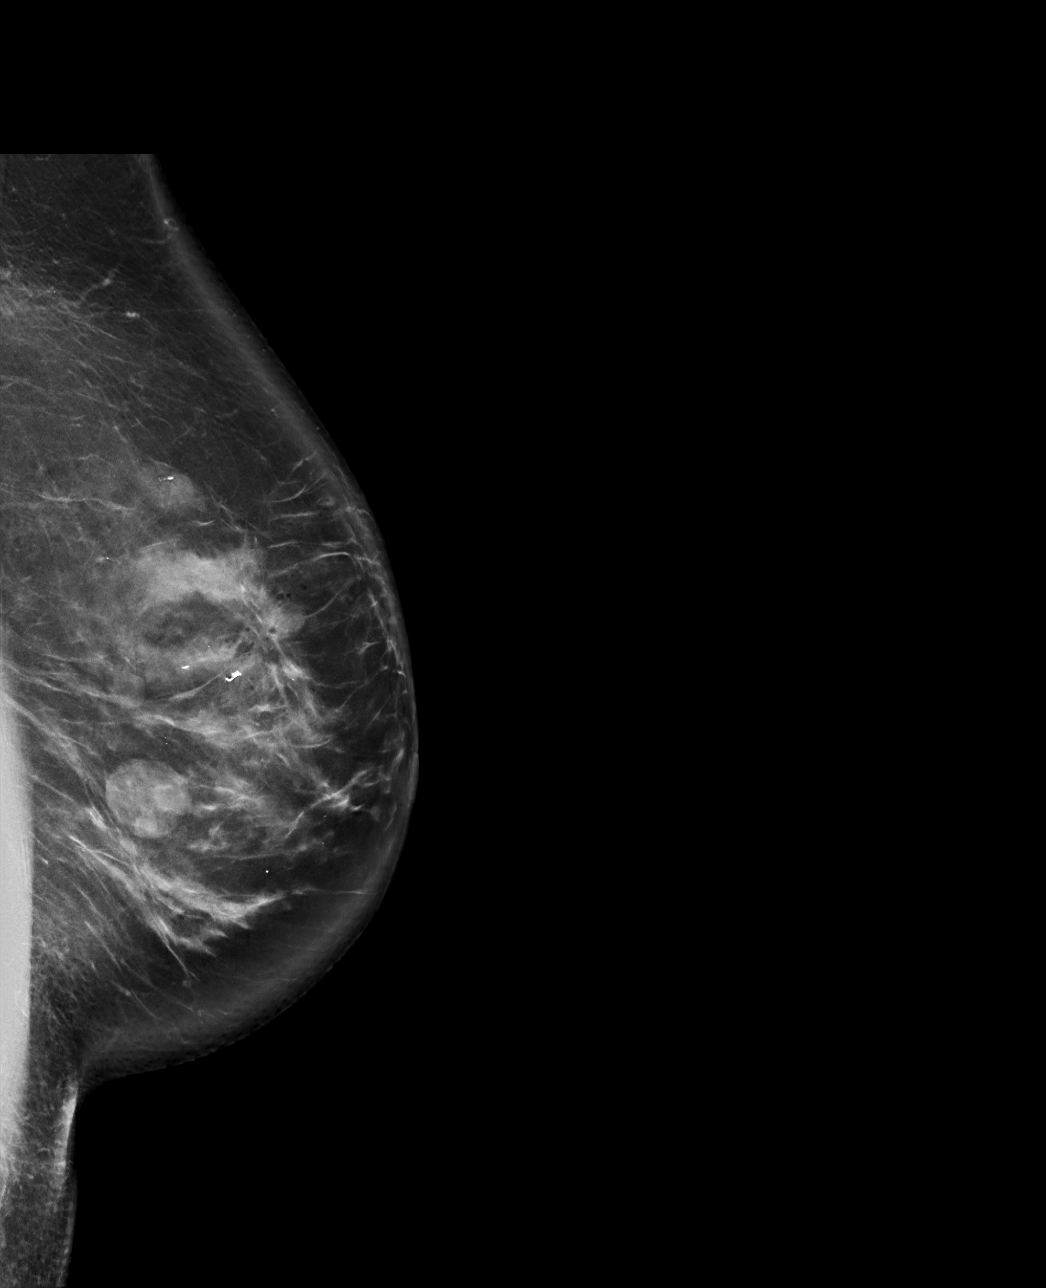

[L CC synth-2D]
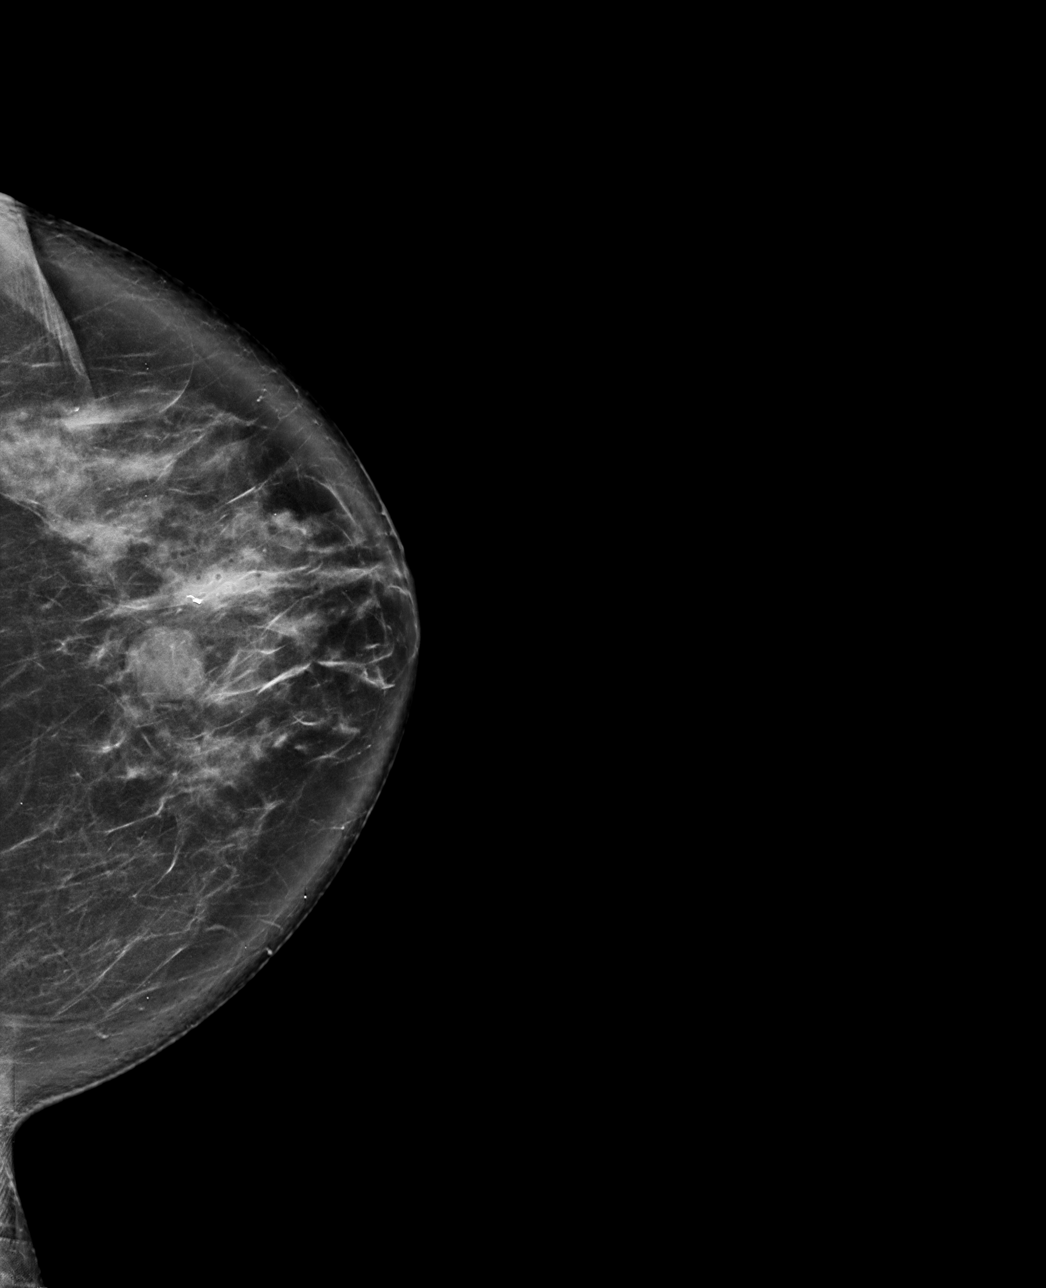

[L ML tomo · tomo slice 50/99.0]
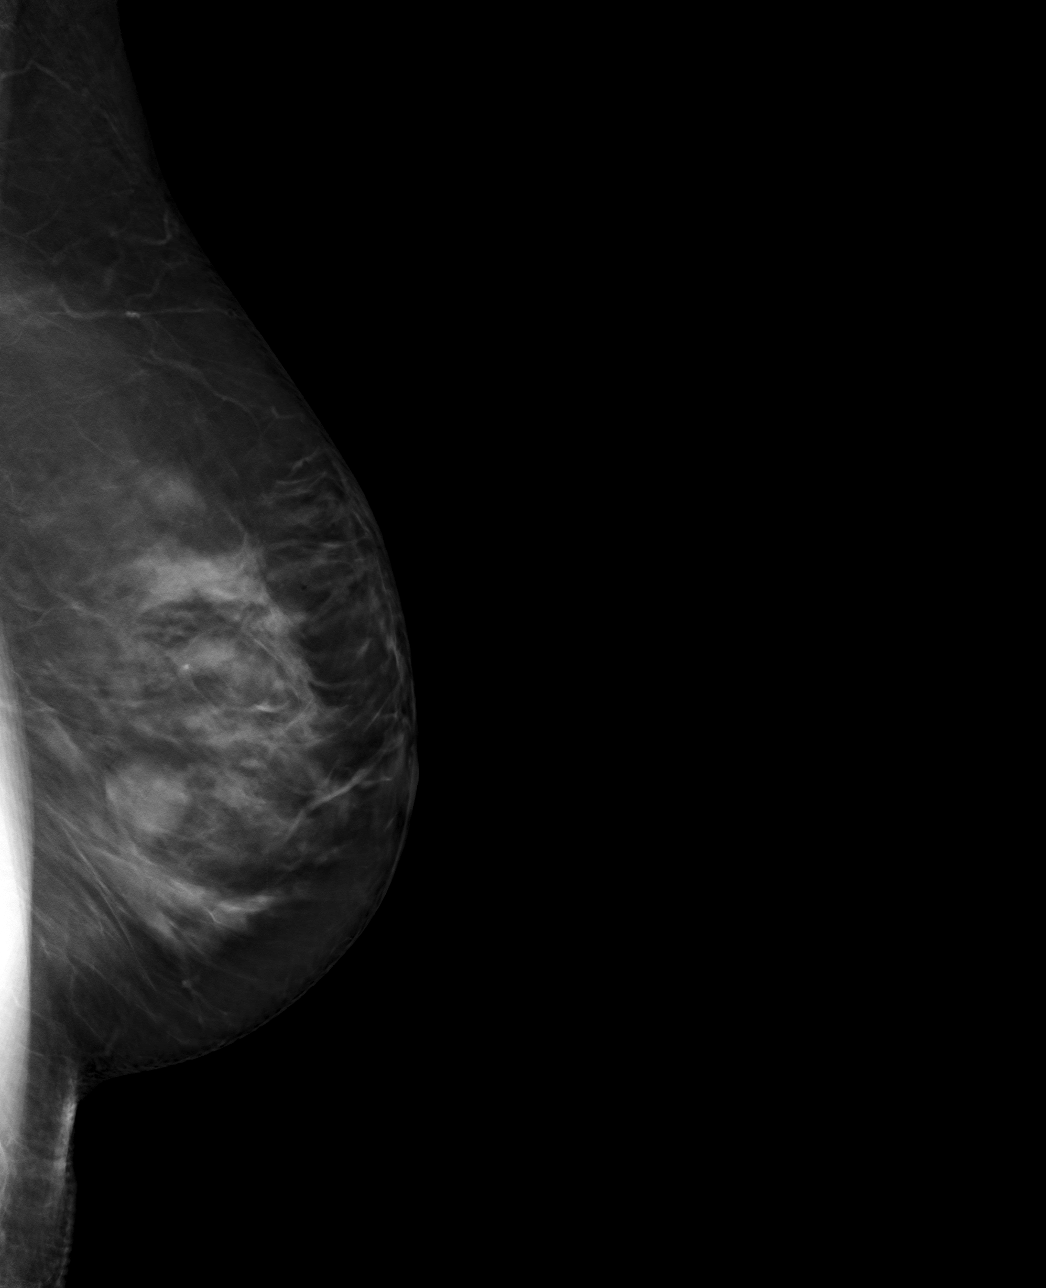

[L CC tomo · tomo slice 45/89.0]
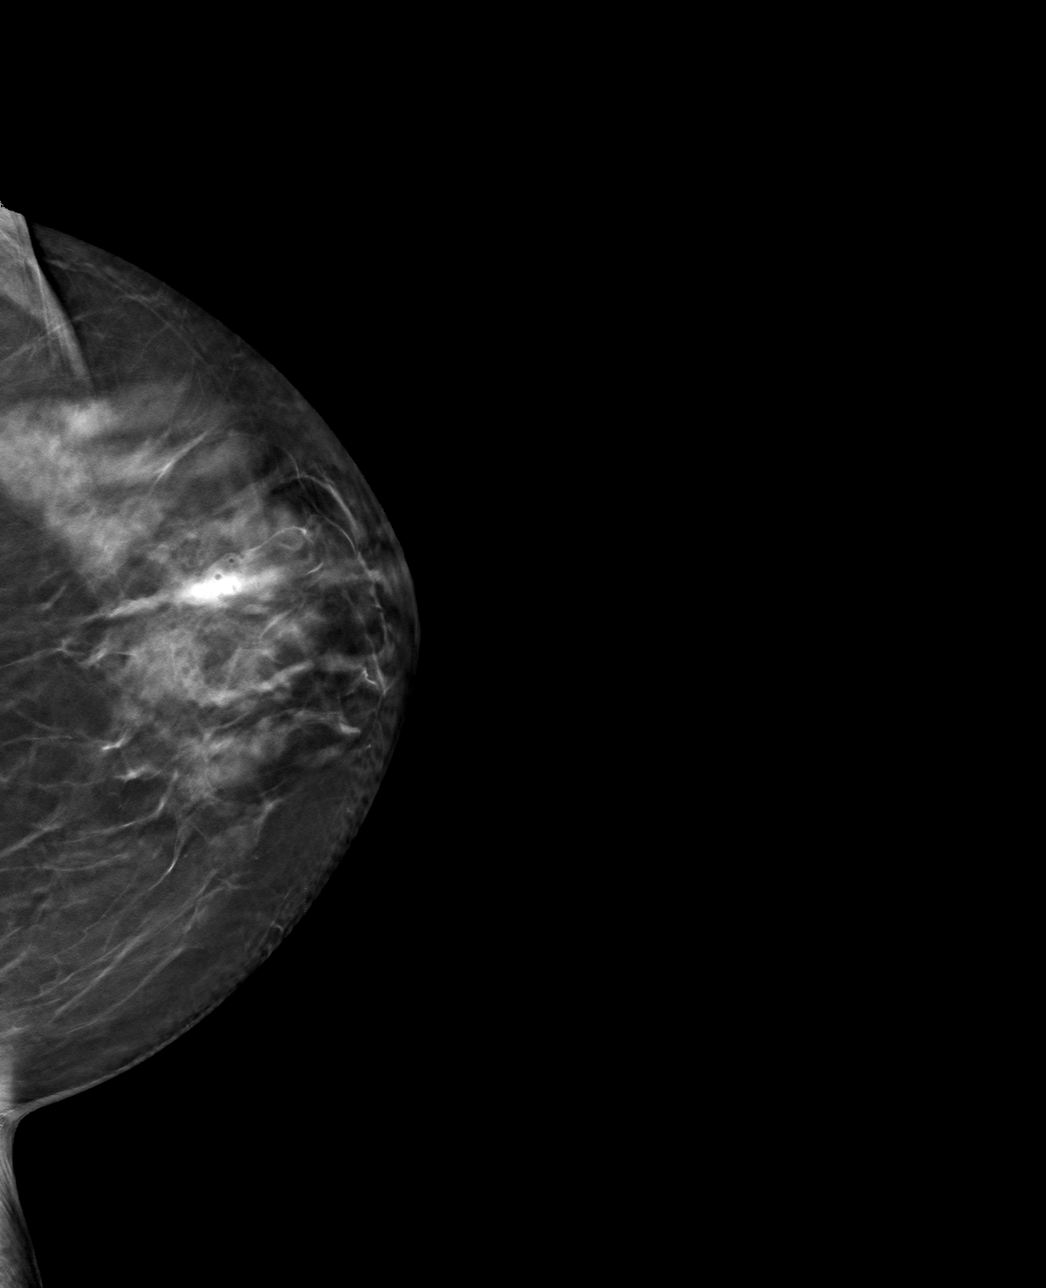

[4 of 12 positions shown; findings below may reference images not displayed]

FINDINGS: Mammographic images were obtained following stereotactic guided
biopsy of distortion in the upper-outer left breast. Biopsy marking
clip is positioned at the site of the biopsied distortion in the
upper-outer left breast, although the distortion is not clearly
visualized on the ML tomograms.
IMPRESSION: Biopsy marking clip is positioned at the site of biopsied distortion
in the upper-outer left breast, although the distortion is not
clearly visualized on the ML tomograms.

Final Assessment: Post Procedure Mammograms for Marker Placement

## 2019-11-10 IMAGING — MG MM BREAST BX W LOC DEV 1ST LESION IMAGE BX SPEC STEREO GUIDE*L*
8 of 9 series · 8 of 17 positions shown · non-contrast
Comparison: Previous exams.
COMPARISON: Previous exams.

Addendum:
CLINICAL DATA: 58-year-old female with a suspicious area of
distortion in the upper slightly outer left breast.

EXAM:
LEFT BREAST STEREOTACTIC CORE NEEDLE BIOPSY

[L (1 of 6)]
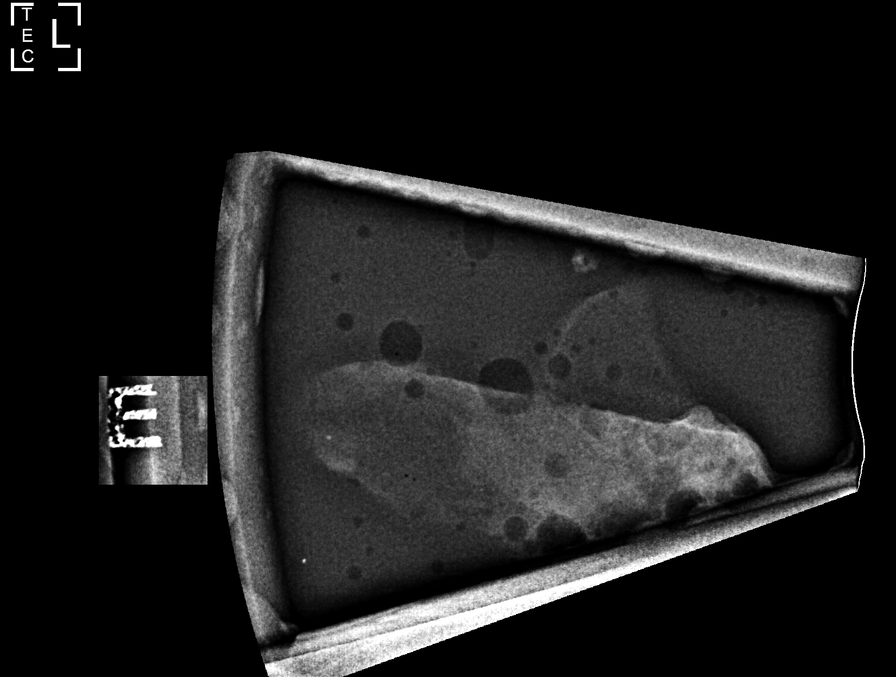

[L (2 of 6)]
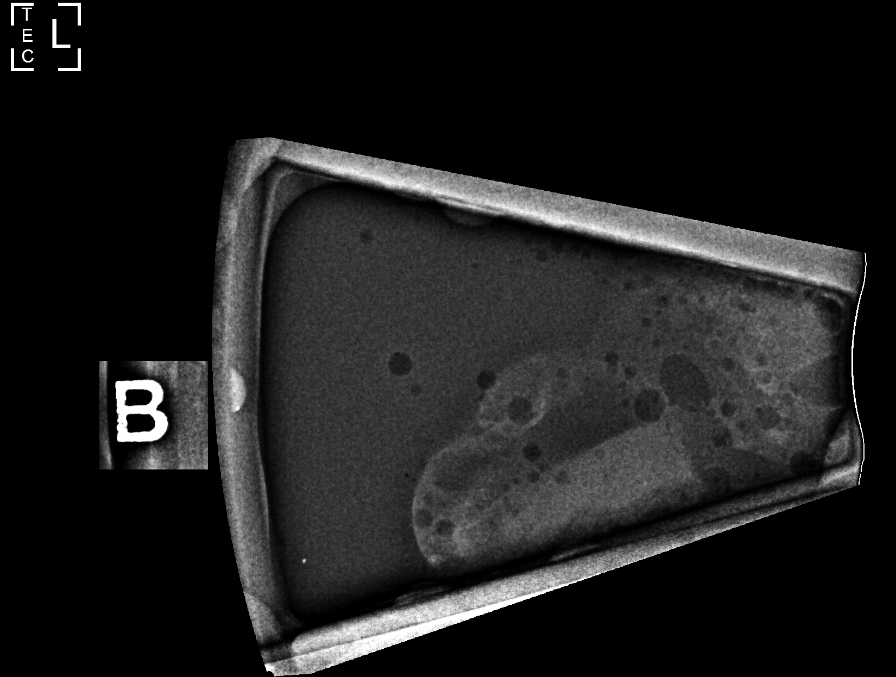

[L (3 of 6)]
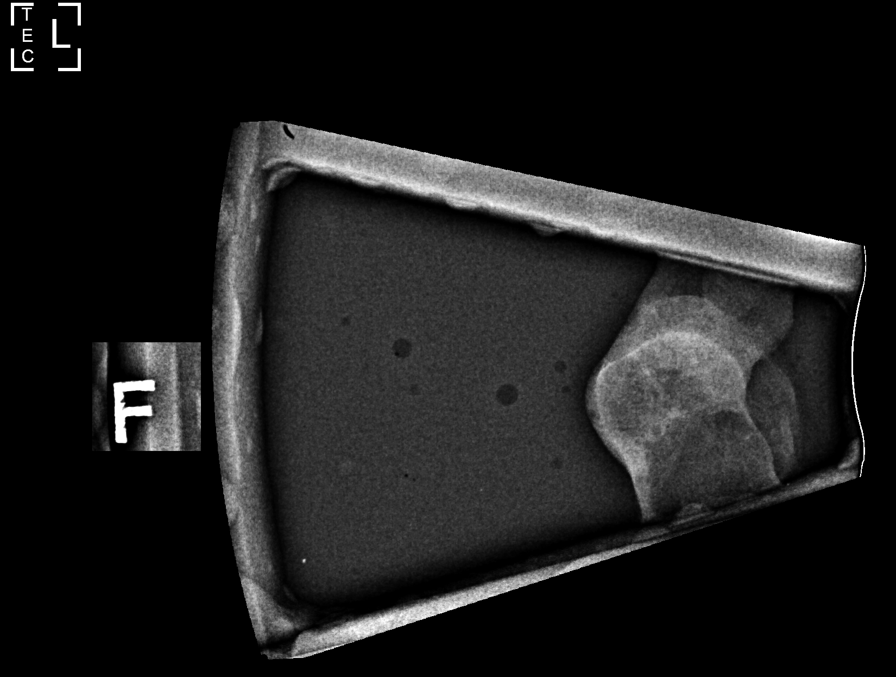

[L (4 of 6)]
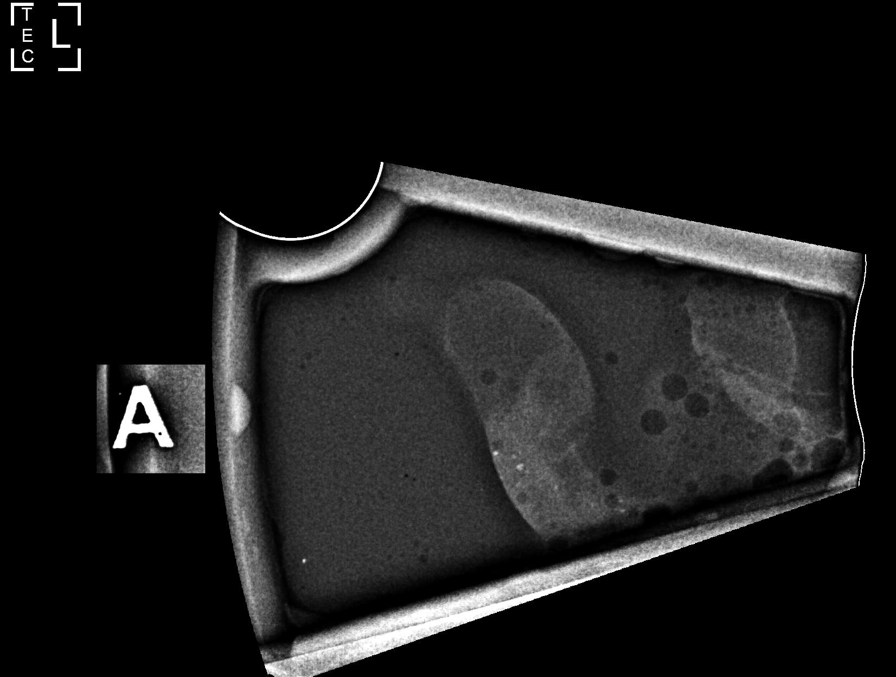

[L (5 of 6)]
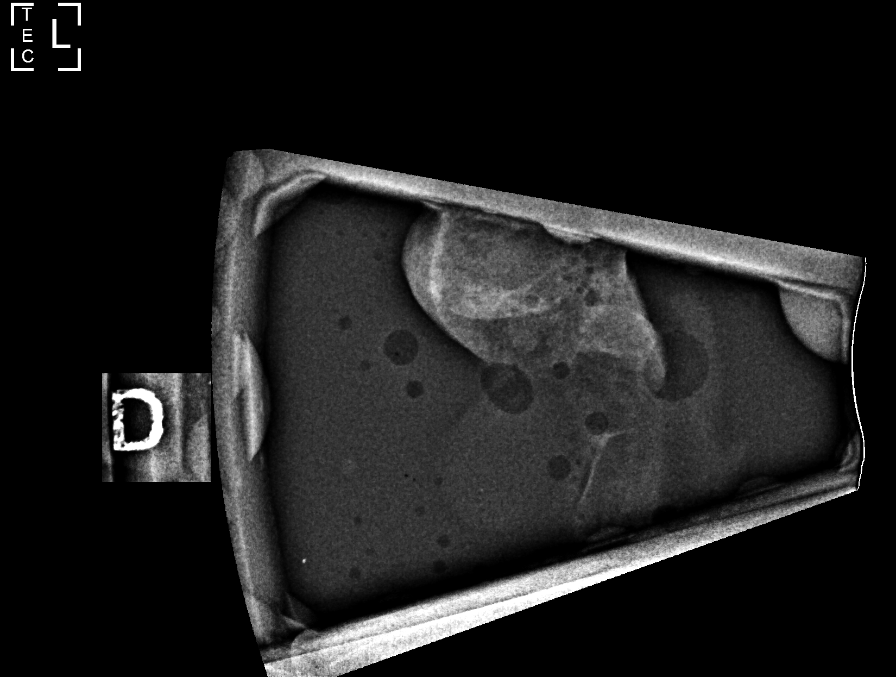

[L (6 of 6)]
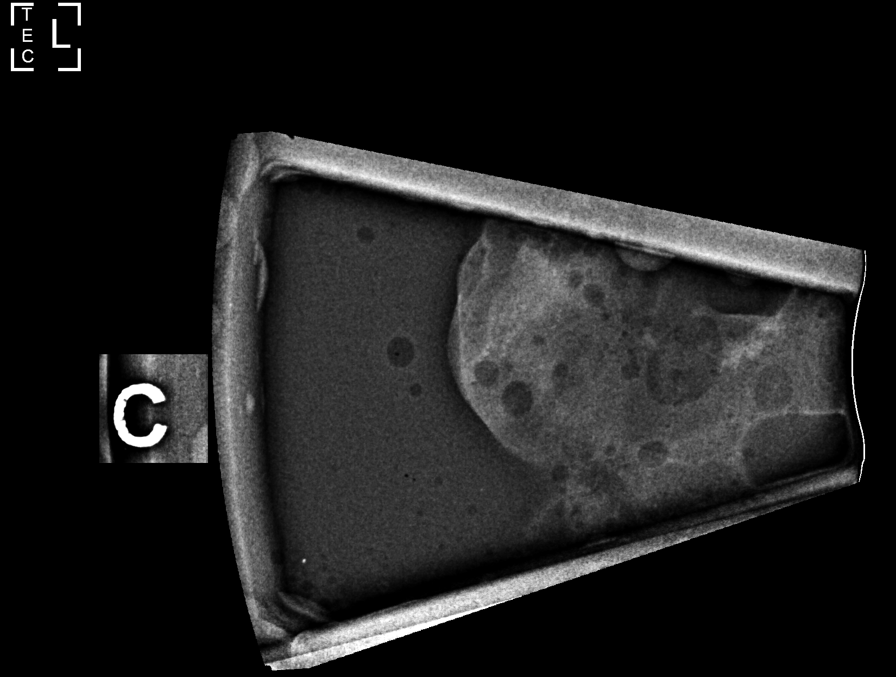

[L CC]
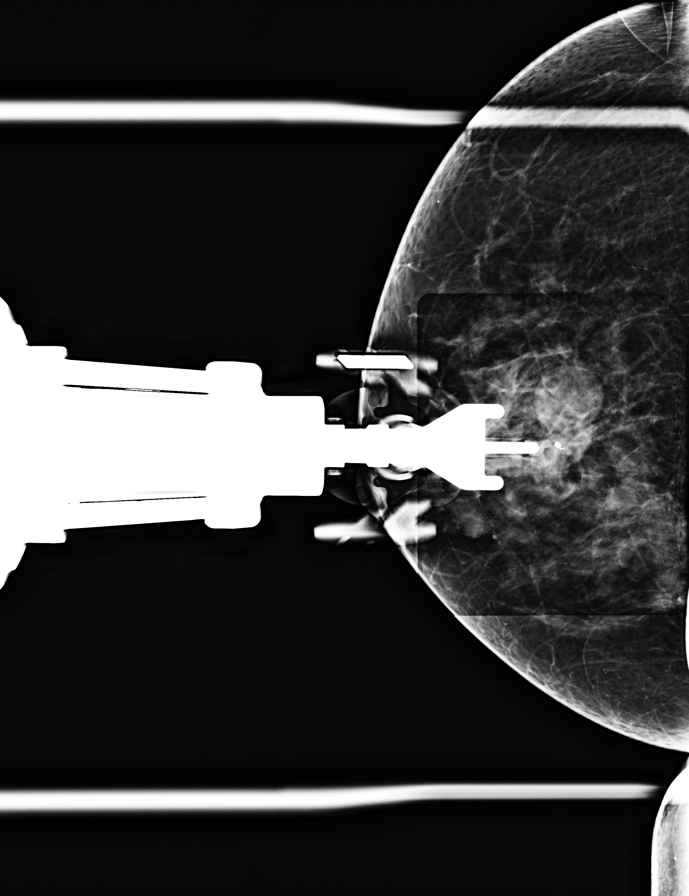

[L CC tomo · tomo slice 31/61.0]
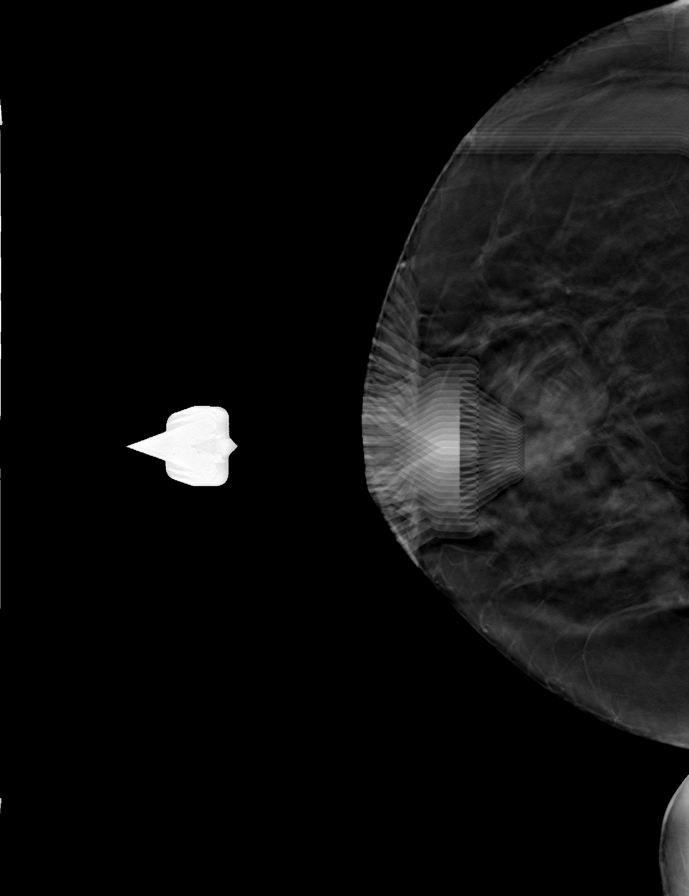

[8 of 17 positions shown; findings below may reference images not displayed]



Using sterile technique and 1% Lidocaine as local anesthetic, under
stereotactic guidance, a 9 gauge vacuum assisted device was used to
perform core needle biopsy of the distortion in the upper central to
slightly outer left breast using a superior to inferior approach.

Lesion quadrant: Upper-outer

At the conclusion of the procedure, a coil shaped tissue marker clip
was deployed into the biopsy cavity. Follow-up 2-view mammogram was
performed and dictated separately.
IMPRESSION: Stereotactic-guided biopsy of the distortion in the upper slightly
outer left breast. No apparent complications.

ADDENDUM:
Pathology revealed COMPLEX SCLEROSING LESION WITH USUAL DUCTAL
HYPERPLASIA, FIBROCYSTIC CHANGE WITH CALCIFICATIONS of the LEFT
breast. This was found to be concordant by Dr. MCMINN, with
excision recommended.

Pathology results were discussed with the patient by telephone. The
patient reported doing well after the biopsy with tenderness at the
site. Post biopsy instructions and care were reviewed and questions
were answered. The patient was encouraged to call The [REDACTED]

Surgical consultation has been arranged with Dr. MCMINN at
[REDACTED] on [DATE].

High risk screening MRI is recommended.

Pathology results reported by MCMINN RN on [DATE].



Using sterile technique and 1% Lidocaine as local anesthetic, under
stereotactic guidance, a 9 gauge vacuum assisted device was used to
perform core needle biopsy of the distortion in the upper central to
slightly outer left breast using a superior to inferior approach.

Lesion quadrant: Upper-outer

At the conclusion of the procedure, a coil shaped tissue marker clip
was deployed into the biopsy cavity. Follow-up 2-view mammogram was
performed and dictated separately.
IMPRESSION: Stereotactic-guided biopsy of the distortion in the upper slightly
outer left breast. No apparent complications.

## 2019-12-07 ENCOUNTER — Other Ambulatory Visit: Payer: Self-pay | Admitting: Surgery

## 2019-12-07 DIAGNOSIS — Z1239 Encounter for other screening for malignant neoplasm of breast: Secondary | ICD-10-CM

## 2019-12-13 ENCOUNTER — Telehealth: Payer: Self-pay | Admitting: Genetic Counselor

## 2019-12-13 NOTE — Telephone Encounter (Signed)
Received a genetic counseling referral from Dr. Corliss Skains for breast cancer. Autumn Patel has been cld and scheduled to see Autumn Patel on 5/18 at 2pm. Pt aware to arrive 15 minutes early.

## 2019-12-15 ENCOUNTER — Telehealth: Payer: Self-pay | Admitting: Genetic Counselor

## 2019-12-15 NOTE — Telephone Encounter (Signed)
Returned Autumn Patel's call regarding the COVID vaccine and her genetic counseling appointment. She is receiving the second vaccine on Saturday (5/15) and has her genetic counseling appointment scheduled for Tuesday (5/18). Discussed that vaccination should not impact genetic testing and there is no need to delay her genetic counseling/testing.

## 2019-12-21 ENCOUNTER — Other Ambulatory Visit: Payer: Self-pay

## 2019-12-21 ENCOUNTER — Inpatient Hospital Stay: Payer: BC Managed Care – PPO | Attending: Genetic Counselor | Admitting: Genetic Counselor

## 2019-12-21 ENCOUNTER — Inpatient Hospital Stay: Payer: BC Managed Care – PPO

## 2019-12-21 DIAGNOSIS — Z803 Family history of malignant neoplasm of breast: Secondary | ICD-10-CM

## 2019-12-21 DIAGNOSIS — Z806 Family history of leukemia: Secondary | ICD-10-CM | POA: Diagnosis not present

## 2019-12-22 ENCOUNTER — Encounter: Payer: Self-pay | Admitting: Genetic Counselor

## 2019-12-22 DIAGNOSIS — Z803 Family history of malignant neoplasm of breast: Secondary | ICD-10-CM | POA: Insufficient documentation

## 2019-12-22 DIAGNOSIS — Z806 Family history of leukemia: Secondary | ICD-10-CM | POA: Insufficient documentation

## 2019-12-22 NOTE — Progress Notes (Signed)
REFERRING PROVIDER: Donnie Mesa, MD [address] [place],  [place] [address]  PRIMARY PROVIDER:  Susy Frizzle, MD  PRIMARY REASON FOR VISIT:  1. Family history of breast cancer   2. Family history of leukemia      HISTORY OF PRESENT ILLNESS:   Autumn Patel, a 59 y.o. female, was seen for a [facility] cancer genetics consultation at the request of Dr. Georgette Dover due to a family history of young-onset breast cancer.  Autumn Patel presents to clinic today to discuss the possibility of a hereditary predisposition to cancer, genetic testing, and to further clarify her future cancer risks, as well as potential cancer risks for family members.   Autumn Patel does not have a personal history of cancer. Autumn Patel had previous genetic testing for hereditary cancer risks, although she could not remember when the testing was performed or how many genes the test analyzed. Her genetic test report was obtained after the visit and shows that Autumn Patel had negative genetic testing on 12/01/2014 of the BRCA1 and BRCA2 genes through [facility].    RISK FACTORS:  Menarche was at unknown age (patient estimates 42).  First live birth at age 73.  OCP use on and off for approximately 25 years.  Ovaries intact: yes.  Hysterectomy: no.  Menopausal status: postmenopausal.  HRT use: 0 years. Colonoscopy: yes; 05/14/2013 - 3 polyps detected. Mammogram within the last year: yes. Number of breast biopsies: 2. Any excessive radiation exposure in the past: no   Past Medical History:  Diagnosis Date  . Allergy   . Family history of breast cancer   . Family history of leukemia   . Hypertension     Past Surgical History:  Procedure Laterality Date  . CESAREAN SECTION    . CHOLECYSTECTOMY      Social History   Socioeconomic History  . Marital status: Married    Spouse name: Not on file  . Number of children: Not on file  . Years of education: Not on file  . Highest education level: Not on  file  Occupational History  . Not on file  Tobacco Use  . Smoking status: Never Smoker  . Smokeless tobacco: Never Used  Substance and Sexual Activity  . Alcohol use: No  . Drug use: No  . Sexual activity: Yes    Comment: married  Other Topics Concern  . Not on file  Social History Narrative  . Not on file   Social Determinants of Health   Financial Resource Strain:   . Difficulty of Paying Living Expenses:   Food Insecurity:   . Worried About Charity fundraiser in the Last Year:   . Arboriculturist in the Last Year:   Transportation Needs:   . Film/video editor (Medical):   Marland Kitchen Lack of Transportation (Non-Medical):   Physical Activity:   . Days of Exercise per Week:   . Minutes of Exercise per Session:   Stress:   . Feeling of Stress :   Social Connections:   . Frequency of Communication with Friends and Family:   . Frequency of Social Gatherings with Friends and Family:   . Attends Religious Services:   . Active Member of Clubs or Organizations:   . Attends Archivist Meetings:   Marland Kitchen Marital Status:      FAMILY HISTORY:  We obtained a detailed, 4-generation family history.  Significant diagnoses are listed below: Family History  Problem Relation Age  of Onset  . Hypertension Mother   . Heart disease Father   . Hypertension Father   . Diabetes Father   . Stroke Father   . Breast cancer Sister 14       dx. again age 65; bilateral mastectomies  . Vision loss Paternal Uncle   . Breast cancer Sister 53  . Multiple sclerosis Sister   . Leukemia Cousin 59   Autumn Patel has two sons (ages 54 and 68) and one daughter (age 77). She has two sisters (ages 50 and 46) and one brother (age 22). Her older sister has had breast cancer twice - first at the age of 4 and again at the age of 47. This sister had bilateral mastectomies. Autumn Patel younger sister had breast cancer diagnosed at the age of 108. She thinks both of her sisters have had genetic testing,  although she does not know when they had testing or if their tests were comprehensive of all genes known to be associated with breast cancer.  Autumn Patel mother is 43 years old and has not had cancer. Autumn Patel had four maternal aunts and eight maternal uncles. One uncle died as an infant. Her maternal grandmother died at the age of 73, and her maternal grandfather died at the age of 45. There are no known diagnoses of cancer on the maternal side of the family.   Autumn Patel father is 17 years old and has not had cancer. She has one paternal aunt and five paternal uncles. None of these aunts or uncles have had cancer. One of her paternal cousins died from leukemia in his early teen years. Her paternal grandparents died when they were older than 53 and did not have cancer. There are no other known diagnoses of cancer on the paternal side of the family.   Autumn Patel is aware of previous family history of genetic testing for hereditary cancer risks - in her sisters, although she is not sure what was included on their testing. Her ancestry is unknown. There is no reported Ashkenazi Jewish ancestry. There is no known consanguinity.  GENETIC COUNSELING ASSESSMENT: Autumn Patel is a 59 y.o. female with a family history of young-onset breast cancer which is somewhat suggestive of a hereditary cancer syndrome and predisposition to cancer. We, therefore, discussed and recommended the following at today's visit.   DISCUSSION: We discussed that 5 - 10% of breast cancer is hereditary, with most cases associated with the BRCA1 and BRCA2 genes. There are other genes that can be associated with hereditary breast cancer syndromes. These include ATM, CHEK2, PALB2, etc. We discussed that testing is beneficial for several reasons, including knowing about other cancer risks, identifying potential screening and risk-reduction options that may be appropriate, and to understand if other family members could be at risk for cancer and  allow them to undergo genetic testing.  Autumn Patel has had previous genetic testing for the BRCA1 and BRCA2 genes in 2016. We discussed that there are other genes known to be associated with breast cancer that were not included on her previous test. If a gene mutation is found in one of these breast cancer genes, it would impact her medical management recommendations.  We reviewed the characteristics, features and inheritance patterns of hereditary cancer syndromes. We also discussed genetic testing, including the appropriate family members to test, the process of testing, insurance coverage and turn-around-time for results. We discussed the implications of a negative, positive and/or variant of uncertain significant result. We recommended  Autumn Patel pursue genetic testing for the Common Hereditary Cancers panel.   The Common Hereditary Cancers Panel offered by [facility] includes sequencing and/or deletion duplication testing of the following 48 genes: APC, ATM, AXIN2, BARD1, BMPR1A, BRCA1, BRCA2, BRIP1, CDH1, CDK4, CDKN2A (p14ARF), CDKN2A (p16INK4a), CHEK2, CTNNA1, DICER1, EPCAM (Deletion/duplication testing only), GREM1 (promoter region deletion/duplication testing only), KIT, MEN1, MLH1, MSH2, MSH3, MSH6, MUTYH, NBN, NF1, NHTL1, PALB2, PDGFRA, PMS2, POLD1, POLE, PTEN, RAD50, RAD51C, RAD51D, RNF43, SDHB, SDHC, SDHD, SMAD4, SMARCA4. STK11, TP53, TSC1, TSC2, and VHL.  The following genes are evaluated for sequence changes only: SDHA and HOXB13 c.251G>A variant only.   Based on Autumn Patel's family history of cancer, she meets medical criteria for genetic testing. Despite that she meets criteria, she may still have an out of pocket cost. We discussed that if her out of pocket cost for testing is over $100, the laboratory will call and confirm whether she wants to proceed with testing.  If the out of pocket cost of testing is less than $100 she will be billed by the genetic testing laboratory.   PLAN: After  considering the risks, benefits, and limitations, Autumn Patel provided informed consent to pursue genetic testing and the blood sample was sent to [facility] for analysis of the Common Hereditary Cancers panel. Results should be available within approximately two-three weeks' time, at which point they will be disclosed by telephone to Ms. Besser, as will any additional recommendations warranted by these results. Ms. Homewood will receive a summary of her genetic counseling visit and a copy of her results once available. This information will also be available in Epic.   Ms. Alameda questions were answered to her satisfaction today. Our contact information was provided should additional questions or concerns arise. Thank you for the referral and allowing [place] to share in the care of your patient.   Clint Guy, [place], [facility] Licensed, Certified Dispensing [email or web address] Phone: [phone]  The patient was seen for a total of 40 minutes in face-to-face genetic counseling.  This patient was discussed with Drs. Magrinat, Lindi Adie and/or Burr Medico who agrees with the above.    _______________________________________________________________________ For Office Staff:  Number of people involved in session: 1 Was an Intern/ student involved with case: no

## 2020-01-18 ENCOUNTER — Telehealth: Payer: Self-pay | Admitting: Genetic Counselor

## 2020-01-18 NOTE — Telephone Encounter (Signed)
Patient called to inquire about a document that was scanned into MyChart under "molecular pathology". Discussed that this was the pedigree (family history) that had been taken at her genetic counseling appointment on 12/21/19, and not her genetic test results.  Upon further review of the status of her testing, it appears that the genetic testing laboratory never received her blood sample. We have scheduled her to come in and have her blood redrawn at 3:45pm tomorrow.

## 2020-01-19 ENCOUNTER — Telehealth: Payer: Self-pay | Admitting: Genetic Counselor

## 2020-01-19 ENCOUNTER — Other Ambulatory Visit: Payer: BC Managed Care – PPO

## 2020-01-19 NOTE — Telephone Encounter (Signed)
LVM to let Autumn Patel know that after speaking with the genetic testing laboratory, they were able to find her sample and are processing it now. Therefore it is no longer necessary to have her blood redrawn for genetic testing. Requested that she call back to discuss any questions. We will leave the lab appointment as is unless she calls back to cancel.

## 2020-01-19 NOTE — Telephone Encounter (Signed)
Autumn Patel called back to cancel her lab appointment for genetics today. We will reach out to her once we have her results.

## 2020-01-27 ENCOUNTER — Ambulatory Visit: Payer: Self-pay | Admitting: Genetic Counselor

## 2020-01-27 ENCOUNTER — Telehealth: Payer: Self-pay | Admitting: Genetic Counselor

## 2020-01-27 DIAGNOSIS — Z1379 Encounter for other screening for genetic and chromosomal anomalies: Secondary | ICD-10-CM

## 2020-01-27 NOTE — Telephone Encounter (Signed)
Revealed negative genetic testing. Discussed that we do not know why she has breast cancer or why there is cancer in the family. There could be a genetic mutation in the family that Ms. Caldron did not inherit. There could also be a mutation in a different gene that we are not testing, or our current technology may not be able detect certain mutations. It will therefore be important for her to stay in contact with genetics to keep up with whether additional testing may be appropriate in the future.   According to breast cancer risk models, Ms. Grothe has a high risk for breast cancer. We therefore reviewed recommendations for additional breast cancer screening and consideration of chemoprevention.   A variant of uncertain significance was detected in the DICER1 gene called c.971T>C. Her result is still considered normal at this time and should not impact her medical management.

## 2020-01-28 ENCOUNTER — Ambulatory Visit: Payer: BC Managed Care – PPO | Admitting: Podiatry

## 2020-01-28 ENCOUNTER — Ambulatory Visit
Admission: RE | Admit: 2020-01-28 | Discharge: 2020-01-28 | Disposition: A | Discharge status: Home / Self Care | Payer: BC Managed Care – PPO | Source: Ambulatory Visit | Attending: Surgery | Admitting: Surgery

## 2020-01-28 ENCOUNTER — Encounter: Payer: Self-pay | Admitting: Genetic Counselor

## 2020-01-28 ENCOUNTER — Other Ambulatory Visit: Payer: Self-pay

## 2020-01-28 DIAGNOSIS — M2041 Other hammer toe(s) (acquired), right foot: Secondary | ICD-10-CM

## 2020-01-28 DIAGNOSIS — Z1379 Encounter for other screening for genetic and chromosomal anomalies: Secondary | ICD-10-CM | POA: Insufficient documentation

## 2020-01-28 DIAGNOSIS — M2042 Other hammer toe(s) (acquired), left foot: Secondary | ICD-10-CM

## 2020-01-28 DIAGNOSIS — Z1239 Encounter for other screening for malignant neoplasm of breast: Secondary | ICD-10-CM

## 2020-01-28 DIAGNOSIS — L989 Disorder of the skin and subcutaneous tissue, unspecified: Secondary | ICD-10-CM

## 2020-01-28 IMAGING — MR MR BREAST BILAT WO/W CM
8 of 12 series · 32 of 48 positions shown · IV contrast (gadavist)
Comparison: Prior mammogram and ultrasound

CLINICAL DATA: 58-year-old female for screening breast MRI given
high lifetime risk. Recent benign LEFT breast biopsy.

LABS:  Not performed today
EXAM:
BILATERAL BREAST MRI WITH AND WITHOUT CONTRAST
TECHNIQUE: Multiplanar, multisequence MR images of both breasts were obtained
prior to and following the intravenous administration of 8 ml of
Gadavist

[Series 2: t2_tirm_tra ipat (a-p) · axial · 3.0mm · 0.66mm/px · 1 of 58 slices shown]
[im 1/58]
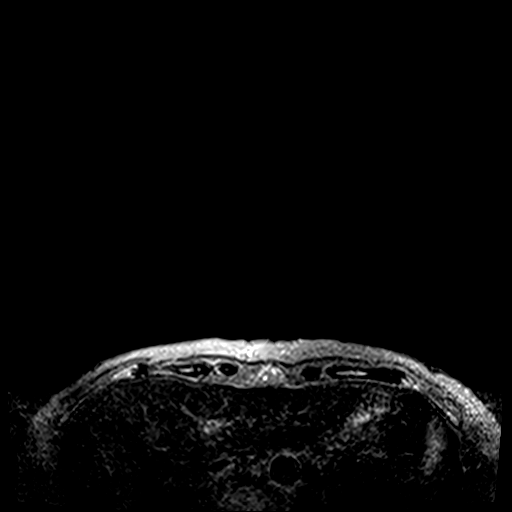

[Series 3: fl3d pre-cm no · axial · non-contrast · 1.2mm · 0.89mm/px · z∈[-111,+60]mm · 5 of 144 slices shown]
[im 1/144]
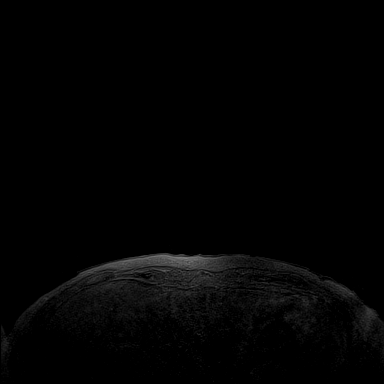
[im 36/144]
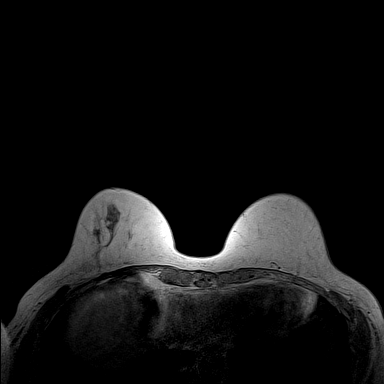
[im 72/144]
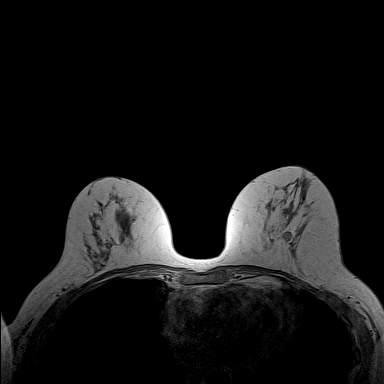
[im 108/144]
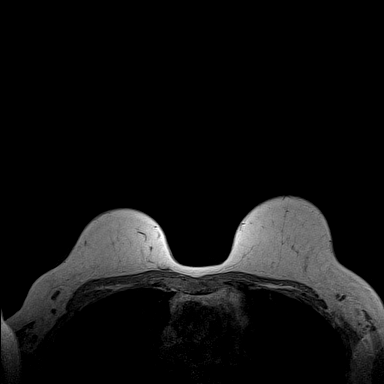
[im 144/144]
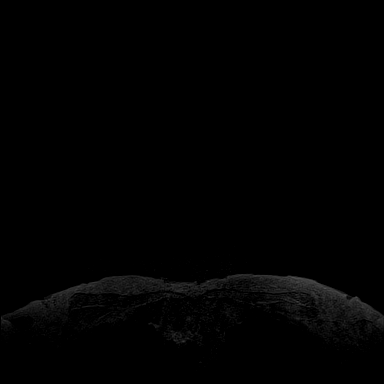

[Series 4: fl3d pre-cm · axial · non-contrast · 1.2mm · 0.89mm/px · z∈[-111,+60]mm · 5 of 144 slices shown]
[im 1/144]
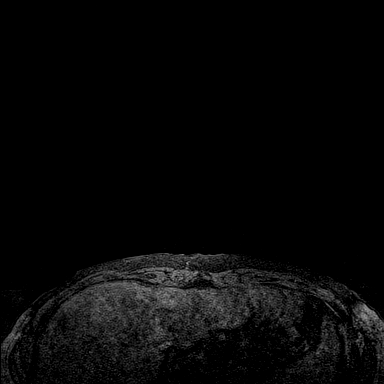
[im 36/144]
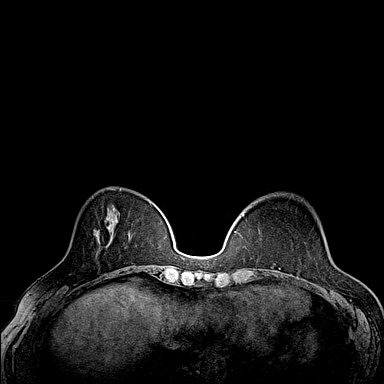
[im 72/144]
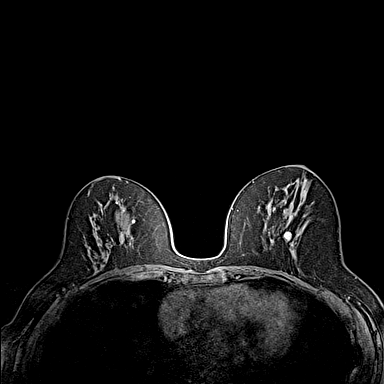
[im 108/144]
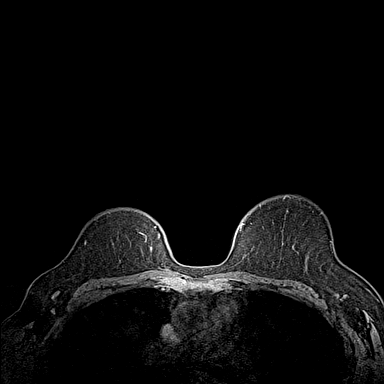
[im 144/144]
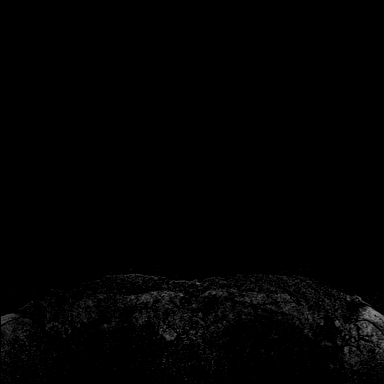

[Series 5: fl3d post-cm 20 · axial · 1.2mm · 0.89mm/px · z∈[-111,+60]mm · 5 of 144 slices shown (1 of 3)]
[im 1/144]
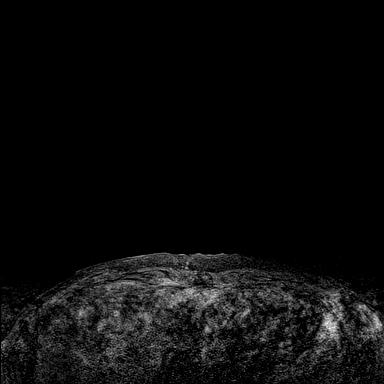
[im 36/144]
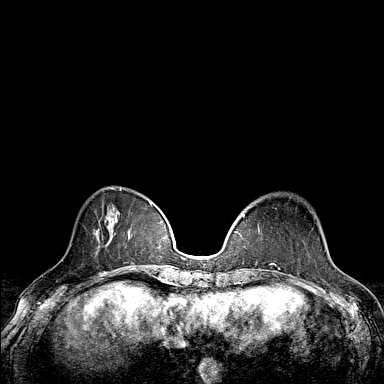
[im 72/144]
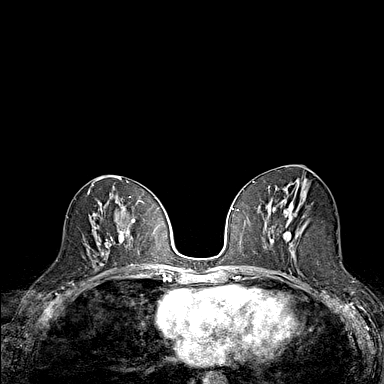
[im 108/144]
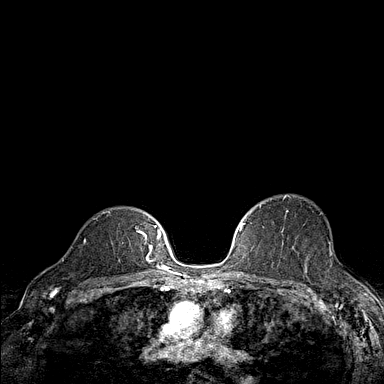
[im 144/144]
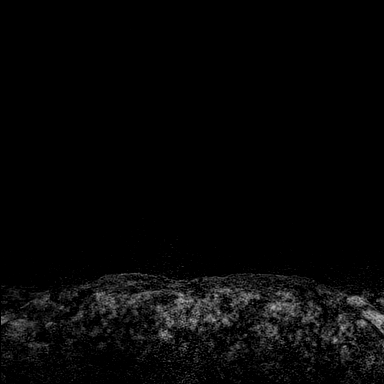

[Series 6: fl3d post-cm 20 · axial · 1.2mm · 0.89mm/px · z∈[-111,+60]mm · 5 of 144 slices shown (2 of 3)]
[im 1/144]
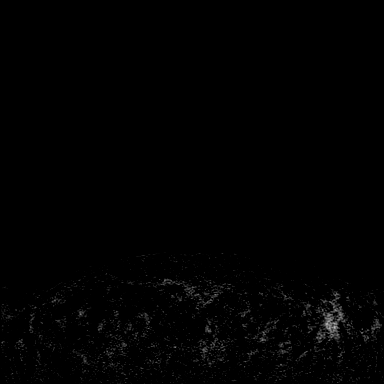
[im 36/144]
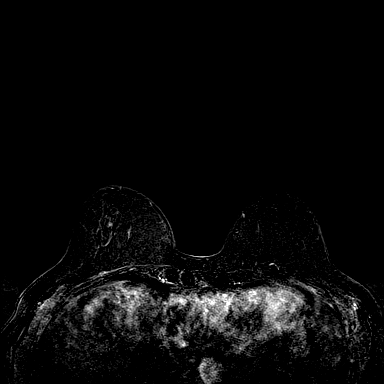
[im 72/144]
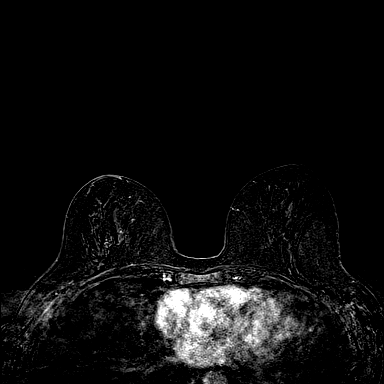
[im 108/144]
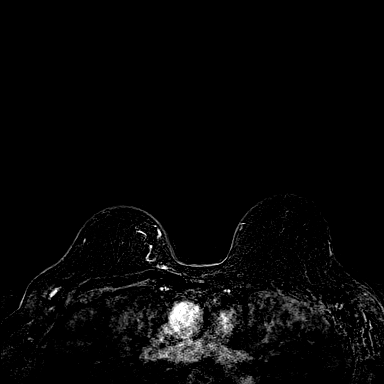
[im 144/144]
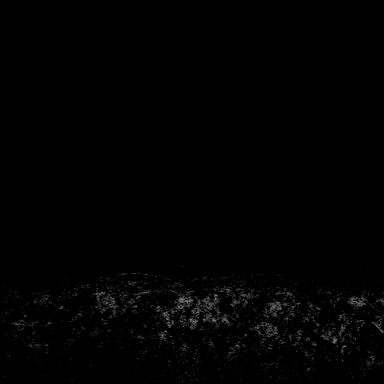

[Series 7: fl3d post-cm 20 · axial · 172.8mm · 0.89mm/px · 1 of 1 slices shown (3 of 3)]
[im 1/1]
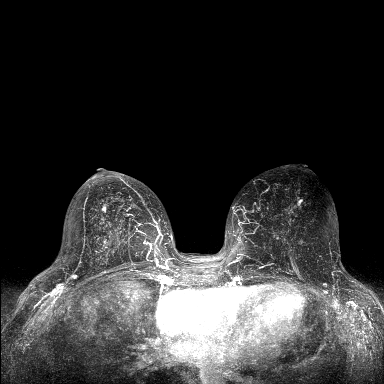

[Series 8: fl3d post-cm 3min · axial · 1.2mm · 0.89mm/px · z∈[-111,+60]mm · 6 of 144 slices shown]
[im 1/144]
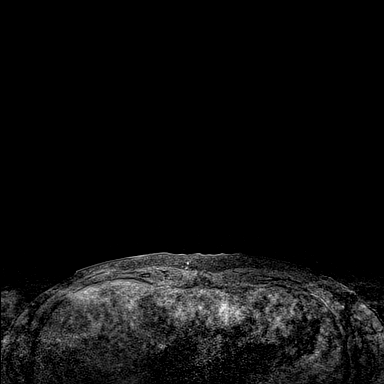
[im 29/144]
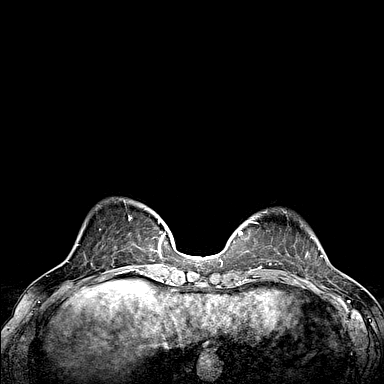
[im 58/144]
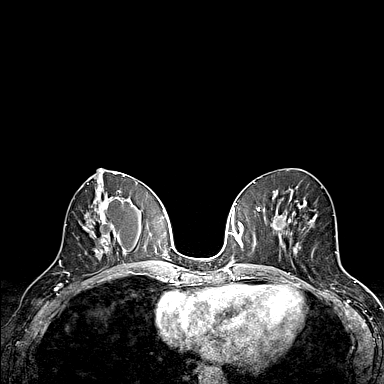
[im 86/144]
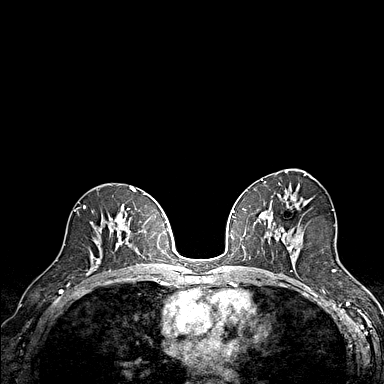
[im 115/144]
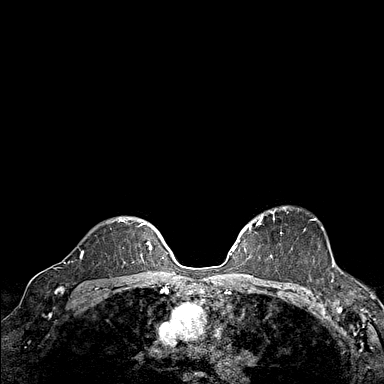
[im 144/144]
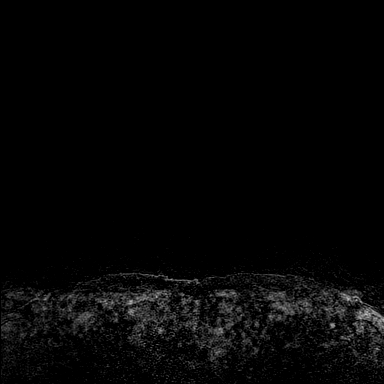

[Series 9: fl3d post-cm 3min_sub · axial · 1.2mm · 0.89mm/px · z∈[-111,-9]mm · 4 of 144 slices shown]
[im 1/144]
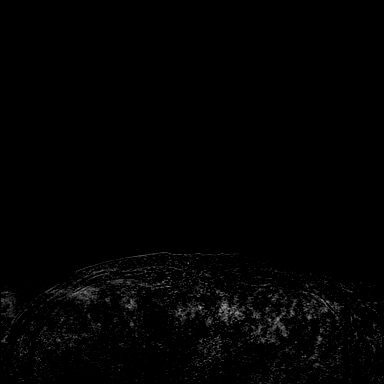
[im 29/144]
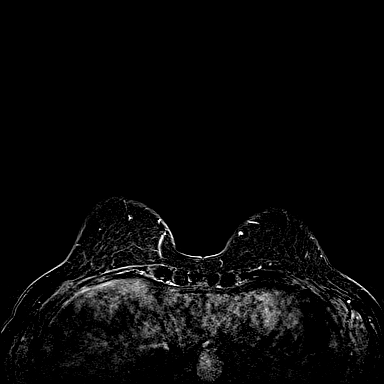
[im 58/144]
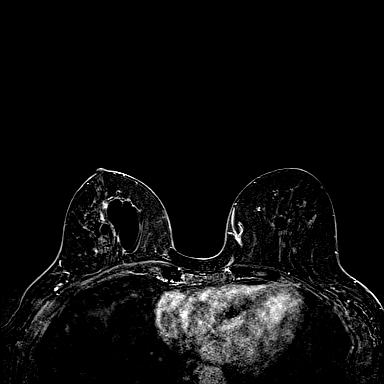
[im 86/144]
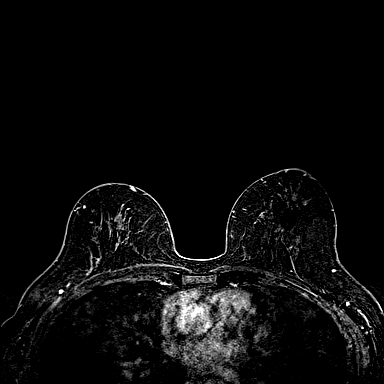

[32 of 48 positions shown; findings below may reference images not displayed]

Three-dimensional MR images were rendered by post-processing of the
original MR data on an independent workstation. The
three-dimensional MR images were interpreted, and findings are
reported in the following complete MRI report for this study. Three
dimensional images were evaluated at the independent [employer]
workstation
FINDINGS: Breast composition: c. Heterogeneous fibroglandular tissue.

Background parenchymal enhancement: Mild

Right breast: Multiple cysts are noted, the largest measuring 5 cm
within the slightly OUTER central RIGHT breast. Along the
anterolateral aspect of this 5 cm cyst is a 0.6 cm enhancing mass
(image 84: Series 6) located within the slightly UPPER central RIGHT
breast. No other suspicious areas of enhancement are noted.

Left breast: A 1 cm area of slightly linear enhancement is noted
within the slightly UPPER central LEFT breast ([DATE]).

Multiple cysts are noted.

No other suspicious areas of enhancement are noted.

Lymph nodes: No abnormal appearing lymph nodes.

Ancillary findings:  None.
IMPRESSION: 1. Indeterminate 0.6 cm mass within slightly UPPER RIGHT breast,
adjacent to a 5 cm cyst. Tissue sampling is recommended.
2. Indeterminate 1 cm area of linear enhancement within the slightly
UPPER LEFT breast. Tissue sampling recommended.
3. Multiple cysts within both breast.
4. No abnormal appearing lymph nodes.

RECOMMENDATION:
MR guided biopsies of 0.6 cm UPPER RIGHT breast mass and 1 cm UPPER
LEFT br[REDACTED]ar enhancement.

BI-RADS CATEGORY  4: Suspicious.

## 2020-01-28 MED ORDER — GADOBUTROL 1 MMOL/ML IV SOLN
8.0000 mL | Freq: Once | INTRAVENOUS | Status: AC | PRN
  Administered 2020-01-28: 8 mL via INTRAVENOUS

## 2020-01-28 NOTE — Progress Notes (Signed)
HPI:  Autumn Patel was previously seen in the [facility] due to a family history of breast cancer and concerns regarding a hereditary predisposition to cancer. Please refer to our prior cancer genetics clinic note for more information regarding our discussion, assessment and recommendations, at the time. Autumn Patel recent genetic test results were disclosed to her, as were recommendations warranted by these results. These results and recommendations are discussed in more detail below.  FAMILY HISTORY:  We obtained a detailed, 4-generation family history.  Significant diagnoses are listed below: Family History  Problem Relation Age of Onset  . Hypertension Mother   . Heart disease Father   . Hypertension Father   . Diabetes Father   . Stroke Father   . Breast cancer Sister 68       dx. again age 86; bilateral mastectomies  . Vision loss Paternal Uncle   . Breast cancer Sister 90  . Multiple sclerosis Sister   . Leukemia Cousin 92   Autumn Patel has two sons (ages 5 and 54) and one daughter (age 87). She has two sisters (ages 69 and 82) and one brother (age 39). Her older sister has had breast cancer twice - first at the age of 47 and again at the age of 39. This sister had bilateral mastectomies. Autumn Patel younger sister had breast cancer diagnosed at the age of 65. She thinks both of her sisters have had genetic testing, although she does not know when they had testing or if their tests were comprehensive of all genes known to be associated with breast cancer.  Autumn Patel mother is 89 years old and has not had cancer. Autumn Patel had four maternal aunts and eight maternal uncles. One uncle died as an infant. Her maternal grandmother died at the age of 85, and her maternal grandfather died at the age of 23. There are no known diagnoses of cancer on the maternal side of the family.   Autumn Patel father is 29 years old and has not had cancer. She has one paternal aunt and  five paternal uncles. None of these aunts or uncles have had cancer. One of her paternal cousins died from leukemia in his early teen years. Her paternal grandparents died when they were older than 6 and did not have cancer. There are no other known diagnoses of cancer on the paternal side of the family.   Autumn Patel is aware of previous family history of genetic testing for hereditary cancer risks - in her sisters, although she is not sure what was included on their testing. Her ancestry is unknown. There is no reported Ashkenazi Jewish ancestry. There is no known consanguinity.  GENETIC TEST RESULTS: Genetic testing reported out on 01/26/20 through the [facility] Common Hereditary Cancers panel. No pathogenic variants were detected.   The Common Hereditary Cancers Panel offered by [facility] includes sequencing and/or deletion duplication testing of the following 48 genes: APC, ATM, AXIN2, BARD1, BMPR1A, BRCA1, BRCA2, BRIP1, CDH1, CDK4, CDKN2A (p14ARF), CDKN2A (p16INK4a), CHEK2, CTNNA1, DICER1, EPCAM (Deletion/duplication testing only), GREM1 (promoter region deletion/duplication testing only), KIT, MEN1, MLH1, MSH2, MSH3, MSH6, MUTYH, NBN, NF1, NHTL1, PALB2, PDGFRA, PMS2, POLD1, POLE, PTEN, RAD50, RAD51C, RAD51D, RNF43, SDHB, SDHC, SDHD, SMAD4, SMARCA4. STK11, TP53, TSC1, TSC2, and VHL.  The following genes were evaluated for sequence changes only: SDHA and HOXB13 c.251G>A variant only. The test report will be scanned into EPIC and located under the Molecular Pathology section of the Results Review tab.  A  portion of the result report is included below for reference.     We discussed with Autumn Patel that because current genetic testing is not perfect, it is possible there may be a gene mutation in one of these genes that current testing cannot detect, but that chance is small.  We also discussed, that there could be another gene that has not yet been discovered, or that we have not yet tested, that is  responsible for the cancer diagnoses in the family. It is also possible there is a hereditary cause for the cancer in the family that Autumn Patel did not inherit and therefore was not identified in her testing. Therefore, it is important to remain in touch with cancer genetics in the future so that we can continue to offer Autumn Patel the most up to date genetic testing.   Genetic testing did identify a variant of uncertain significance (VUS) in the DICER1 gene called c.917T>C.  At this time, it is unknown if this variant is associated with increased cancer risk or if this is a normal finding, but most variants such as this get reclassified to being inconsequential. It should not be used to make medical management decisions. With time, we suspect the lab will determine the significance of this variant, if any. If we do learn more about it, we will try to contact [name] to discuss it further. However, it is important to stay in touch with [place] periodically and keep the address and phone number up to date.  CANCER SCREENING RECOMMENDATIONS: Autumn Patel test result is considered negative (normal).  This means that we have not identified a hereditary cause for her family history of cancer at this time. While reassuring, this does not definitively rule out a hereditary predisposition to cancer. It is still possible that there could be genetic mutations that are undetectable by current technology. There could be genetic mutations in genes that have not been tested or identified to increase cancer risk.  Therefore, it is recommended she continue to follow the cancer management and screening guidelines provided by her primary healthcare provider.   An individual's cancer risk and medical management are not determined by genetic test results alone. Overall cancer risk assessment incorporates additional factors, including personal medical history, family history, and any available genetic information that may result in a  personalized plan for cancer prevention and surveillance.  Based on Autumn Patel's personal and family history, as well as her genetic test results, statistical models (Tyrer-Cuzick and the Sunflower) were used to estimate her risk of developing breast cancer. Tyrer-Cuzick estimates her lifetime risk of developing breast cancer to be approximately 57.2%. This lifetime breast cancer risk is a preliminary estimate based on available information using one of several models endorsed by the [place] (ACS). The ACS recommends consideration of breast MRI screening as an adjunct to mammography for patients at high risk (defined as 20% or greater lifetime risk). The [place] Jan[place] model estimates her 5-year risk for breast cancer to be approximately 6.6%. Consideration for chemoprevention is recommended for women who have a 5-year breast cancer risk of 1.7% or greater.  Autumn Patel has been determined to be at high risk for breast cancer. Therefore, we recommend that her breast cancer screening includes annual breast MRIs in additional to annual mammograms. We discussed that Autumn Patel should discuss her individual situation with her referring physician and determine a breast cancer screening plan with which they are both comfortable.    Tyrer-Cuzick:  The [place] Model:    RECOMMENDATIONS FOR FAMILY MEMBERS:  Individuals in this family might be at some increased risk of developing cancer, over the general population risk, simply due to the family history of cancer.  We recommended women in this family have a yearly mammogram beginning at age 58, or 60 years younger than the earliest onset of cancer, an annual clinical breast exam, and perform monthly breast self-exams. Women in this family should also have a gynecological exam as recommended by their primary provider. All family members should be referred for colonoscopy starting at age 74.  It is also possible there is a hereditary cause for the cancer in Ms.  Patel's family that she did not inherit and therefore was not identified in her.  Based on Autumn Patel's family history, we recommended her sisters, who were diagnosed with breast cancer at age 26, have genetic counseling and testing. Ms. Megill will let us know if we can be of any assistance in coordinating genetic counseling and/or testing for this family member.   FOLLOW-UP: Lastly, we discussed with Ms. Grisanti that cancer genetics is a rapidly advancing field and it is possible that new genetic tests will be appropriate for her and/or her family members in the future. We encouraged her to remain in contact with cancer genetics on an annual basis so we can update her personal and family histories and let her know of advances in cancer genetics that may benefit this family.   Our contact number was provided. Ms. Andrzejewski questions were answered to her satisfaction, and she knows she is welcome to call us at anytime with additional questions or concerns.   Clint Guy, MS, [facility] Genetic Counselor [email or web address] Phone: [phone]

## 2020-01-30 ENCOUNTER — Encounter: Payer: Self-pay | Admitting: Podiatry

## 2020-01-30 NOTE — Progress Notes (Signed)
Subjective:  Patient ID: Autumn Patel, female    DOB: Aug 31, 1960,  MRN: [id number]  Chief Complaint  Patient presents with  . Callouses    pt is here for a callus trim    59 y.o. female presents with the above complaint.  Patient presents with complaint of bilateral lateral fifth digit hyperkeratotic lesion/benign skin lesion that has been causing her a lot of pain.  Patient states that it hurts when ambulating or wearing school toe shoes.  She has not tried anything.  She states that she has tried soaking it which has not helped.  She denies any other acute complaints.  She would like to have them debrided down/think about various treatment options.  She has not tried any toe protectors or paddings.  She has not made any shoe gear modification.  She has not seen anyone else prior to seeing me.  Her pain scale 7 out of 10.  And is dull achy in nature.   Review of Systems: Negative except as noted in the HPI. Denies N/V/F/Ch.  Past Medical History:  Diagnosis Date  . Allergy   . Family history of breast cancer   . Family history of leukemia   . Hypertension     Current Outpatient Medications:  .  diazepam (VALIUM) 10 MG tablet, Take 10 mg by mouth as needed., Disp: , Rfl:  .  hydrochlorothiazide (HYDRODIURIL) 12.5 MG tablet, Take 12.5 mg by mouth daily., Disp: , Rfl:  .  HYDROcodone-homatropine (HYCODAN) 5-1.5 MG/5ML syrup, Take 5 mLs by mouth every 8 (eight) hours as needed for cough., Disp: 120 mL, Rfl: 0 .  lidocaine HCl, PF, (XYLOCAINE) 2 % SOLN injection, lidocaine (PF) 20 mg/mL (2 %) injection solution  Take 5 mL by injection route., Disp: , Rfl:  .  phentermine (ADIPEX-P) 37.5 MG tablet, Take 1 tablet (37.5 mg total) by mouth daily before breakfast., Disp: 30 tablet, Rfl: 2 .  valACYclovir (VALTREX) 500 MG tablet, valacyclovir 500 mg tablet, Disp: , Rfl:  .  zolpidem (AMBIEN) 10 MG tablet, Take 1 tablet (10 mg total) by mouth at bedtime as needed., Disp: 15 tablet, Rfl:  1  Social History   Tobacco Use  Smoking Status Never Smoker  Smokeless Tobacco Never Used    No Known Allergies Objective:  There were no vitals filed for this visit. There is no height or weight on file to calculate BMI. Constitutional Well developed. Well nourished.  Vascular Dorsalis pedis pulses palpable bilaterally. Posterior tibial pulses palpable bilaterally. Capillary refill normal to all digits.  No cyanosis or clubbing noted. Pedal hair growth normal.  Neurologic Normal speech. Oriented to person, place, and time. Epicritic sensation to light touch grossly present bilaterally.  Dermatologic  hyperkeratotic lesion noted to the bilateral fifth digit lateral aspect.  No porokeratosis noted.  Adductovarus rotation of the digit noted.  Bilateral hammertoe contractures noted.  They are semiflexible in nature of the fifth digit.  Orthopedic: Normal joint ROM without pain or crepitus bilaterally. No visible deformities. No bony tenderness.   Radiographs: None Assessment:   1. Benign skin lesion   2. Hammer toes of both feet    Plan:  Patient was evaluated and treated and all questions answered.  Bilateral fifth digit hammertoe contracture with associated benign skin lesion -I explained to the patient the etiology of hammertoe contractures and various treatment options were extensively discussed.  I explained to her that shoe gear modification with wide shoes and new balance sneakers will definitely help  take some of the pressure away from the lateral side of the fifth digit.  Patient states understanding will work on that.  I also discussed with her that she can benefit from debridement of the corns as well.  She agrees with that and she would like to have them debrided down as well. -Using chisel blade and handle, the hyperkeratotic lesions were debrided down to healthy striated tissue no pinpoint bleeding noted no complications noted.  No follow-ups on file.

## 2020-02-01 ENCOUNTER — Other Ambulatory Visit: Payer: Self-pay | Admitting: Surgery

## 2020-02-01 DIAGNOSIS — N6310 Unspecified lump in the right breast, unspecified quadrant: Secondary | ICD-10-CM

## 2020-02-01 DIAGNOSIS — N6320 Unspecified lump in the left breast, unspecified quadrant: Secondary | ICD-10-CM

## 2020-02-03 ENCOUNTER — Other Ambulatory Visit: Payer: Self-pay | Admitting: Surgery

## 2020-02-03 DIAGNOSIS — N6310 Unspecified lump in the right breast, unspecified quadrant: Secondary | ICD-10-CM

## 2020-02-03 DIAGNOSIS — N6320 Unspecified lump in the left breast, unspecified quadrant: Secondary | ICD-10-CM

## 2020-02-17 ENCOUNTER — Other Ambulatory Visit (HOSPITAL_COMMUNITY): Payer: Self-pay | Admitting: Diagnostic Radiology

## 2020-02-17 ENCOUNTER — Ambulatory Visit
Admission: RE | Admit: 2020-02-17 | Discharge: 2020-02-17 | Disposition: A | Discharge status: Home / Self Care | Payer: BC Managed Care – PPO | Source: Ambulatory Visit | Attending: Surgery | Admitting: Surgery

## 2020-02-17 ENCOUNTER — Other Ambulatory Visit: Payer: Self-pay

## 2020-02-17 DIAGNOSIS — N6310 Unspecified lump in the right breast, unspecified quadrant: Secondary | ICD-10-CM

## 2020-02-17 DIAGNOSIS — N6320 Unspecified lump in the left breast, unspecified quadrant: Secondary | ICD-10-CM

## 2020-02-17 IMAGING — MG MM BREAST LOCALIZATION CLIP
4 series · 4 of 12 positions shown · non-contrast
Comparison: Previous exam(s).

CLINICAL DATA: Evaluate post biopsy marker clip placement following
MRI guided biopsy of enhancing lesions, 1 in each breast.

EXAM:
DIAGNOSTIC BILATERAL MAMMOGRAM POST MRI BIOPSY

[R CC synth-2D]
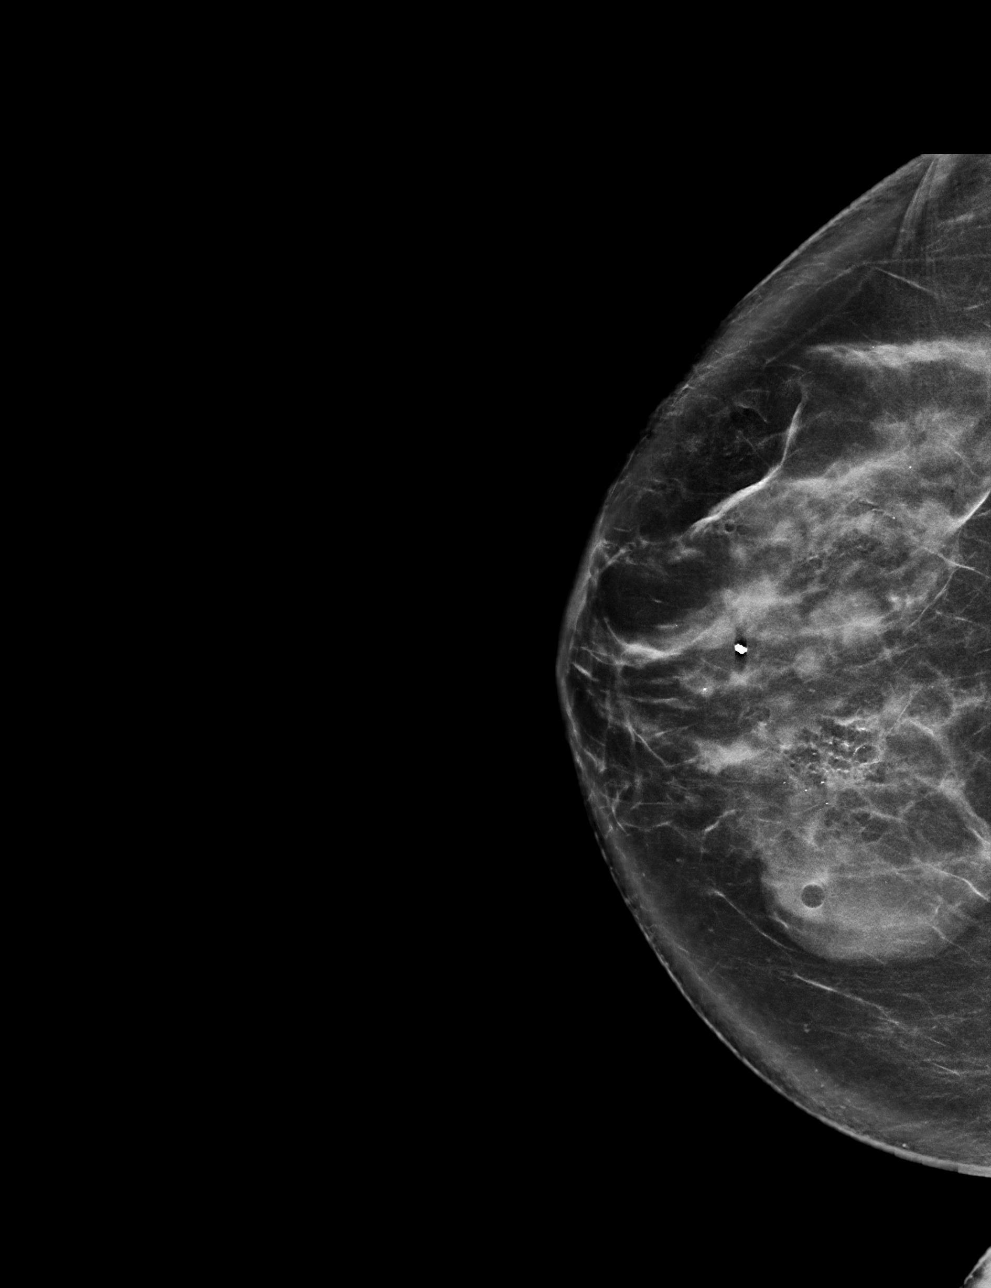

[R ML synth-2D]
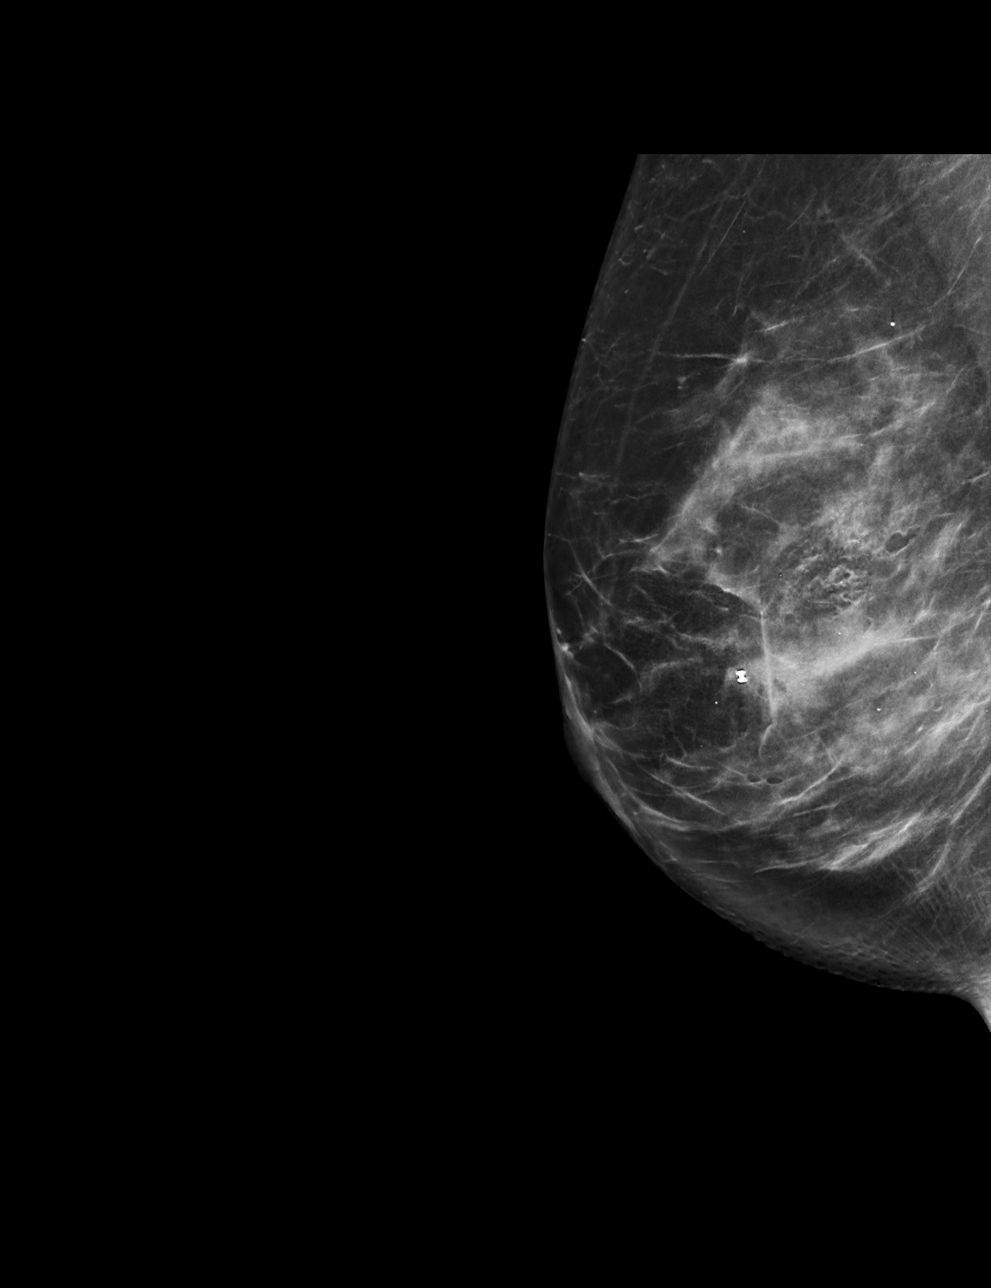

[R CC tomo · tomo slice 39/78.0]
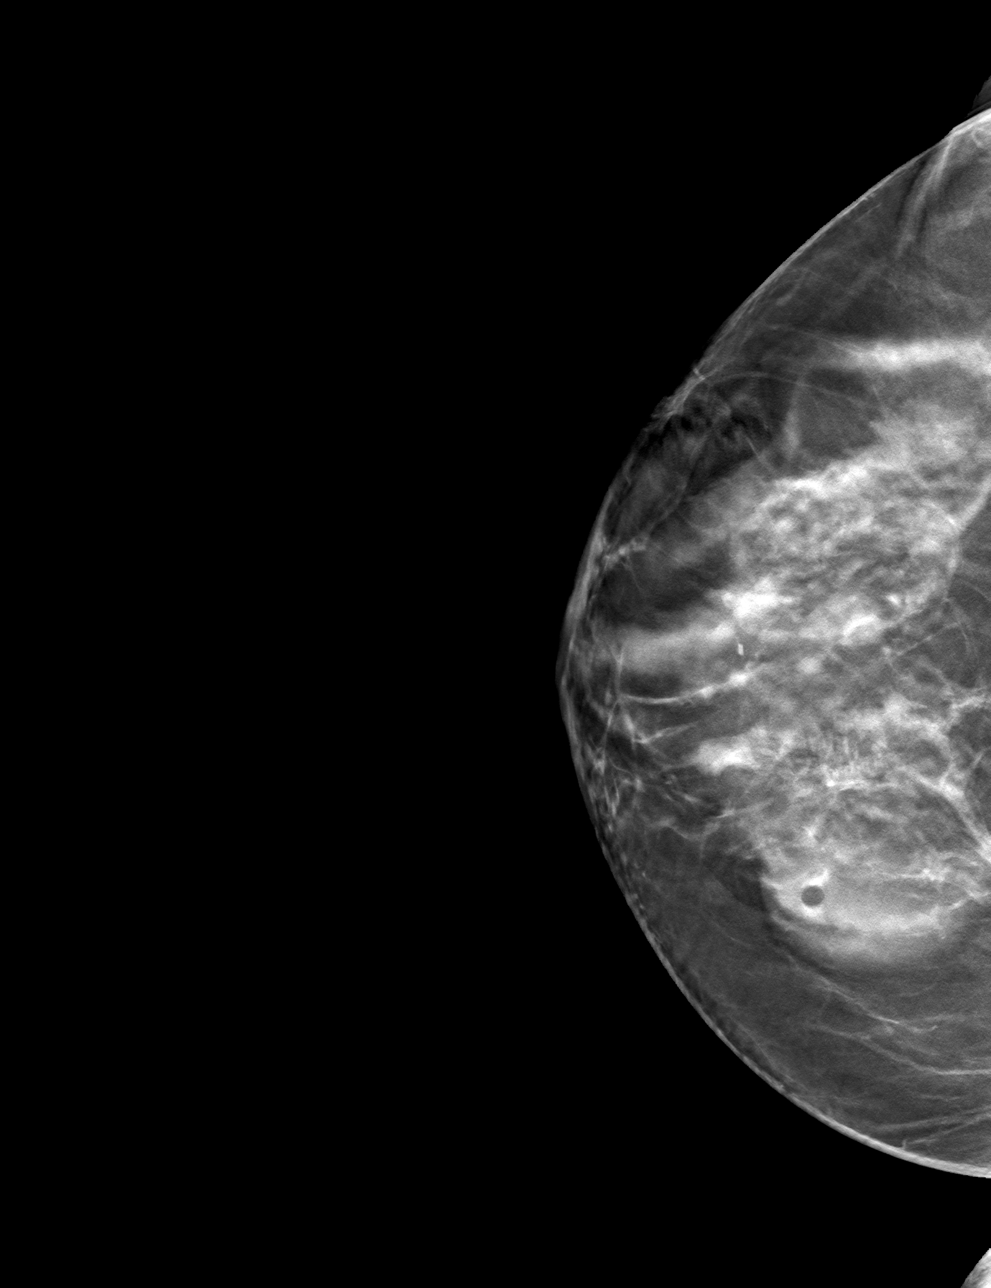

[R ML tomo · tomo slice 39/78.0]
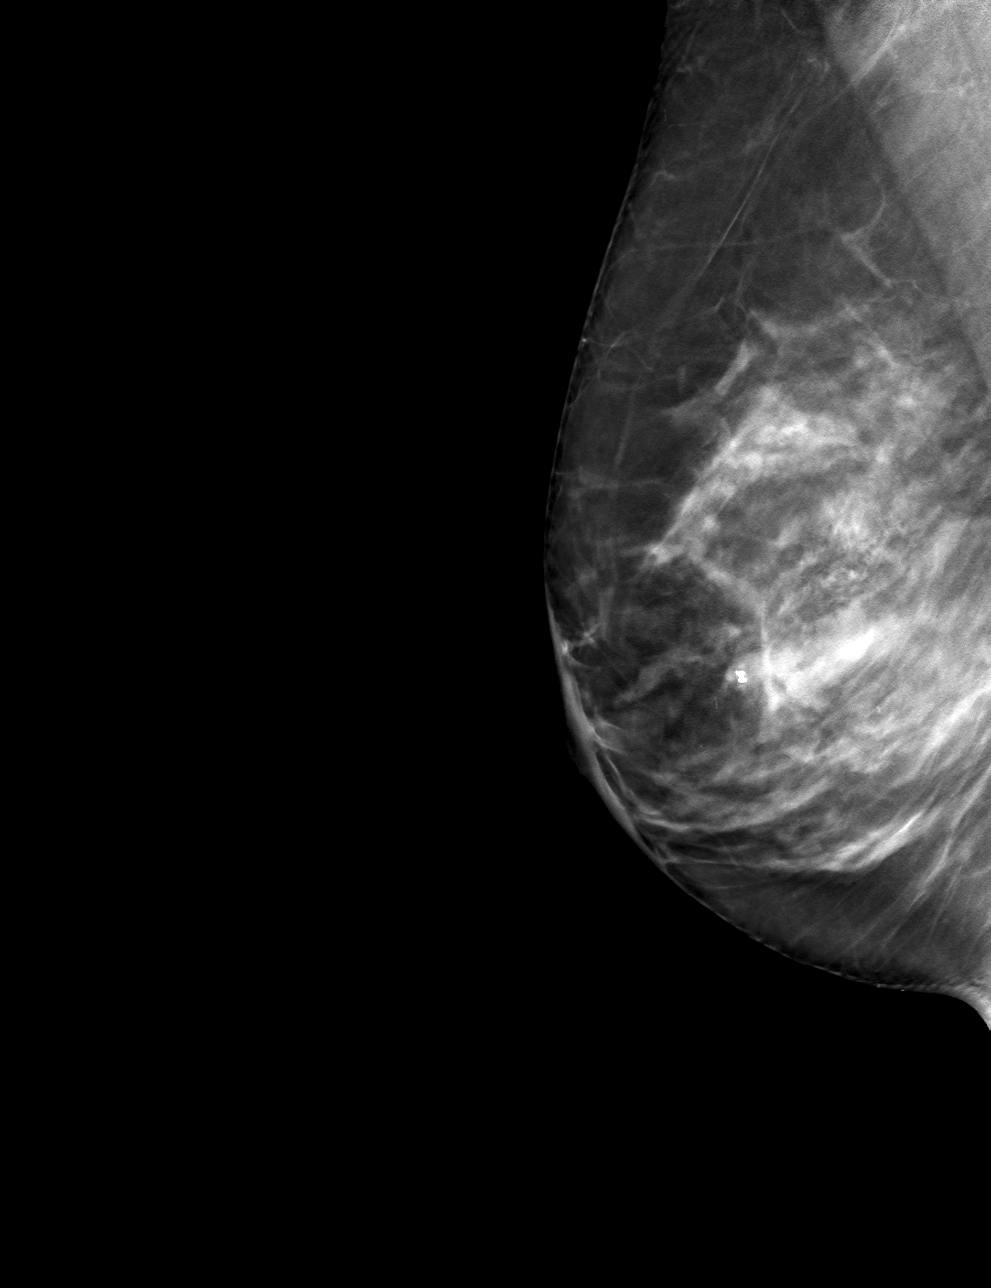

[4 of 12 positions shown; findings below may reference images not displayed]

FINDINGS: Mammographic images were obtained following MRI guided biopsy of
enhancing lesions, 1 in the right and 1 in the left breast. Both
dumbbell shaped biopsy marker clips are in the expected locations in
the anterior breasts, on the left, retroareolar and on the right
slightly towards the upper-outer quadrant.
IMPRESSION: Appropriate positioning of the dumbbell shaped biopsy marking clips
at the site of biopsy in the bilateral breasts as detailed above.

Final Assessment: Post Procedure Mammograms for Marker Placement

## 2020-02-17 IMAGING — MR MR BREAST BX W/ LOC DEV 1ST LEASION IMAGE BX SPEC MR GUIDE*R*
5 of 6 series · 34 of 48 positions shown · IV contrast (gadavist)
Comparison: Previous exams.
COMPARISON: Previous exams.

Addendum:
CLINICAL DATA: Patient presents for MRI guided biopsy of 2 lesions,
1 in each breast, detected on high risk screening breast MRI
performed on [DATE]. The patient has a history of a benign
stereotactic core needle biopsy of the left breast.

EXAM:
MRI GUIDED CORE NEEDLE BIOPSY OF THE LEFT BREAST: LESION #1
MRI GUIDED CORE NEEDLE BIOPSY OF THE RIGHT BREAST: LESION  #2
TECHNIQUE: Multiplanar, multisequence MR imaging of the right and left breasts
was performed both before and after administration of intravenous
contrast.
CONTRAST:  8mL GADAVIST GADOBUTROL 1 MMOL/ML IV SOLN

[Series 2: fiducial bilateral · sagittal · 2.0mm · 1.33mm/px · 8 of 144 slices shown]
[im 1/144]
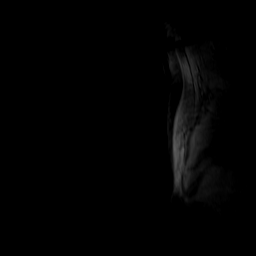
[im 21/144]
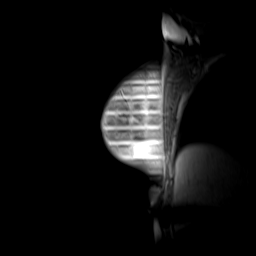
[im 41/144]
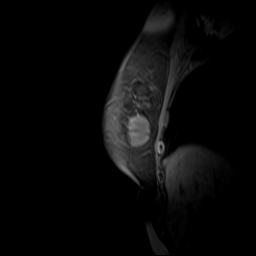
[im 62/144]
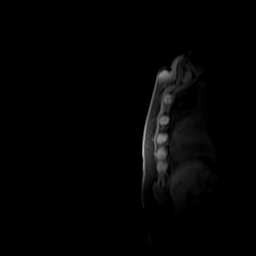
[im 82/144]
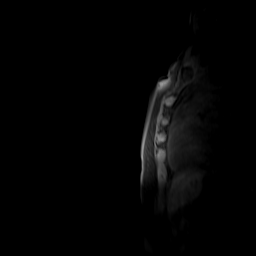
[im 103/144]
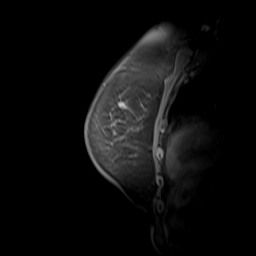
[im 123/144]
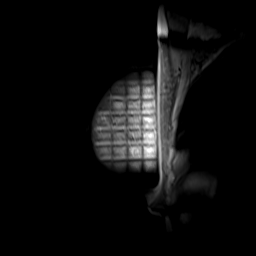
[im 144/144]
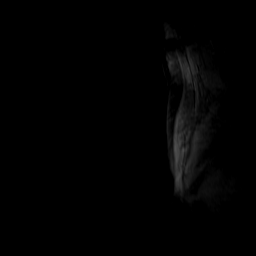

[Series 3: dynamic pre · axial · non-contrast · 1.3mm · 0.73mm/px · z∈[-101,+127]mm · 8 of 176 slices shown]
[im 1/176]
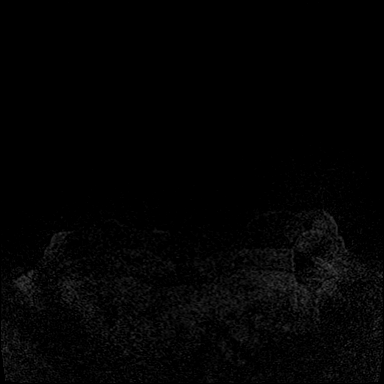
[im 26/176]
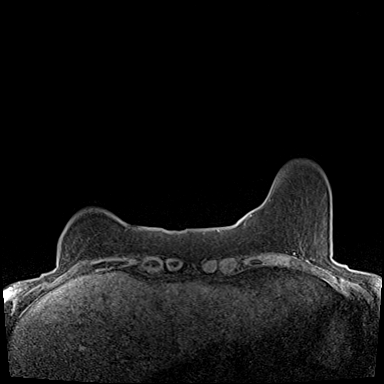
[im 51/176]
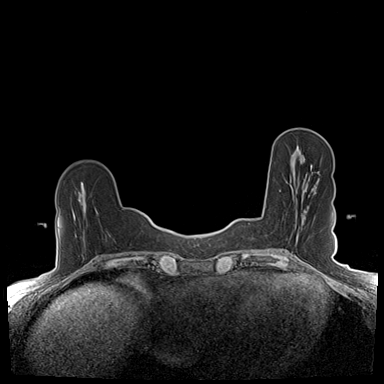
[im 76/176]
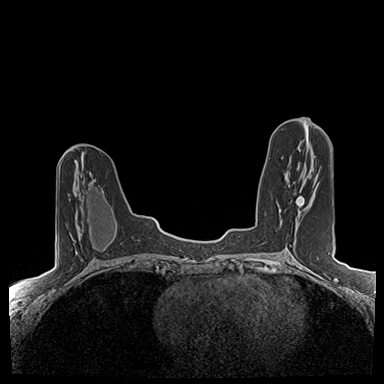
[im 101/176]
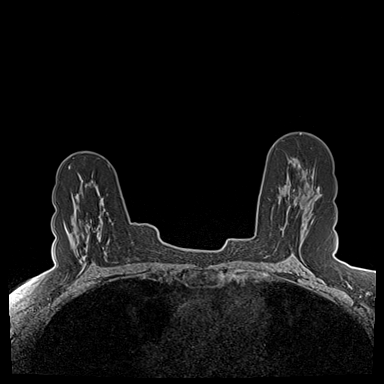
[im 126/176]
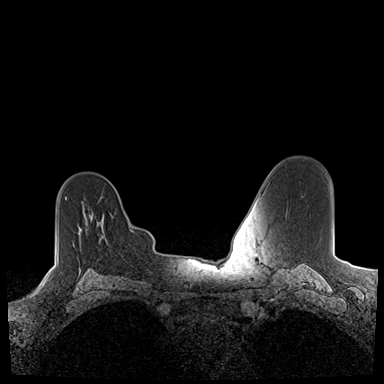
[im 151/176]
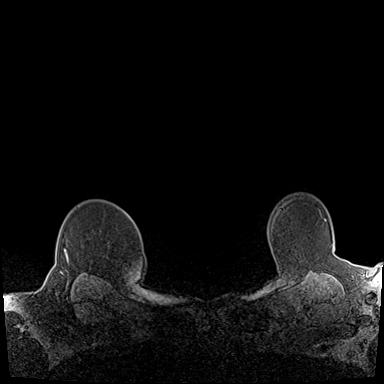
[im 176/176]
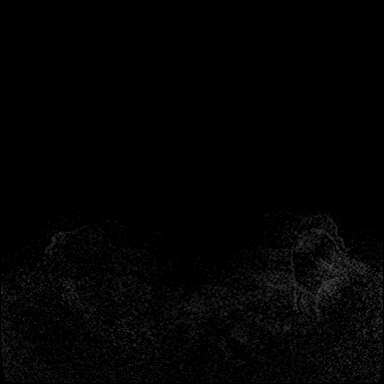

[Series 4: dynamic post 20 · axial · 1.3mm · 0.73mm/px · z∈[-101,+127]mm · 8 of 176 slices shown (1 of 2)]
[im 1/176]
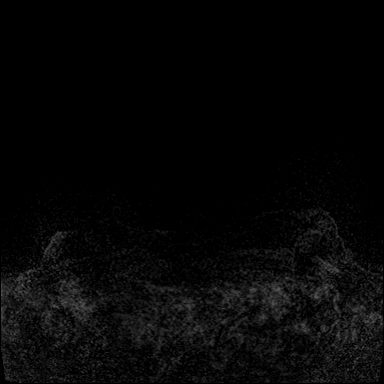
[im 26/176]
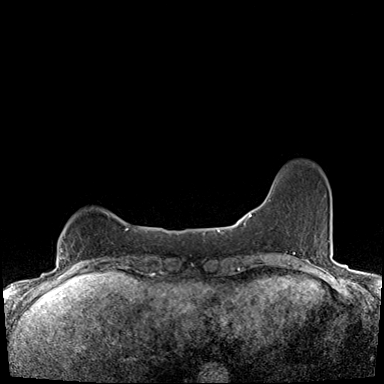
[im 51/176]
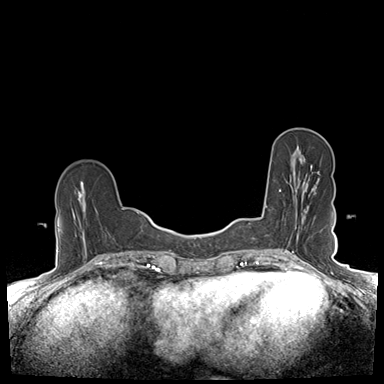
[im 76/176]
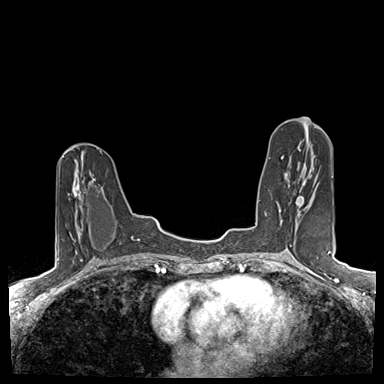
[im 101/176]
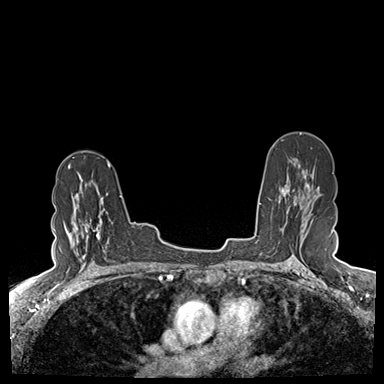
[im 126/176]
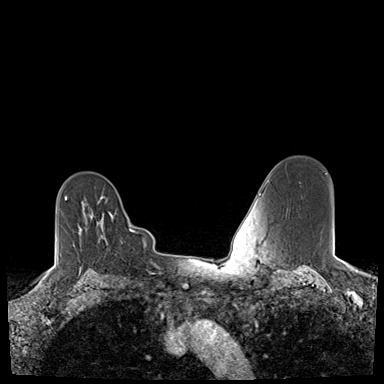
[im 151/176]
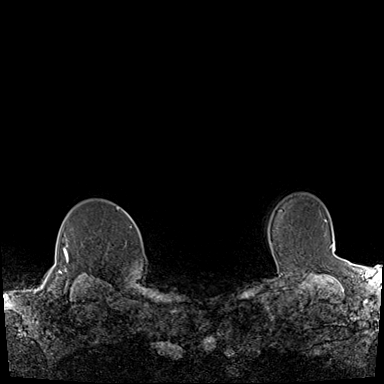
[im 176/176]
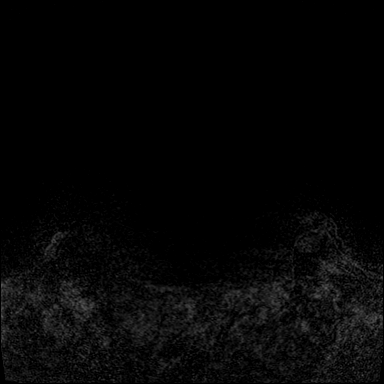

[Series 5: dynamic post 20 · axial · 1.3mm · 0.73mm/px · z∈[-101,+127]mm · 8 of 176 slices shown (2 of 2)]
[im 1/176]
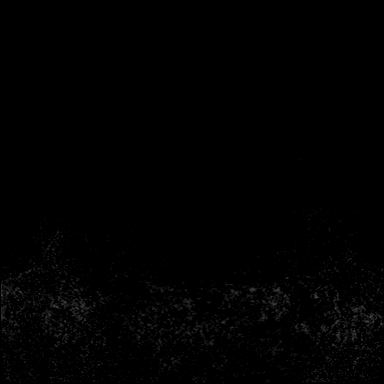
[im 26/176]
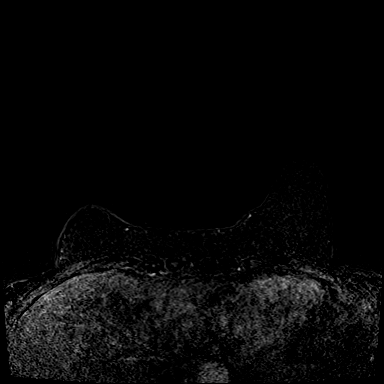
[im 51/176]
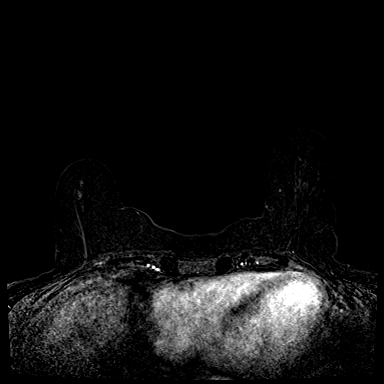
[im 76/176]
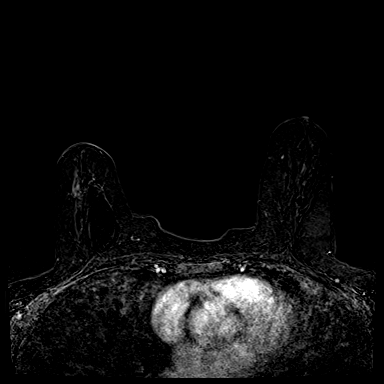
[im 101/176]
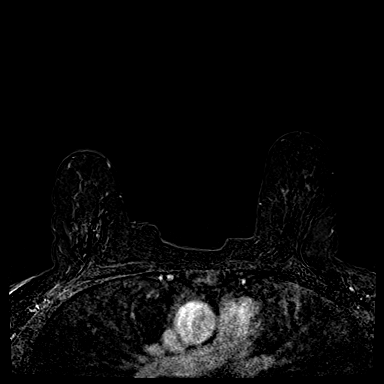
[im 126/176]
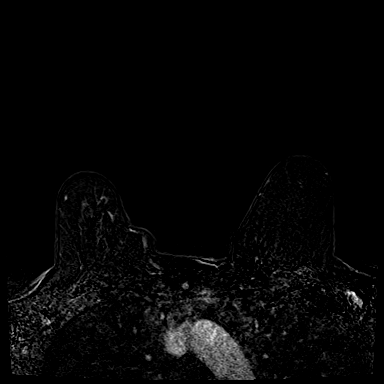
[im 151/176]
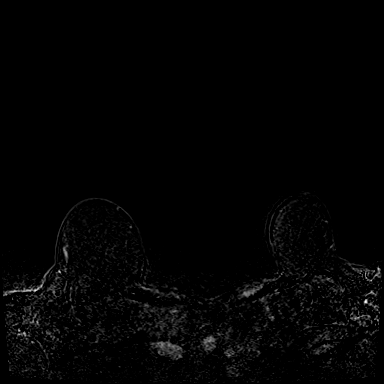
[im 176/176]
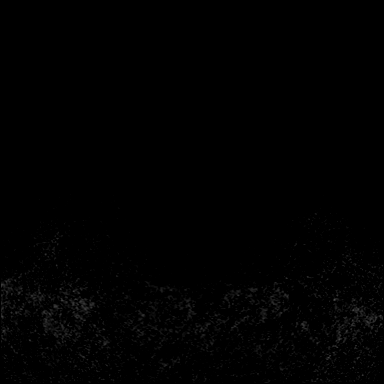

[Series 6: needle confirmation · axial · 1.3mm · 0.73mm/px · z∈[-101,-68]mm · 2 of 176 slices shown]
[im 1/176]
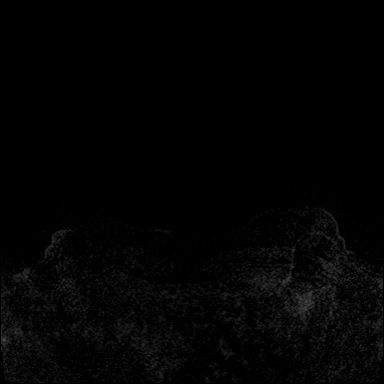
[im 26/176]
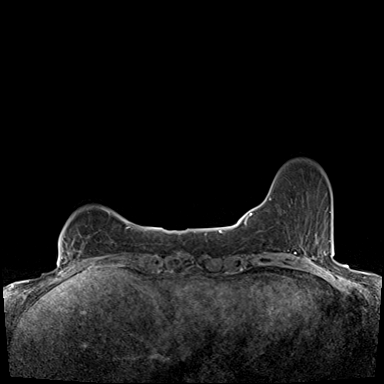

[34 of 48 positions shown; findings below may reference images not displayed]

FINDINGS: I met with the patient, and we discussed the procedure of MRI guided
biopsy, including risks, benefits, and alternatives. Specifically,
we discussed the risks of infection, bleeding, tissue injury, clip
migration, and inadequate sampling. Informed, written consent was
given. The usual time out protocol was performed immediately prior
to the procedure.

Lesion #1: Left breast, upper outer quadrant.

Using sterile technique, 1% Lidocaine, MRI guidance, and a 9 gauge
vacuum assisted device, biopsy was performed of the 1 cm linear
focus of enhancement in the upper outer left breast using a lateral
approach. At the conclusion of the procedure, a barbell tissue
marker clip was deployed into the biopsy cavity.

Lesion #2: Right breast, upper outer quadrant.

Using sterile technique, 1% Lidocaine, MRI guidance, and a 9 gauge
vacuum assisted device, biopsy was performed of the 6 mm focus of
enhancement in the upper outer quadrant using a lateral approach. At
the conclusion of the procedure, a barbell tissue marker clip was
deployed into the biopsy cavity.

Follow-up 2-view mammogram was performed and dictated separately.
IMPRESSION: MRI guided biopsy of 2 lesions, 1 in each breast. No apparent
complications.

ADDENDUM:
Pathology revealed FIBROCYSTIC CHANGE, FIBROADENOMATOID CHANGE,
PSEUDOANGIOMATOUS STROMAL HYPERPLASIA of the LEFT breast, upper
outer quadrant. This was found to be concordant by Dr. EUCLIDES.

Pathology revealed COMPLEX SCLEROSING LESION, PSEUDOANGIOMATOUS
HYPERPLASIA, LOBULAR NEOPLASIA (ATYPICAL LOBULAR NEOPLASIA) of the
RIGHT breast, upper outer quadrant. This was found to be concordant
by Dr. EUCLIDES, with excision recommended.

Pathology results were discussed with the patient by telephone. The
patient reported doing well after the biopsies with tenderness at
the sites. Post biopsy instructions and care were reviewed and
questions were answered. The patient was encouraged to call The

Patient is in the care of Dr. EUCLIDES of [REDACTED] for a LEFT breast biopsy proven complex sclerosing lesion
and should continue with surgical treatment plan.

Pathology results reported by EUCLIDES RN on [DATE].

*** End of Addendum ***
FINDINGS: I met with the patient, and we discussed the procedure of MRI guided
biopsy, including risks, benefits, and alternatives. Specifically,
we discussed the risks of infection, bleeding, tissue injury, clip
migration, and inadequate sampling. Informed, written consent was
given. The usual time out protocol was performed immediately prior
to the procedure.

Lesion #1: Left breast, upper outer quadrant.

Using sterile technique, 1% Lidocaine, MRI guidance, and a 9 gauge
vacuum assisted device, biopsy was performed of the 1 cm linear
focus of enhancement in the upper outer left breast using a lateral
approach. At the conclusion of the procedure, a barbell tissue
marker clip was deployed into the biopsy cavity.

Lesion #2: Right breast, upper outer quadrant.

Using sterile technique, 1% Lidocaine, MRI guidance, and a 9 gauge
vacuum assisted device, biopsy was performed of the 6 mm focus of
enhancement in the upper outer quadrant using a lateral approach. At
the conclusion of the procedure, a barbell tissue marker clip was
deployed into the biopsy cavity.

Follow-up 2-view mammogram was performed and dictated separately.
IMPRESSION: MRI guided biopsy of 2 lesions, 1 in each breast. No apparent
complications.

## 2020-02-17 IMAGING — MG MM BREAST LOCALIZATION CLIP
4 series · 4 of 12 positions shown · non-contrast
Comparison: Previous exam(s).

CLINICAL DATA: Evaluate post biopsy marker clip placement following
MRI guided biopsy of enhancing lesions, 1 in each breast.

EXAM:
DIAGNOSTIC BILATERAL MAMMOGRAM POST MRI BIOPSY

[L ML synth-2D]
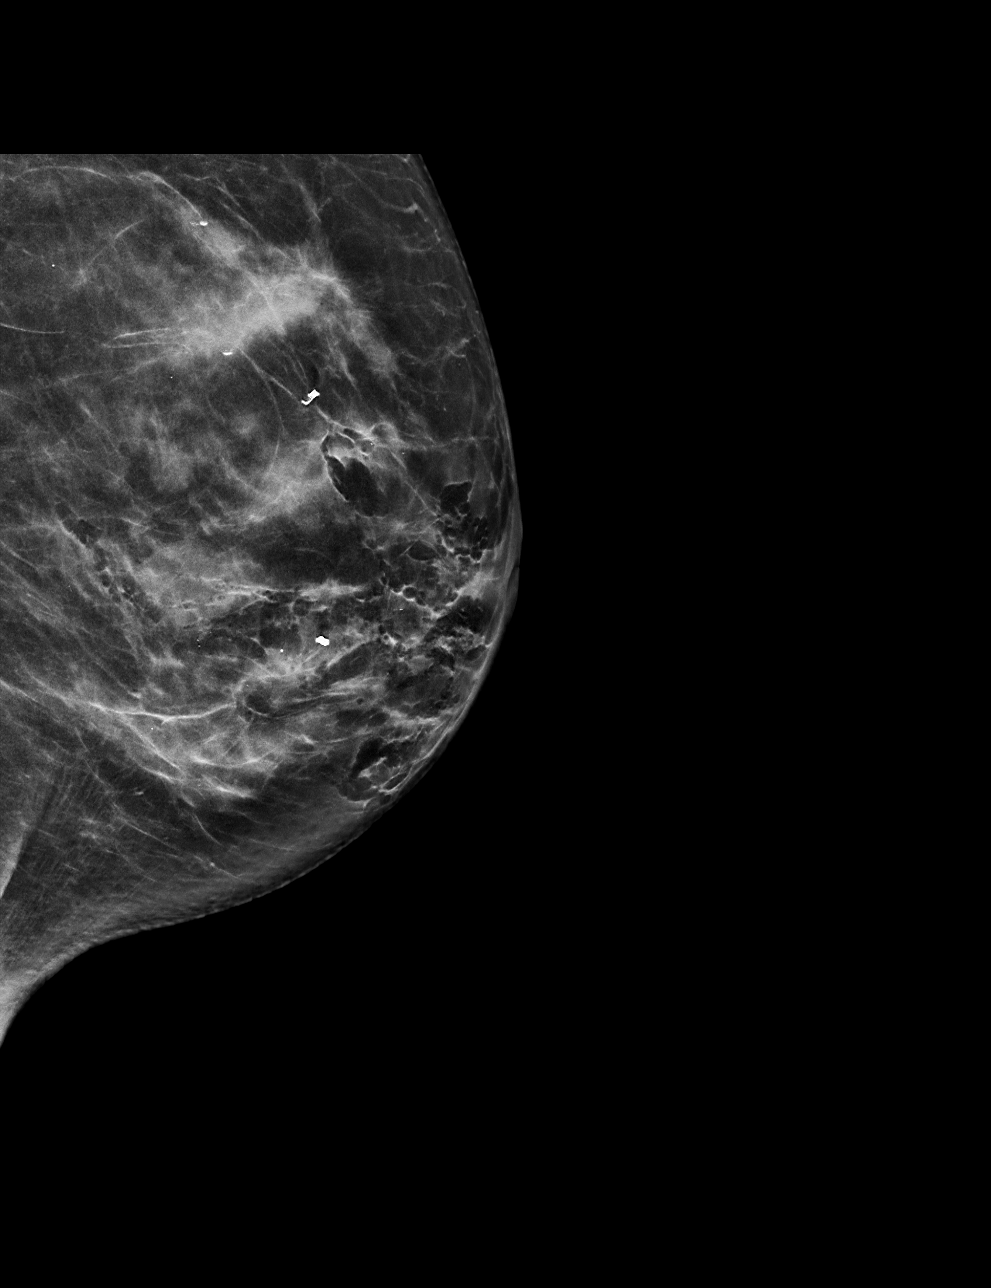

[L CC synth-2D]
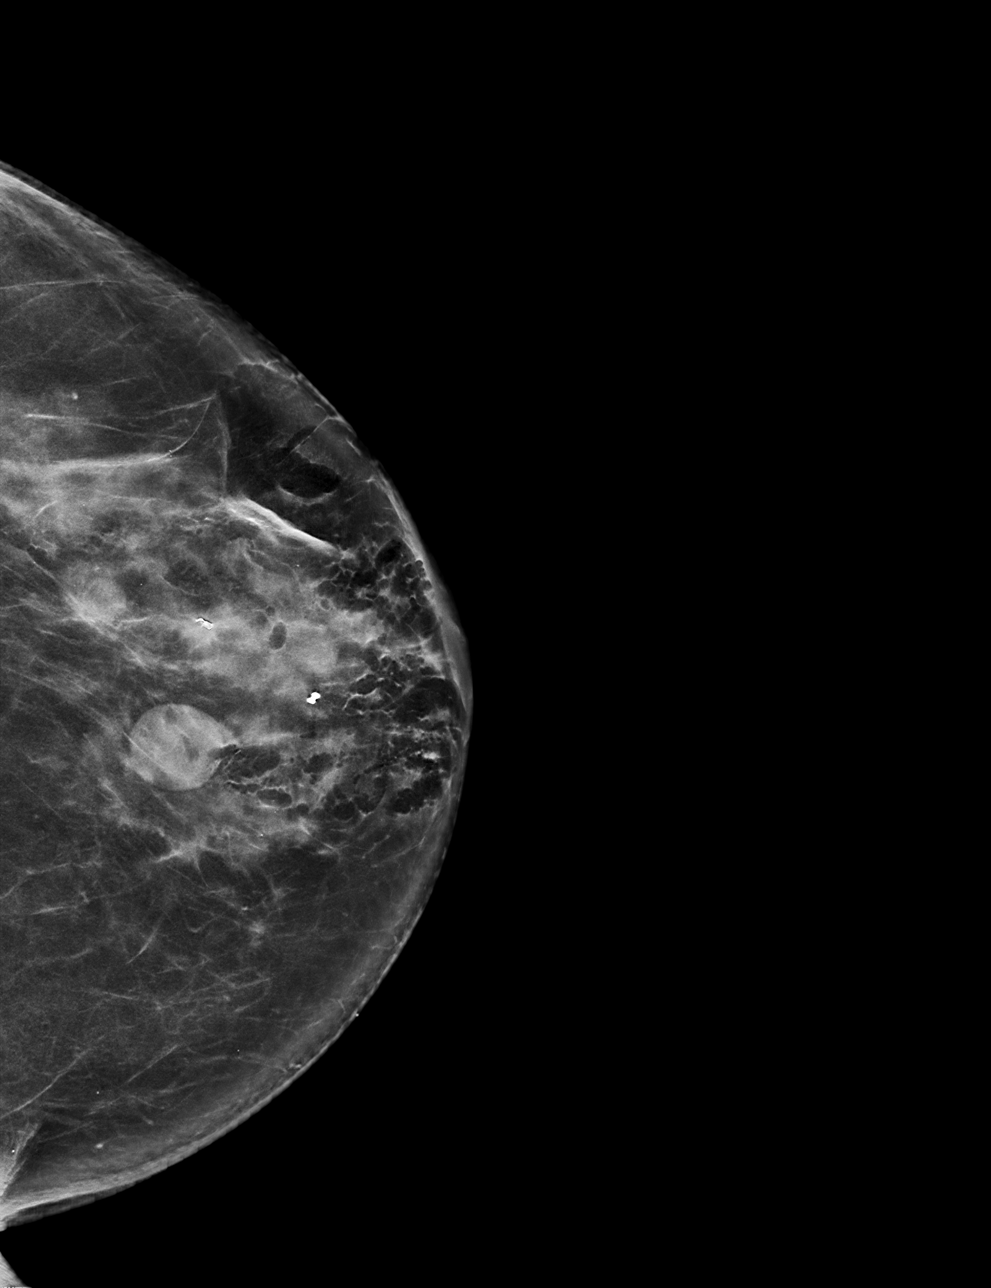

[L CC tomo · tomo slice 39/77.0]
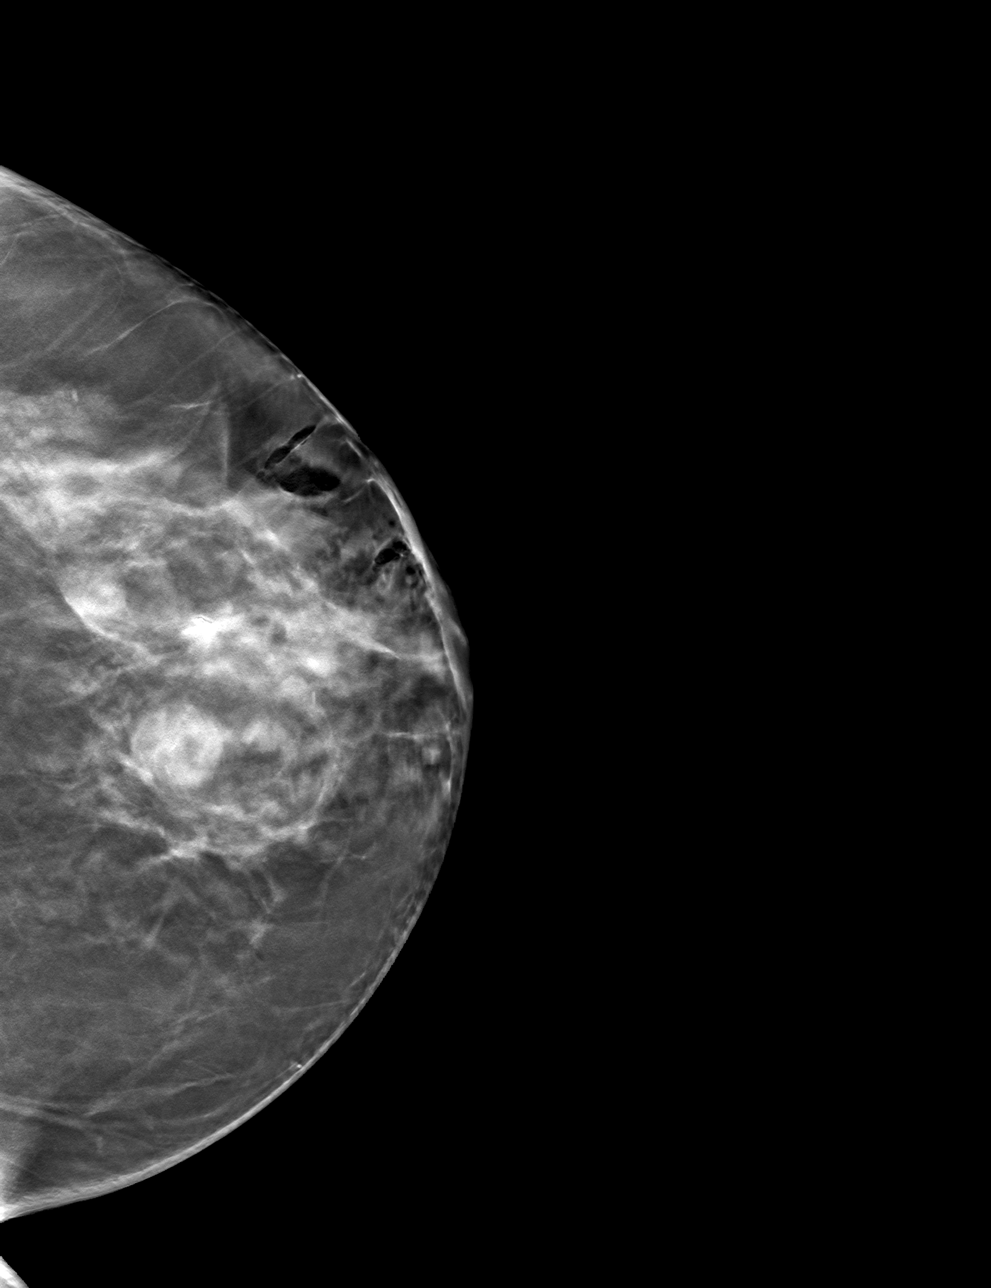

[L ML tomo · tomo slice 36/71.0]
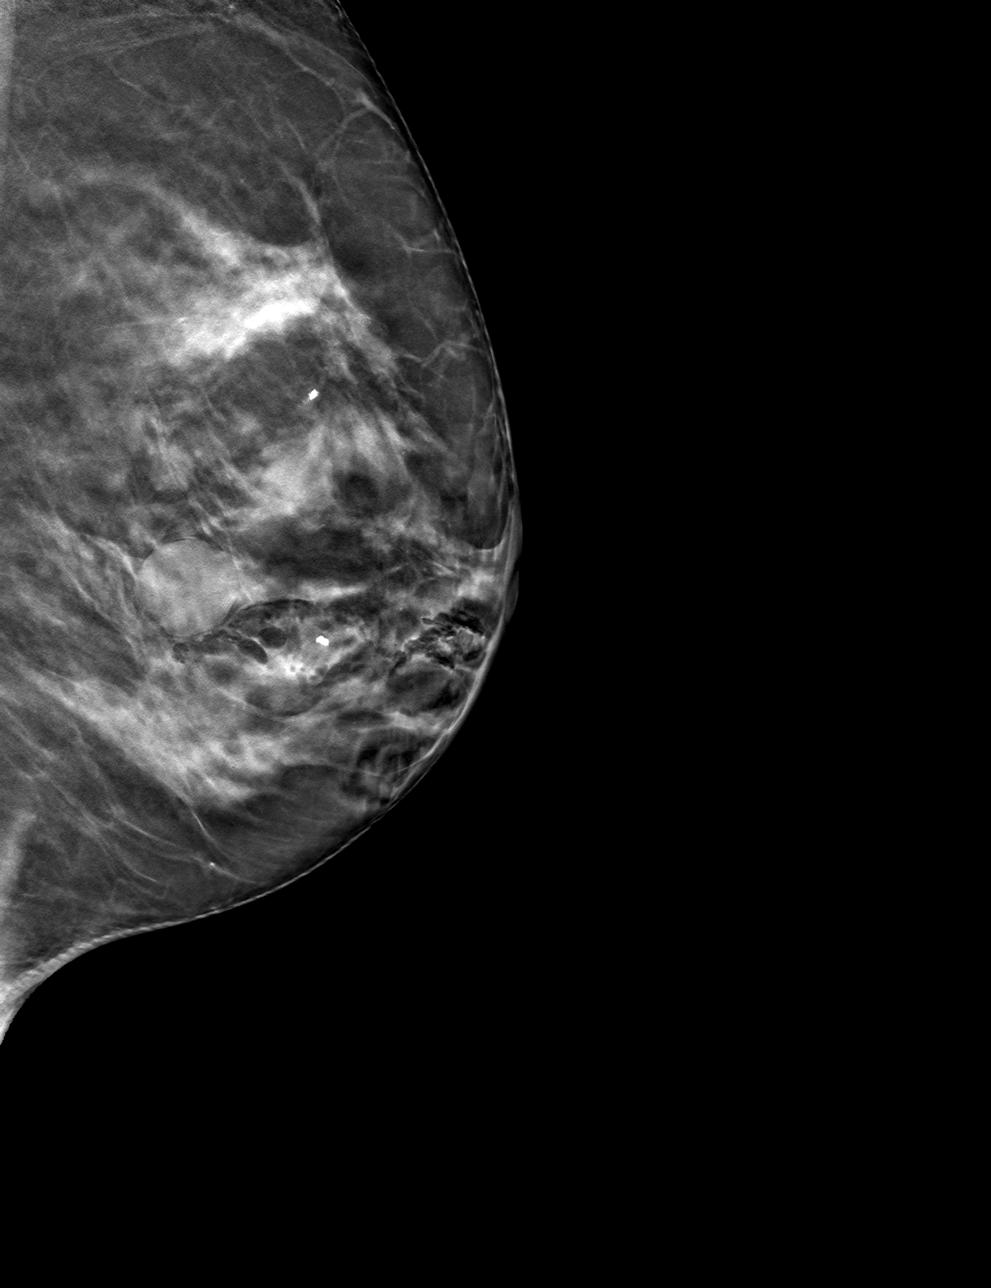

[4 of 12 positions shown; findings below may reference images not displayed]

FINDINGS: Mammographic images were obtained following MRI guided biopsy of
enhancing lesions, 1 in the right and 1 in the left breast. Both
dumbbell shaped biopsy marker clips are in the expected locations in
the anterior breasts, on the left, retroareolar and on the right
slightly towards the upper-outer quadrant.
IMPRESSION: Appropriate positioning of the dumbbell shaped biopsy marking clips
at the site of biopsy in the bilateral breasts as detailed above.

Final Assessment: Post Procedure Mammograms for Marker Placement

## 2020-02-17 IMAGING — MR MR BREAST BX W LOC DEV 1ST LESION IMAGE BX SPEC MR GUIDE*L*
5 of 6 series · 34 of 48 positions shown · IV contrast (8ml Gadavist)
Comparison: Previous exams.
COMPARISON: Previous exams.

Addendum:
CLINICAL DATA: Patient presents for MRI guided biopsy of 2 lesions,
1 in each breast, detected on high risk screening breast MRI
performed on [DATE]. The patient has a history of a benign
stereotactic core needle biopsy of the left breast.

EXAM:
MRI GUIDED CORE NEEDLE BIOPSY OF THE LEFT BREAST: LESION #1
MRI GUIDED CORE NEEDLE BIOPSY OF THE RIGHT BREAST: LESION  #2
TECHNIQUE: Multiplanar, multisequence MR imaging of the right and left breasts
was performed both before and after administration of intravenous
contrast.
CONTRAST:  8mL GADAVIST GADOBUTROL 1 MMOL/ML IV SOLN

[Series 2: fiducial bilateral · sagittal · 2.0mm · 1.33mm/px · 8 of 144 slices shown]
[im 1/144]
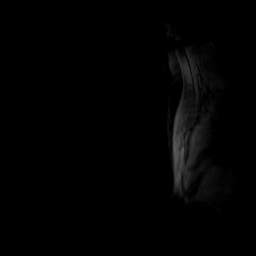
[im 21/144]
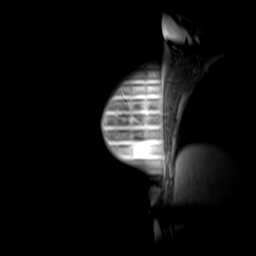
[im 41/144]
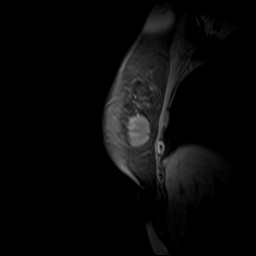
[im 62/144]
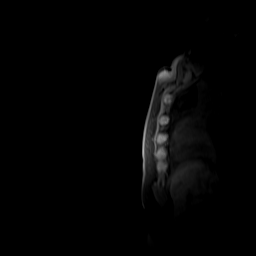
[im 82/144]
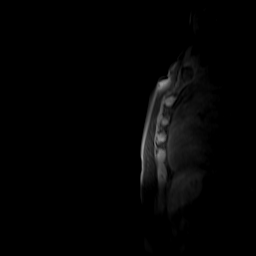
[im 103/144]
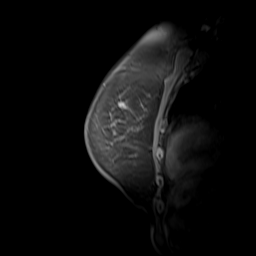
[im 123/144]
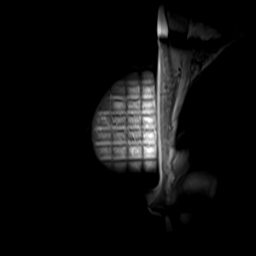
[im 144/144]
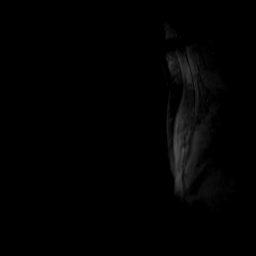

[Series 3: dynamic pre · axial · non-contrast · 1.3mm · 0.73mm/px · z∈[-101,+127]mm · 8 of 176 slices shown]
[im 1/176]
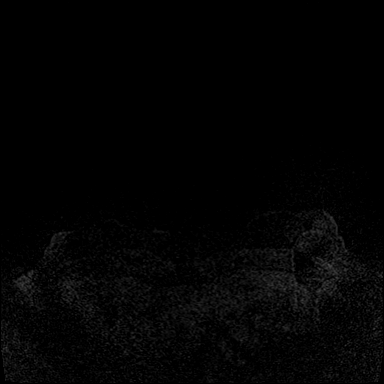
[im 26/176]
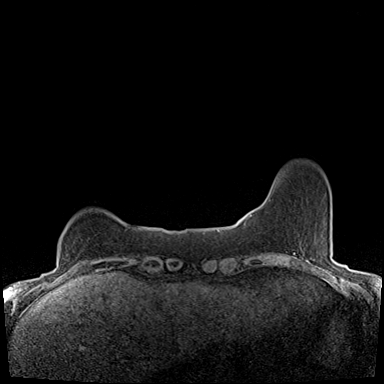
[im 51/176]
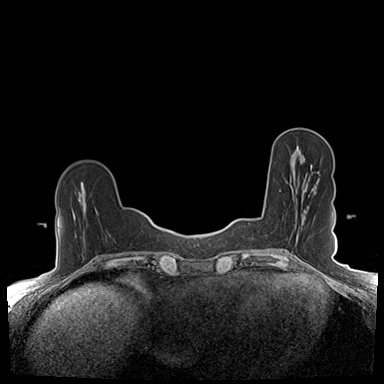
[im 76/176]
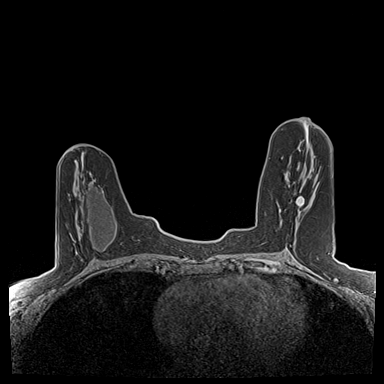
[im 101/176]
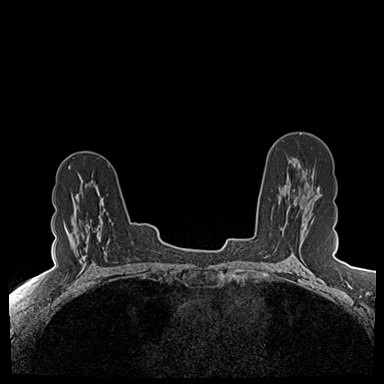
[im 126/176]
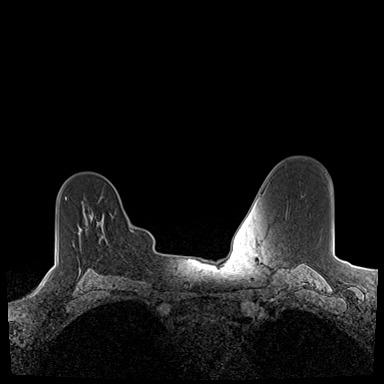
[im 151/176]
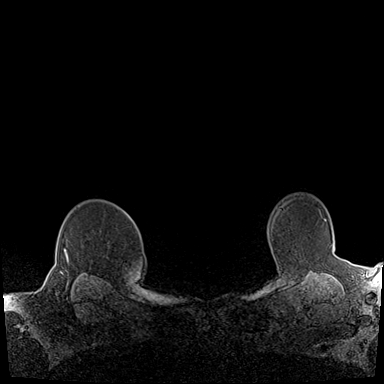
[im 176/176]
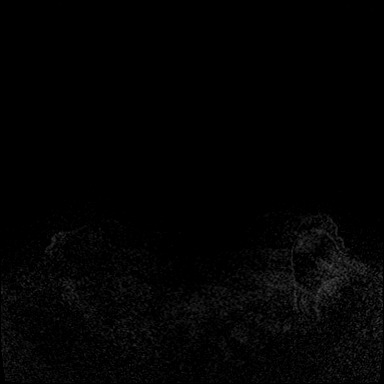

[Series 4: dynamic post 20 · axial · 1.3mm · 0.73mm/px · z∈[-101,+127]mm · 8 of 176 slices shown (1 of 2)]
[im 1/176]
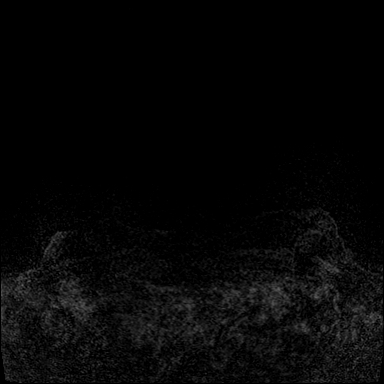
[im 26/176]
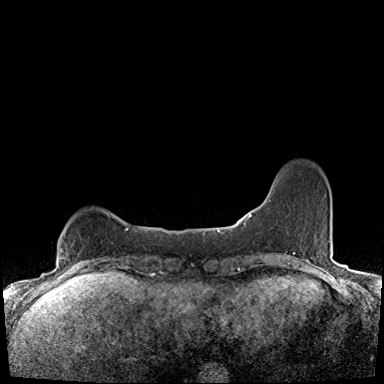
[im 51/176]
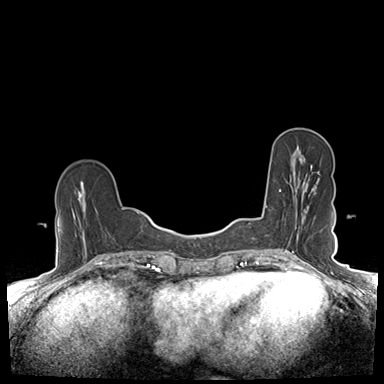
[im 76/176]
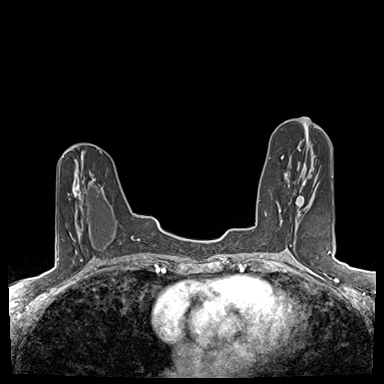
[im 101/176]
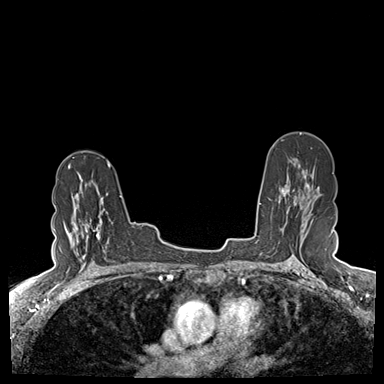
[im 126/176]
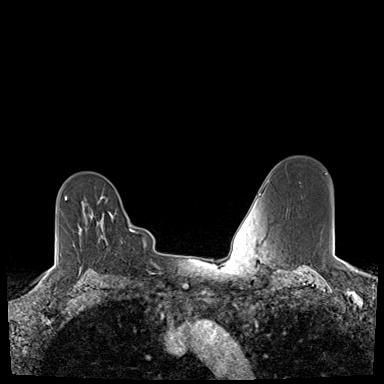
[im 151/176]
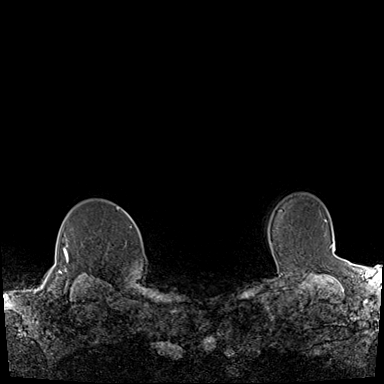
[im 176/176]
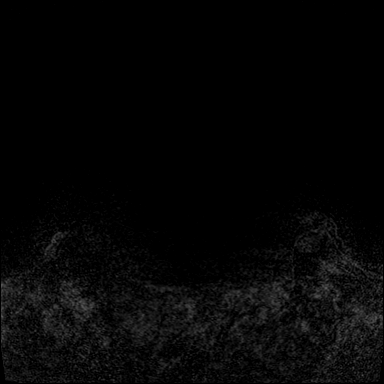

[Series 5: dynamic post 20 · axial · 1.3mm · 0.73mm/px · z∈[-101,+127]mm · 8 of 176 slices shown (2 of 2)]
[im 1/176]
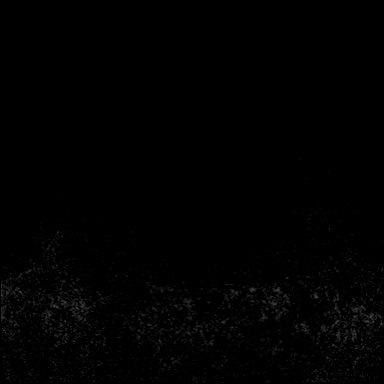
[im 26/176]
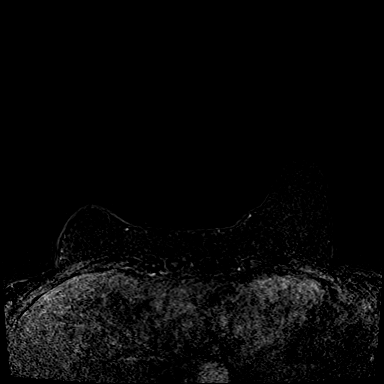
[im 51/176]
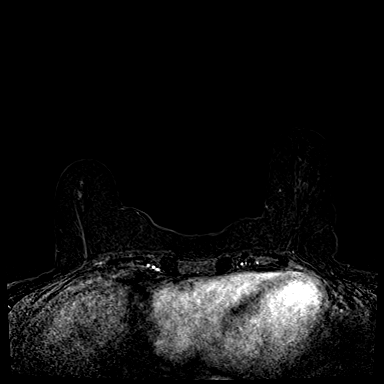
[im 76/176]
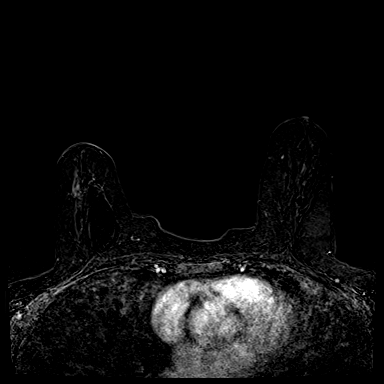
[im 101/176]
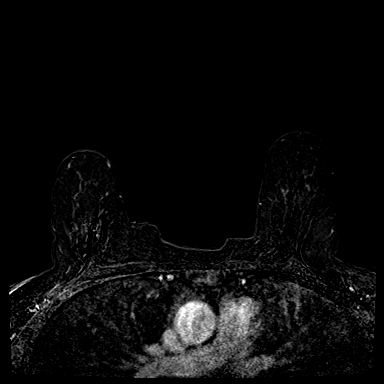
[im 126/176]
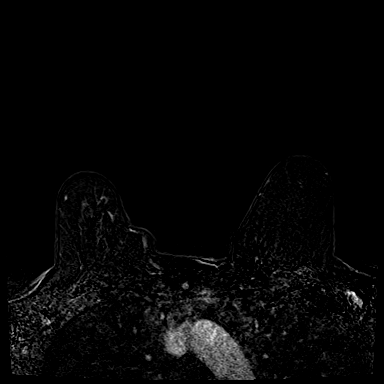
[im 151/176]
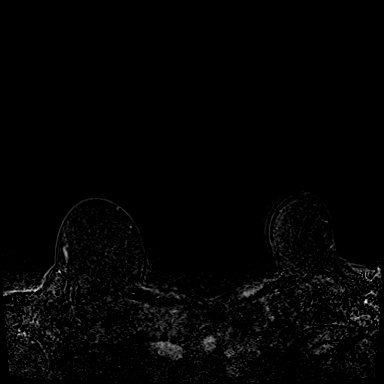
[im 176/176]
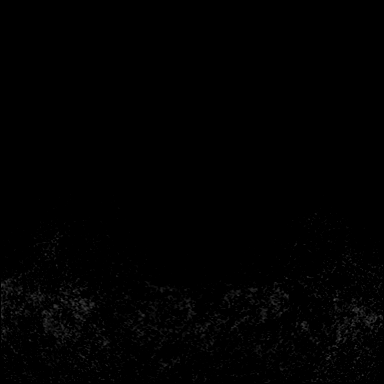

[Series 6: needle confirmation · axial · 1.3mm · 0.73mm/px · z∈[-101,-68]mm · 2 of 176 slices shown]
[im 1/176]
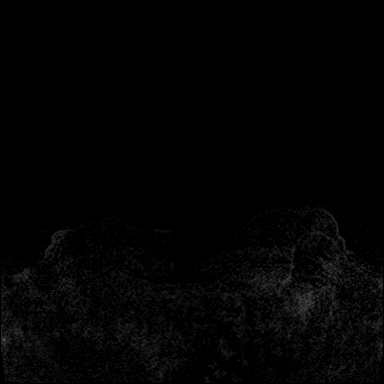
[im 26/176]
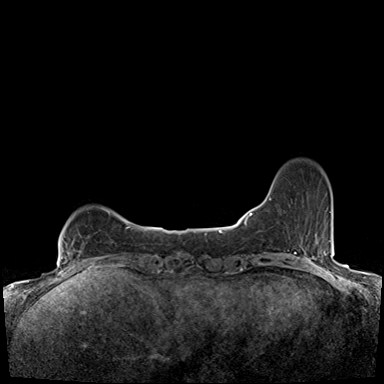

[34 of 48 positions shown; findings below may reference images not displayed]

FINDINGS: I met with the patient, and we discussed the procedure of MRI guided
biopsy, including risks, benefits, and alternatives. Specifically,
we discussed the risks of infection, bleeding, tissue injury, clip
migration, and inadequate sampling. Informed, written consent was
given. The usual time out protocol was performed immediately prior
to the procedure.

Lesion #1: Left breast, upper outer quadrant.

Using sterile technique, 1% Lidocaine, MRI guidance, and a 9 gauge
vacuum assisted device, biopsy was performed of the 1 cm linear
focus of enhancement in the upper outer left breast using a lateral
approach. At the conclusion of the procedure, a barbell tissue
marker clip was deployed into the biopsy cavity.

Lesion #2: Right breast, upper outer quadrant.

Using sterile technique, 1% Lidocaine, MRI guidance, and a 9 gauge
vacuum assisted device, biopsy was performed of the 6 mm focus of
enhancement in the upper outer quadrant using a lateral approach. At
the conclusion of the procedure, a barbell tissue marker clip was
deployed into the biopsy cavity.

Follow-up 2-view mammogram was performed and dictated separately.
IMPRESSION: MRI guided biopsy of 2 lesions, 1 in each breast. No apparent
complications.

ADDENDUM:
Pathology revealed FIBROCYSTIC CHANGE, FIBROADENOMATOID CHANGE,
PSEUDOANGIOMATOUS STROMAL HYPERPLASIA of the LEFT breast, upper
outer quadrant. This was found to be concordant by Dr. EUCLIDES.

Pathology revealed COMPLEX SCLEROSING LESION, PSEUDOANGIOMATOUS
HYPERPLASIA, LOBULAR NEOPLASIA (ATYPICAL LOBULAR NEOPLASIA) of the
RIGHT breast, upper outer quadrant. This was found to be concordant
by Dr. EUCLIDES, with excision recommended.

Pathology results were discussed with the patient by telephone. The
patient reported doing well after the biopsies with tenderness at
the sites. Post biopsy instructions and care were reviewed and
questions were answered. The patient was encouraged to call The

Patient is in the care of Dr. EUCLIDES of [REDACTED] for a LEFT breast biopsy proven complex sclerosing lesion
and should continue with surgical treatment plan.

Pathology results reported by EUCLIDES RN on [DATE].

*** End of Addendum ***
FINDINGS: I met with the patient, and we discussed the procedure of MRI guided
biopsy, including risks, benefits, and alternatives. Specifically,
we discussed the risks of infection, bleeding, tissue injury, clip
migration, and inadequate sampling. Informed, written consent was
given. The usual time out protocol was performed immediately prior
to the procedure.

Lesion #1: Left breast, upper outer quadrant.

Using sterile technique, 1% Lidocaine, MRI guidance, and a 9 gauge
vacuum assisted device, biopsy was performed of the 1 cm linear
focus of enhancement in the upper outer left breast using a lateral
approach. At the conclusion of the procedure, a barbell tissue
marker clip was deployed into the biopsy cavity.

Lesion #2: Right breast, upper outer quadrant.

Using sterile technique, 1% Lidocaine, MRI guidance, and a 9 gauge
vacuum assisted device, biopsy was performed of the 6 mm focus of
enhancement in the upper outer quadrant using a lateral approach. At
the conclusion of the procedure, a barbell tissue marker clip was
deployed into the biopsy cavity.

Follow-up 2-view mammogram was performed and dictated separately.
IMPRESSION: MRI guided biopsy of 2 lesions, 1 in each breast. No apparent
complications.

## 2020-02-17 MED ORDER — GADOBUTROL 1 MMOL/ML IV SOLN
8.0000 mL | Freq: Once | INTRAVENOUS | Status: AC | PRN
  Administered 2020-02-17: 8 mL via INTRAVENOUS

## 2020-02-18 ENCOUNTER — Ambulatory Visit: Payer: Self-pay | Admitting: Surgery

## 2020-02-18 DIAGNOSIS — N6320 Unspecified lump in the left breast, unspecified quadrant: Secondary | ICD-10-CM

## 2020-02-18 DIAGNOSIS — N6310 Unspecified lump in the right breast, unspecified quadrant: Secondary | ICD-10-CM

## 2020-02-23 ENCOUNTER — Other Ambulatory Visit: Payer: Self-pay | Admitting: Surgery

## 2020-02-23 DIAGNOSIS — N6310 Unspecified lump in the right breast, unspecified quadrant: Secondary | ICD-10-CM

## 2020-02-23 DIAGNOSIS — N6320 Unspecified lump in the left breast, unspecified quadrant: Secondary | ICD-10-CM

## 2020-03-14 ENCOUNTER — Other Ambulatory Visit: Payer: Self-pay

## 2020-03-14 ENCOUNTER — Encounter (HOSPITAL_BASED_OUTPATIENT_CLINIC_OR_DEPARTMENT_OTHER): Payer: Self-pay | Admitting: Surgery

## 2020-03-18 ENCOUNTER — Other Ambulatory Visit (HOSPITAL_COMMUNITY)
Admission: RE | Admit: 2020-03-18 | Discharge: 2020-03-18 | Disposition: A | Discharge status: Home / Self Care | Payer: BC Managed Care – PPO | Source: Ambulatory Visit | Attending: Surgery | Admitting: Surgery

## 2020-03-18 DIAGNOSIS — Z20822 Contact with and (suspected) exposure to covid-19: Secondary | ICD-10-CM | POA: Diagnosis not present

## 2020-03-18 DIAGNOSIS — Z01812 Encounter for preprocedural laboratory examination: Secondary | ICD-10-CM | POA: Insufficient documentation

## 2020-03-18 LAB — SARS CORONAVIRUS 2 (TAT 6-24 HRS): SARS Coronavirus 2: NEGATIVE

## 2020-03-21 ENCOUNTER — Other Ambulatory Visit: Payer: Self-pay

## 2020-03-21 ENCOUNTER — Ambulatory Visit
Admission: RE | Admit: 2020-03-21 | Discharge: 2020-03-21 | Disposition: A | Discharge status: Home / Self Care | Payer: BC Managed Care – PPO | Source: Ambulatory Visit | Attending: Surgery | Admitting: Surgery

## 2020-03-21 DIAGNOSIS — N6310 Unspecified lump in the right breast, unspecified quadrant: Secondary | ICD-10-CM

## 2020-03-21 DIAGNOSIS — N6320 Unspecified lump in the left breast, unspecified quadrant: Secondary | ICD-10-CM

## 2020-03-21 IMAGING — MG MM PLC BREAST LOC DEV 1ST LESION INC*R*
5 series · 5 of 5 positions shown · non-contrast
Comparison: Previous exam(s).

CLINICAL DATA: Patient presents for radioactive seed localization
of complex sclerosing lesions, 1 in each breast.

EXAM:
MAMMOGRAPHIC GUIDED RADIOACTIVE SEED LOCALIZATION OF THE BILATERAL
BREAST: 2 SEEDS PLACED

[R CC (1 of 3)]
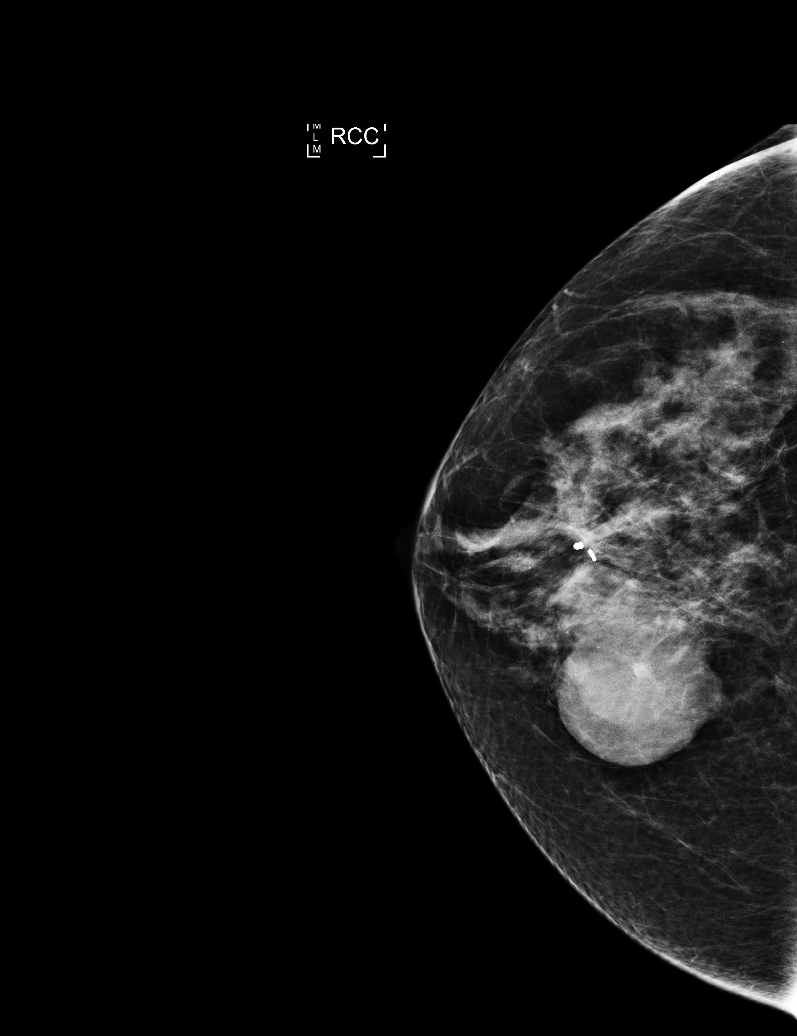

[R CC (2 of 3)]
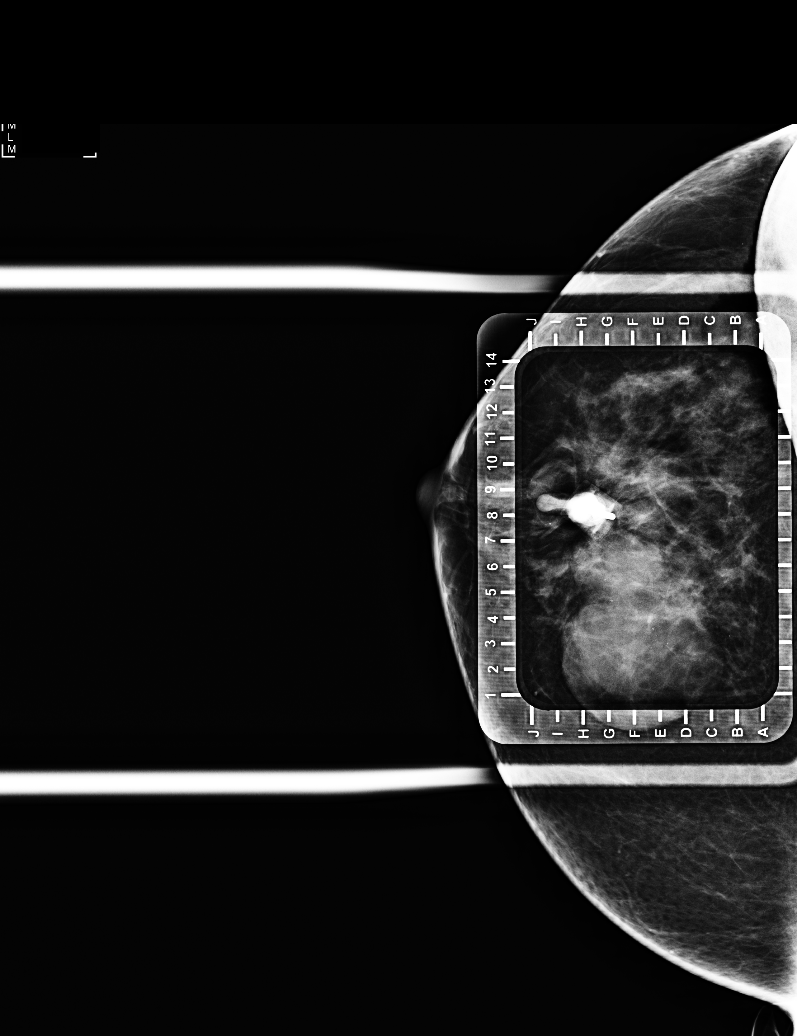

[R ML (1 of 2)]
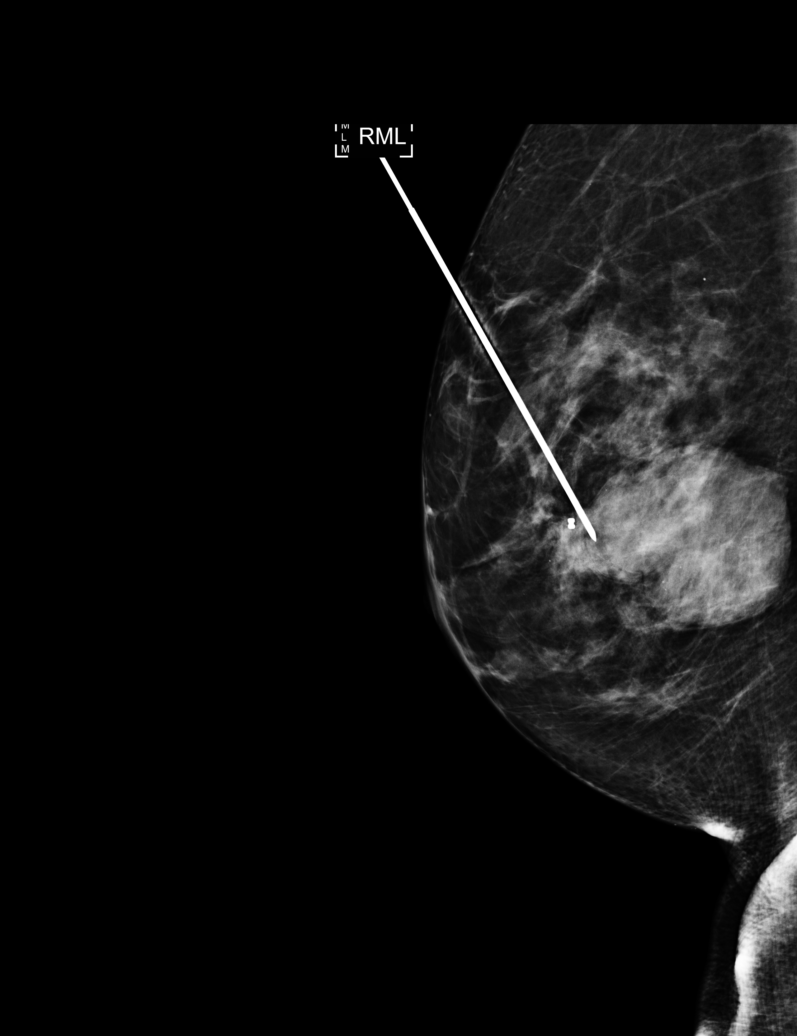

[R ML (2 of 2)]
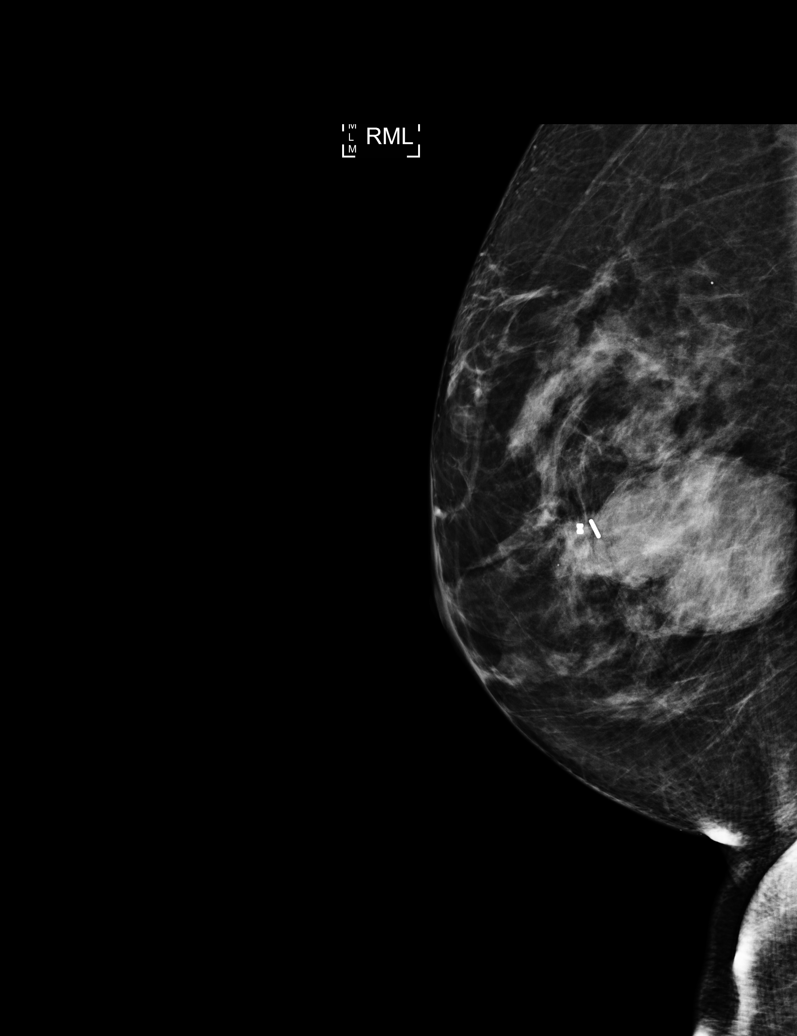

[R CC (3 of 3)]
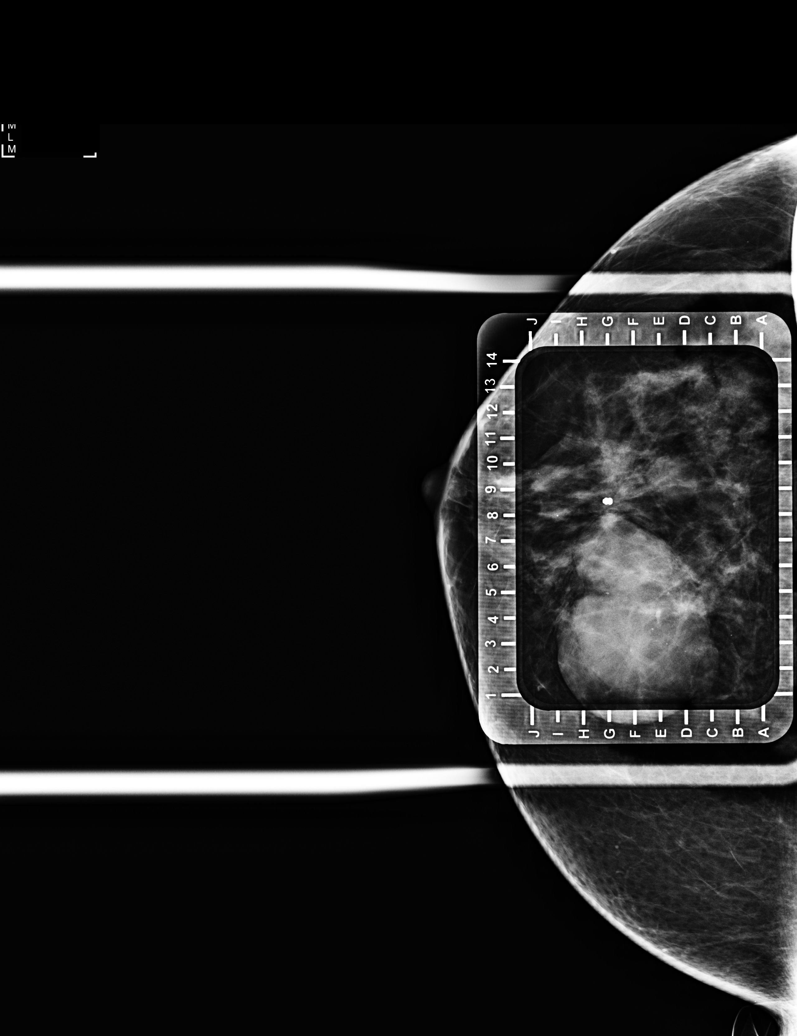

[5 of 5 positions shown; findings below may reference images not displayed]

FINDINGS: Patient presents for radioactive seed localization prior to surgical
excision. I met with the patient and we discussed the procedure of
seed localization including benefits and alternatives. We discussed
the high likelihood of a successful procedure. We discussed the
risks of the procedure including infection, bleeding, tissue injury
and further surgery. We discussed the low dose of radioactivity
involved in the procedure. Informed, written consent was given.

The usual time-out protocol was performed immediately prior to the
procedure.

Right breast

Using mammographic guidance, sterile technique, 1% lidocaine and an
[PS] radioactive seed, the dumbbell shaped biopsy clip was
localized using a superior approach. The follow-up mammogram images
confirm the seed in the expected location and were marked for Dr.
FAIZULLA.

Follow-up survey of the patient confirms presence of the radioactive
seed.

Order number of [PS] seed:  [PHONE_NUMBER].

Total activity:  0.233 millicuries reference Date: [DATE]

Left breast

Using mammographic guidance, sterile technique, 1% lidocaine and an
[PS] radioactive seed, the coil shaped biopsy clip was localized
using a superior approach. The follow-up mammogram images confirm
the seed in the expected location and were marked for Dr. FAIZULLA.

Follow-up survey of the patient confirms presence of the radioactive
seed.

Order number of [PS] seed:  [PHONE_NUMBER].

Total activity:  0.252 millicuries reference Date: [DATE]

The patient tolerated the procedure well and was released from the
[REDACTED]. She was given instructions regarding seed removal.
IMPRESSION: Radioactive seed localization bilateral breast. No apparent
complications.

## 2020-03-21 IMAGING — MG MM PLC BREAST LOC DEV 1ST LESION INC MAMMO GUIDE*L*
5 series · 5 of 5 positions shown · non-contrast
Comparison: Previous exam(s).

CLINICAL DATA: Patient presents for radioactive seed localization
of complex sclerosing lesions, 1 in each breast.

EXAM:
MAMMOGRAPHIC GUIDED RADIOACTIVE SEED LOCALIZATION OF THE BILATERAL
BREAST: 2 SEEDS PLACED

[L CC (1 of 3)]
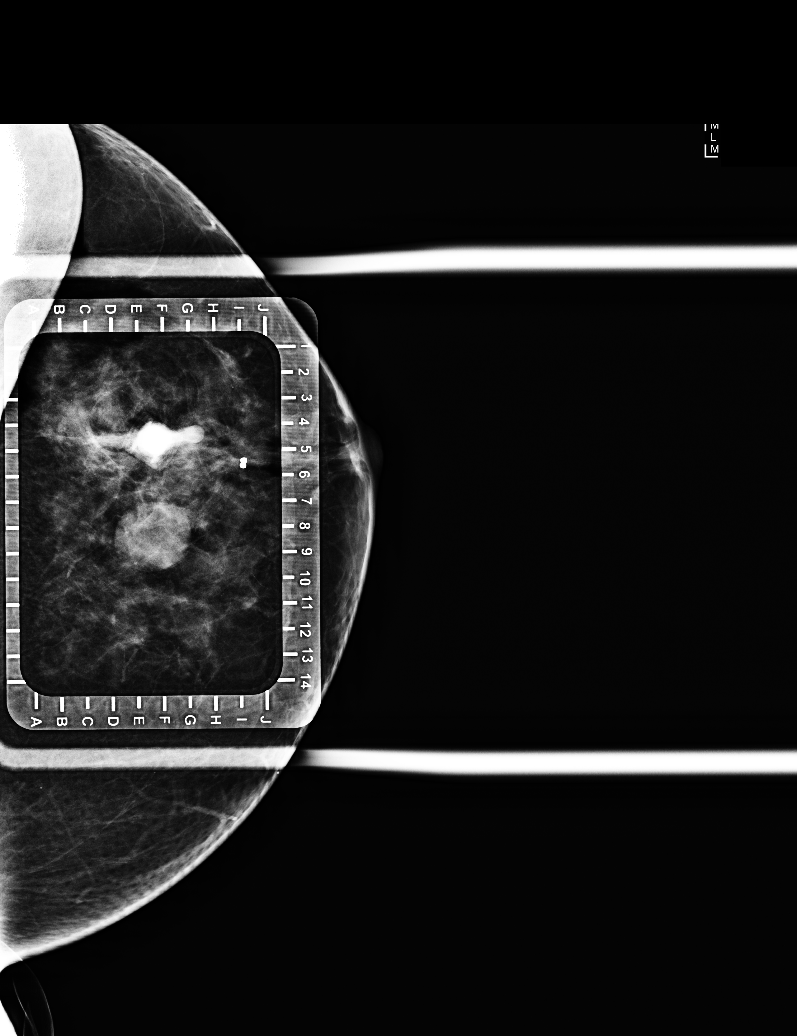

[L CC (2 of 3)]
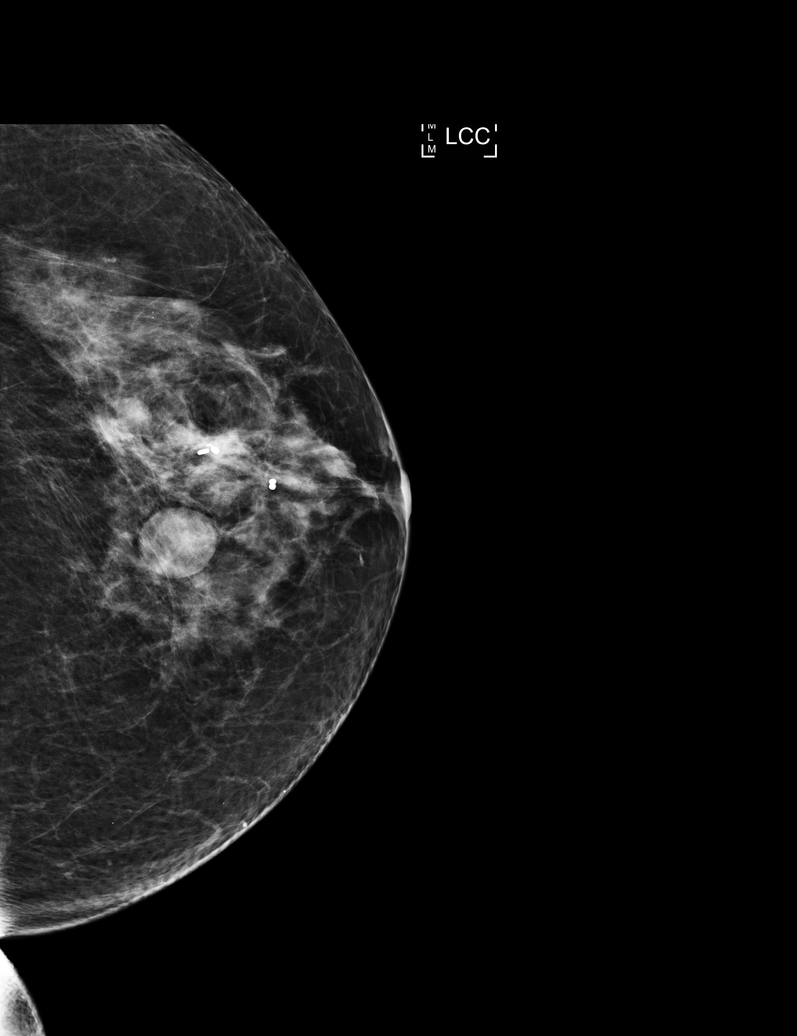

[L ML (1 of 2)]
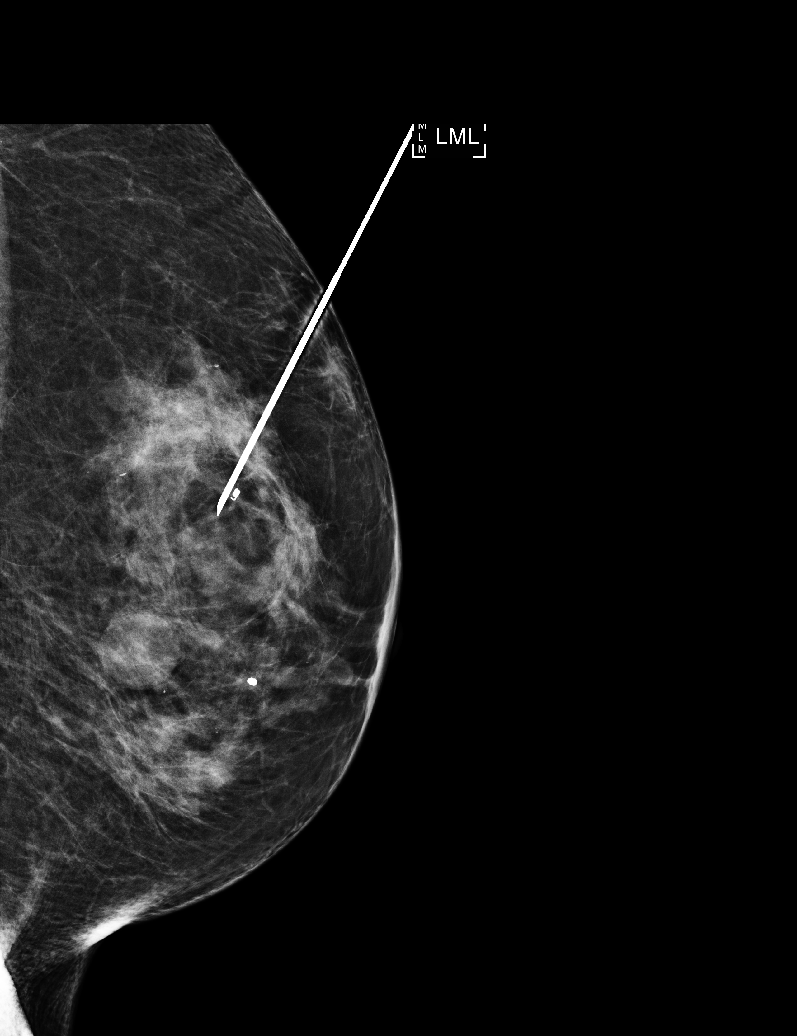

[L CC (3 of 3)]
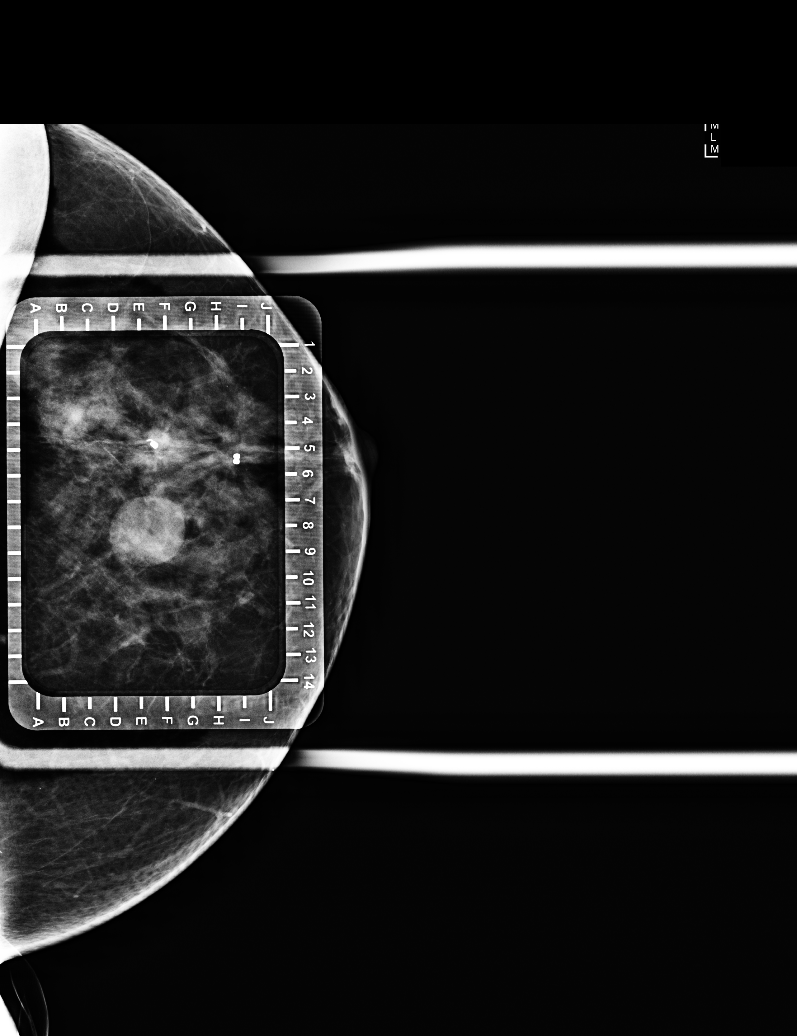

[L ML (2 of 2)]
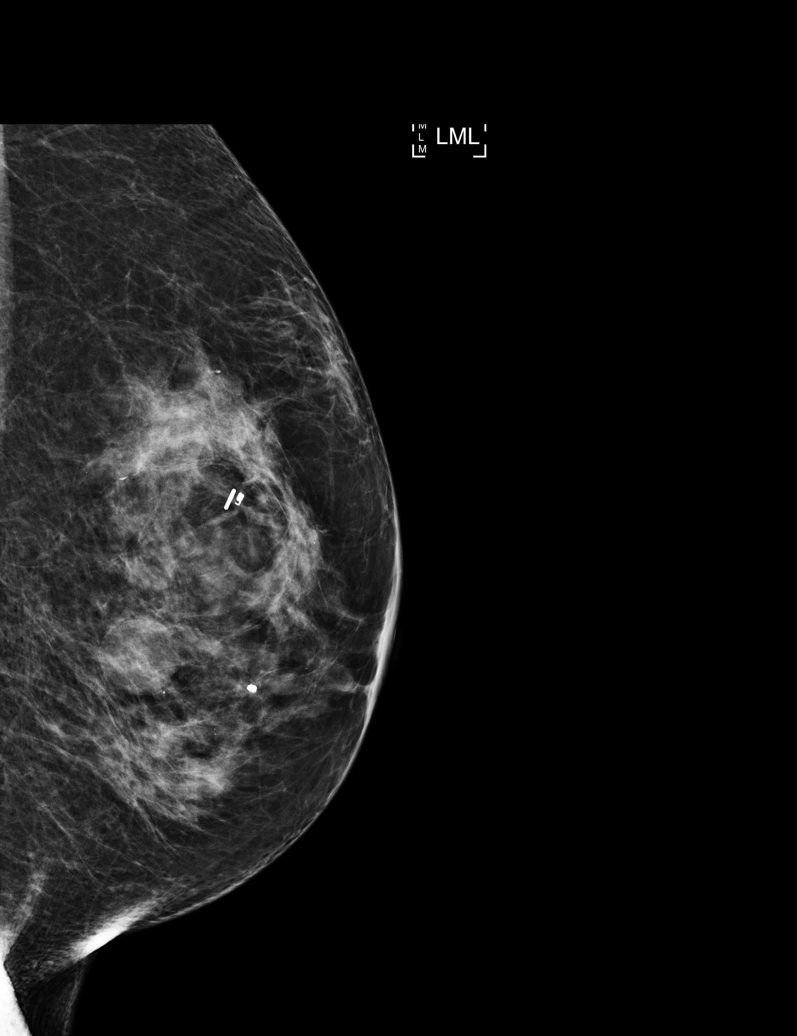

[5 of 5 positions shown; findings below may reference images not displayed]

FINDINGS: Patient presents for radioactive seed localization prior to surgical
excision. I met with the patient and we discussed the procedure of
seed localization including benefits and alternatives. We discussed
the high likelihood of a successful procedure. We discussed the
risks of the procedure including infection, bleeding, tissue injury
and further surgery. We discussed the low dose of radioactivity
involved in the procedure. Informed, written consent was given.

The usual time-out protocol was performed immediately prior to the
procedure.

Right breast

Using mammographic guidance, sterile technique, 1% lidocaine and an
[PS] radioactive seed, the dumbbell shaped biopsy clip was
localized using a superior approach. The follow-up mammogram images
confirm the seed in the expected location and were marked for Dr.
FAIZULLA.

Follow-up survey of the patient confirms presence of the radioactive
seed.

Order number of [PS] seed:  [PHONE_NUMBER].

Total activity:  0.233 millicuries reference Date: [DATE]

Left breast

Using mammographic guidance, sterile technique, 1% lidocaine and an
[PS] radioactive seed, the coil shaped biopsy clip was localized
using a superior approach. The follow-up mammogram images confirm
the seed in the expected location and were marked for Dr. FAIZULLA.

Follow-up survey of the patient confirms presence of the radioactive
seed.

Order number of [PS] seed:  [PHONE_NUMBER].

Total activity:  0.252 millicuries reference Date: [DATE]

The patient tolerated the procedure well and was released from the
[REDACTED]. She was given instructions regarding seed removal.
IMPRESSION: Radioactive seed localization bilateral breast. No apparent
complications.

## 2020-03-21 NOTE — Progress Notes (Signed)

## 2020-03-22 ENCOUNTER — Ambulatory Visit (HOSPITAL_BASED_OUTPATIENT_CLINIC_OR_DEPARTMENT_OTHER)
Admission: RE | Admit: 2020-03-22 | Discharge: 2020-03-22 | Disposition: A | Discharge status: Home / Self Care | Payer: BC Managed Care – PPO | Attending: Surgery | Admitting: Surgery

## 2020-03-22 ENCOUNTER — Other Ambulatory Visit: Payer: Self-pay

## 2020-03-22 ENCOUNTER — Ambulatory Visit (HOSPITAL_BASED_OUTPATIENT_CLINIC_OR_DEPARTMENT_OTHER): Payer: BC Managed Care – PPO | Admitting: Anesthesiology

## 2020-03-22 ENCOUNTER — Encounter (HOSPITAL_BASED_OUTPATIENT_CLINIC_OR_DEPARTMENT_OTHER): Payer: Self-pay | Admitting: Surgery

## 2020-03-22 ENCOUNTER — Encounter (HOSPITAL_BASED_OUTPATIENT_CLINIC_OR_DEPARTMENT_OTHER)
Admission: RE | Disposition: A | Discharge status: Home / Self Care | Payer: Self-pay | Source: Home / Self Care | Attending: Surgery

## 2020-03-22 ENCOUNTER — Ambulatory Visit
Admission: RE | Admit: 2020-03-22 | Discharge: 2020-03-22 | Disposition: A | Discharge status: Home / Self Care | Payer: BC Managed Care – PPO | Source: Ambulatory Visit | Attending: Surgery | Admitting: Surgery

## 2020-03-22 DIAGNOSIS — Z803 Family history of malignant neoplasm of breast: Secondary | ICD-10-CM | POA: Diagnosis not present

## 2020-03-22 DIAGNOSIS — N62 Hypertrophy of breast: Secondary | ICD-10-CM | POA: Insufficient documentation

## 2020-03-22 DIAGNOSIS — D0501 Lobular carcinoma in situ of right breast: Secondary | ICD-10-CM | POA: Insufficient documentation

## 2020-03-22 DIAGNOSIS — I10 Essential (primary) hypertension: Secondary | ICD-10-CM | POA: Insufficient documentation

## 2020-03-22 DIAGNOSIS — N6489 Other specified disorders of breast: Secondary | ICD-10-CM | POA: Insufficient documentation

## 2020-03-22 DIAGNOSIS — D0502 Lobular carcinoma in situ of left breast: Secondary | ICD-10-CM | POA: Diagnosis not present

## 2020-03-22 DIAGNOSIS — N6320 Unspecified lump in the left breast, unspecified quadrant: Secondary | ICD-10-CM

## 2020-03-22 DIAGNOSIS — N6310 Unspecified lump in the right breast, unspecified quadrant: Secondary | ICD-10-CM

## 2020-03-22 DIAGNOSIS — Z79899 Other long term (current) drug therapy: Secondary | ICD-10-CM | POA: Diagnosis not present

## 2020-03-22 HISTORY — PX: BREAST LUMPECTOMY WITH RADIOACTIVE SEED LOCALIZATION: SHX6424

## 2020-03-22 IMAGING — MG MM BREAST SURGICAL SPECIMEN
1 series · 2 of 2 positions shown · non-contrast
Comparison: Previous exam(s).

CLINICAL DATA: Evaluate specimens

EXAM:
SPECIMEN RADIOGRAPH OF THE BILATERAL BREAST

[Series 2: L · left · 0.07mm/px · 2 of 2 slices shown]
[im 1/2]
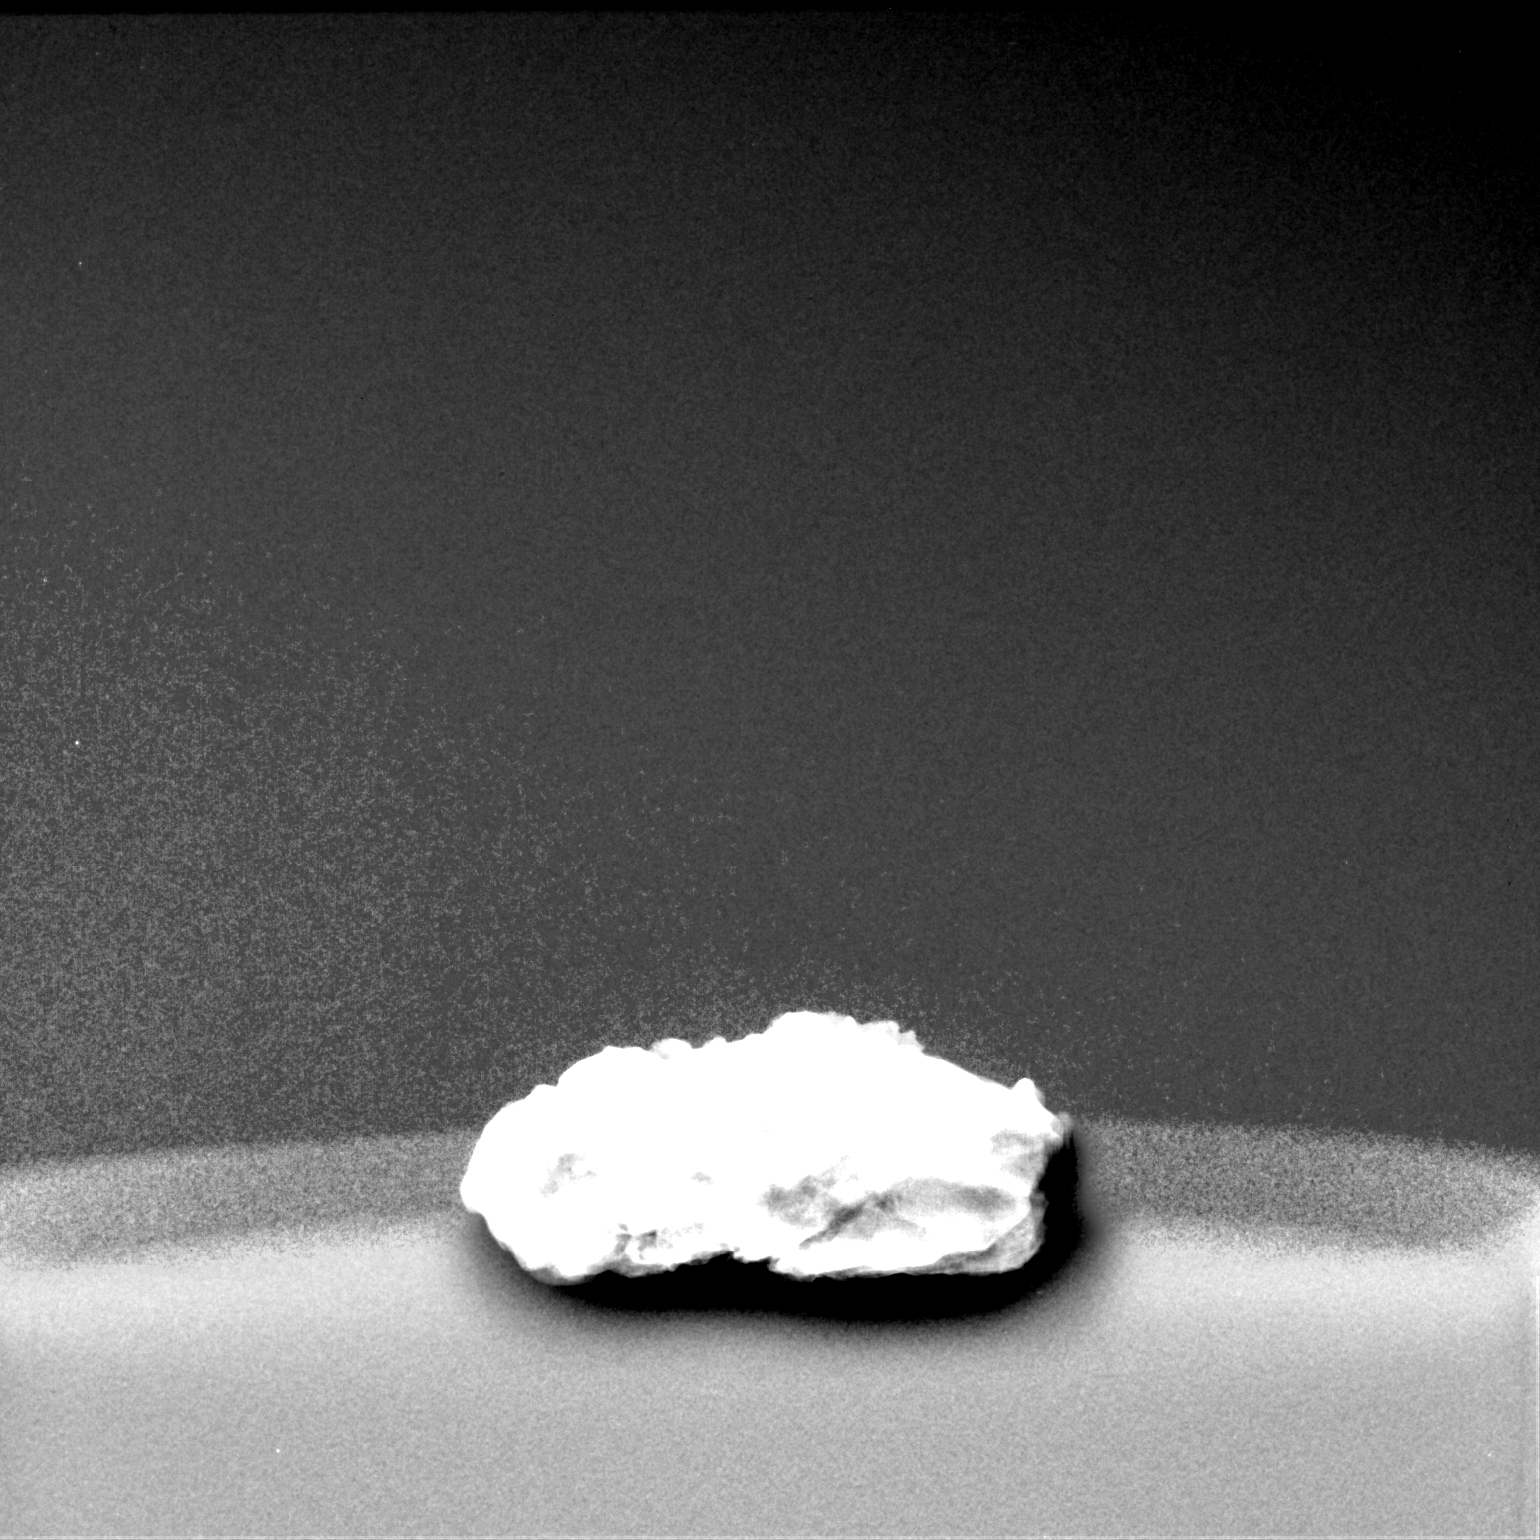
[im 2/2]
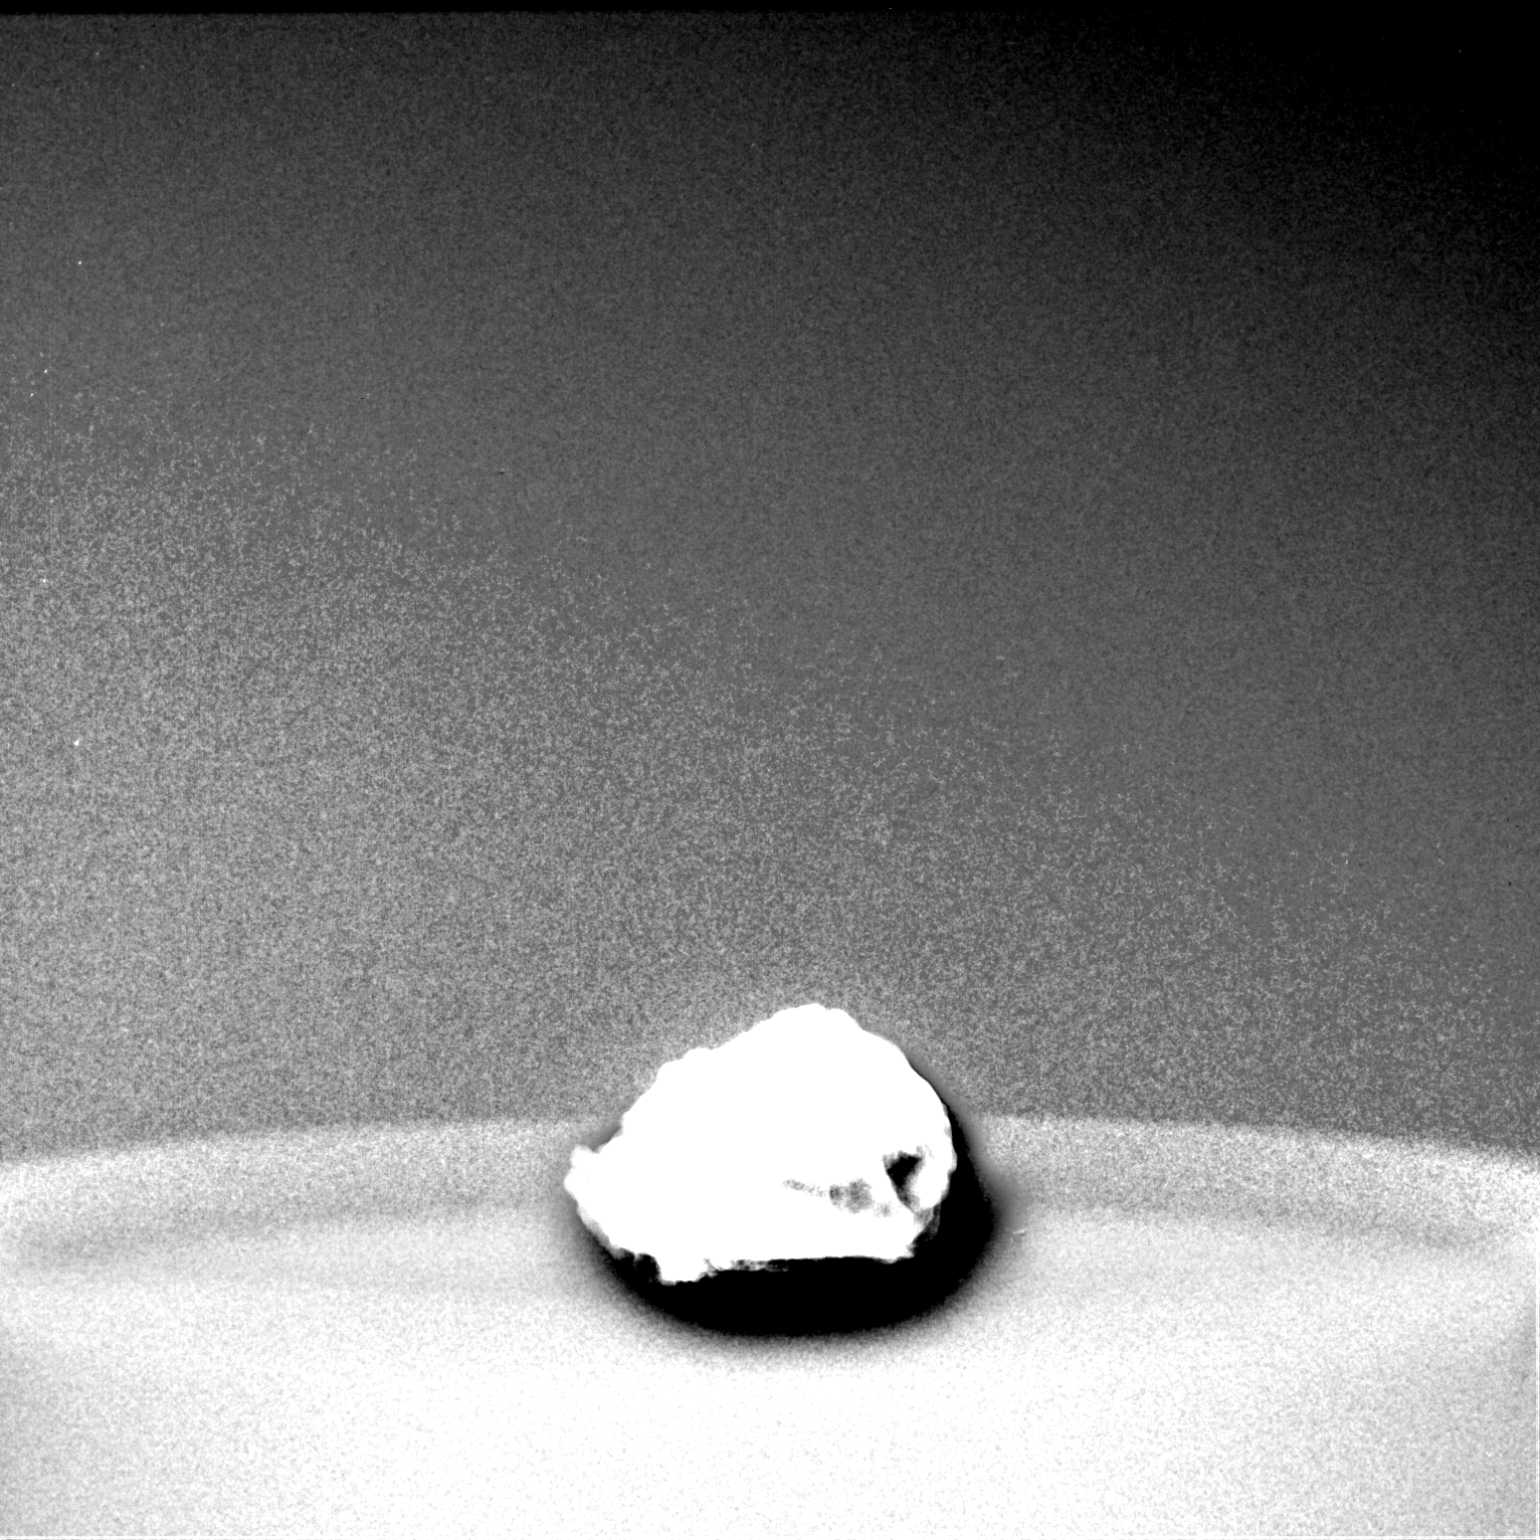

[2 of 2 positions shown; findings below may reference images not displayed]

FINDINGS: Status post excision of the bilateral breast. The radioactive seeds
and biopsy marker clips are present, completely intact, and were
marked for pathology.
IMPRESSION: Specimen radiograph of the bilateral breast.

## 2020-03-22 IMAGING — MG MM BREAST SURGICAL SPECIMEN
1 series · 2 of 2 positions shown · non-contrast
Comparison: Previous exam(s).

CLINICAL DATA: Evaluate specimens

EXAM:
SPECIMEN RADIOGRAPH OF THE BILATERAL BREAST

[Series 1: R · right · 0.07mm/px · 2 of 2 slices shown]
[im 1/2]
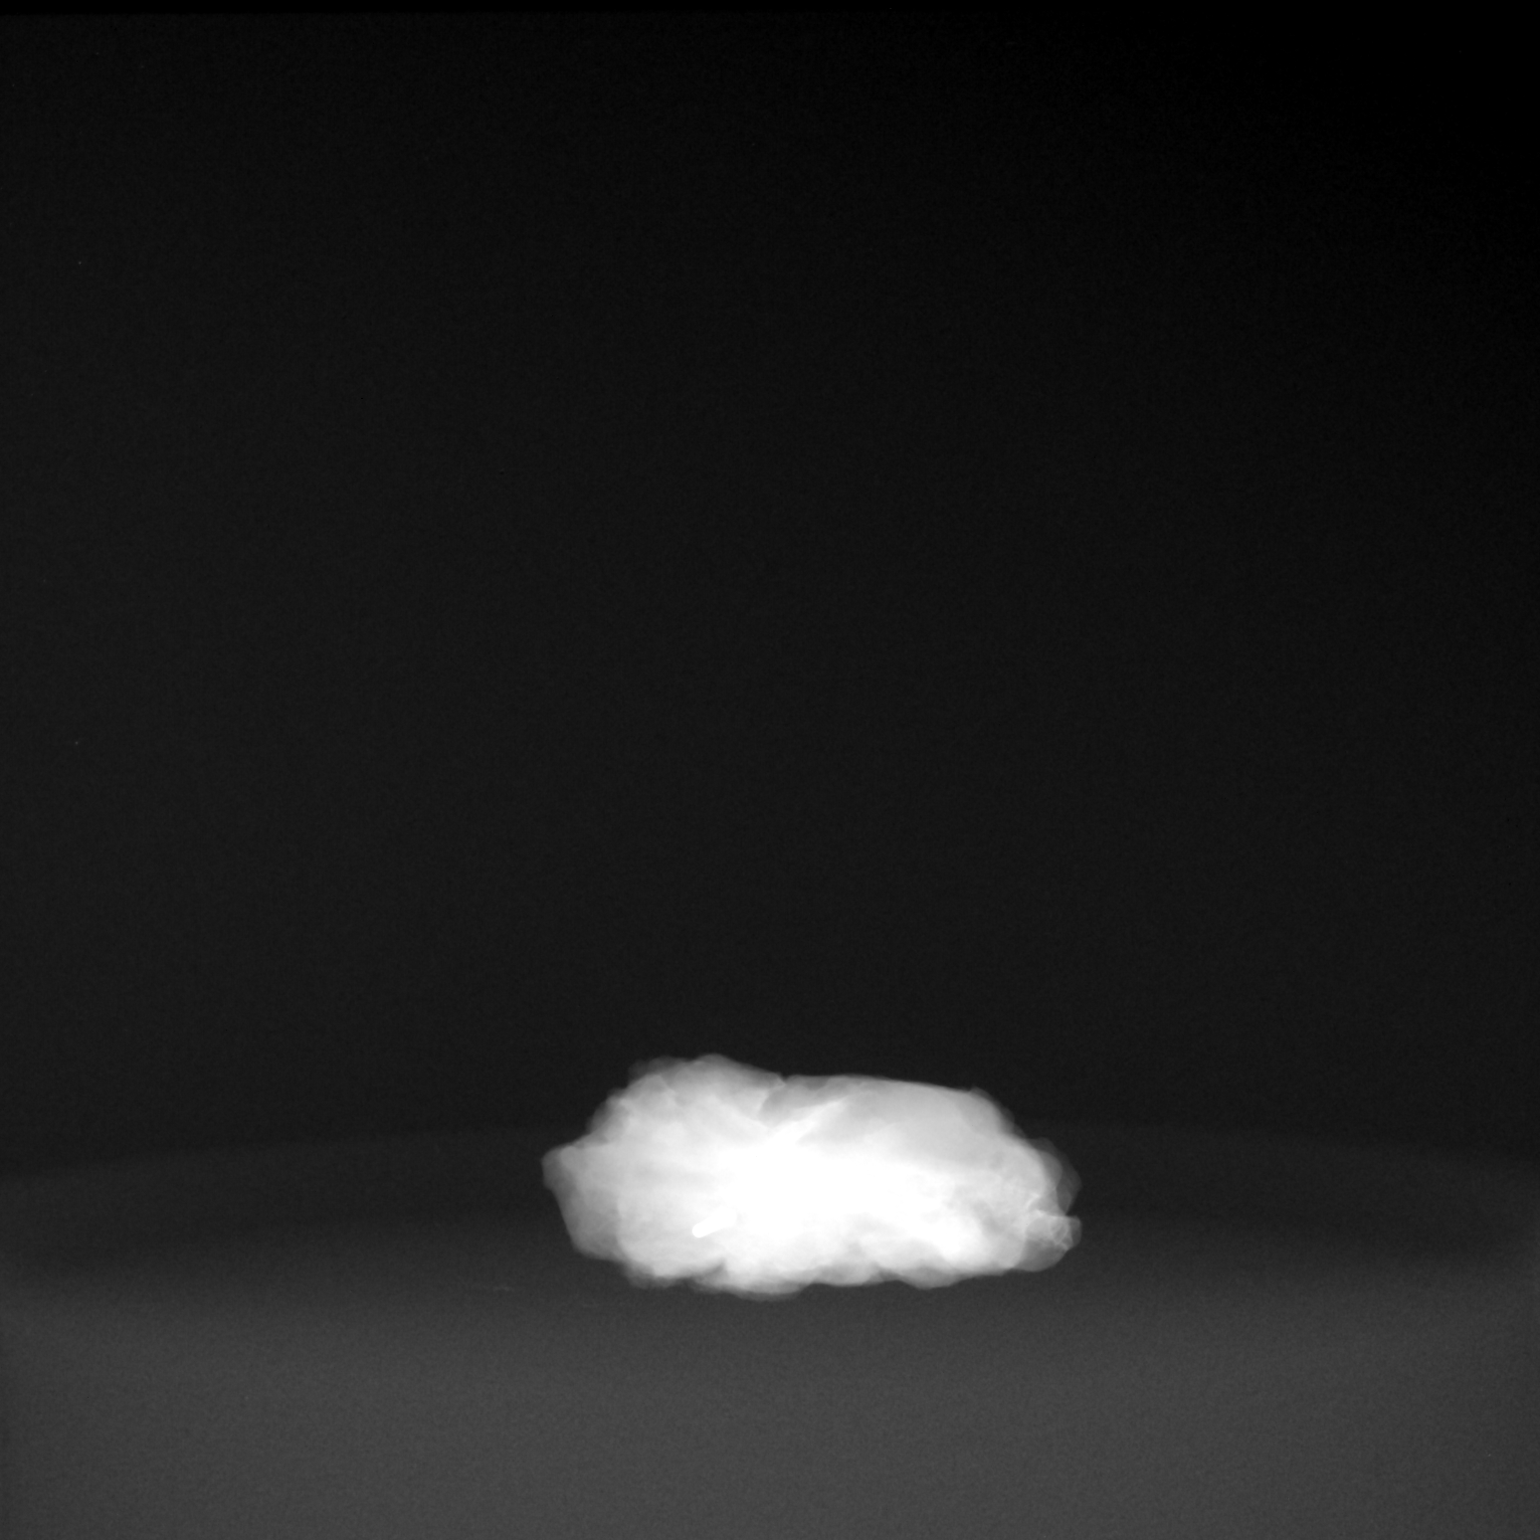
[im 2/2]
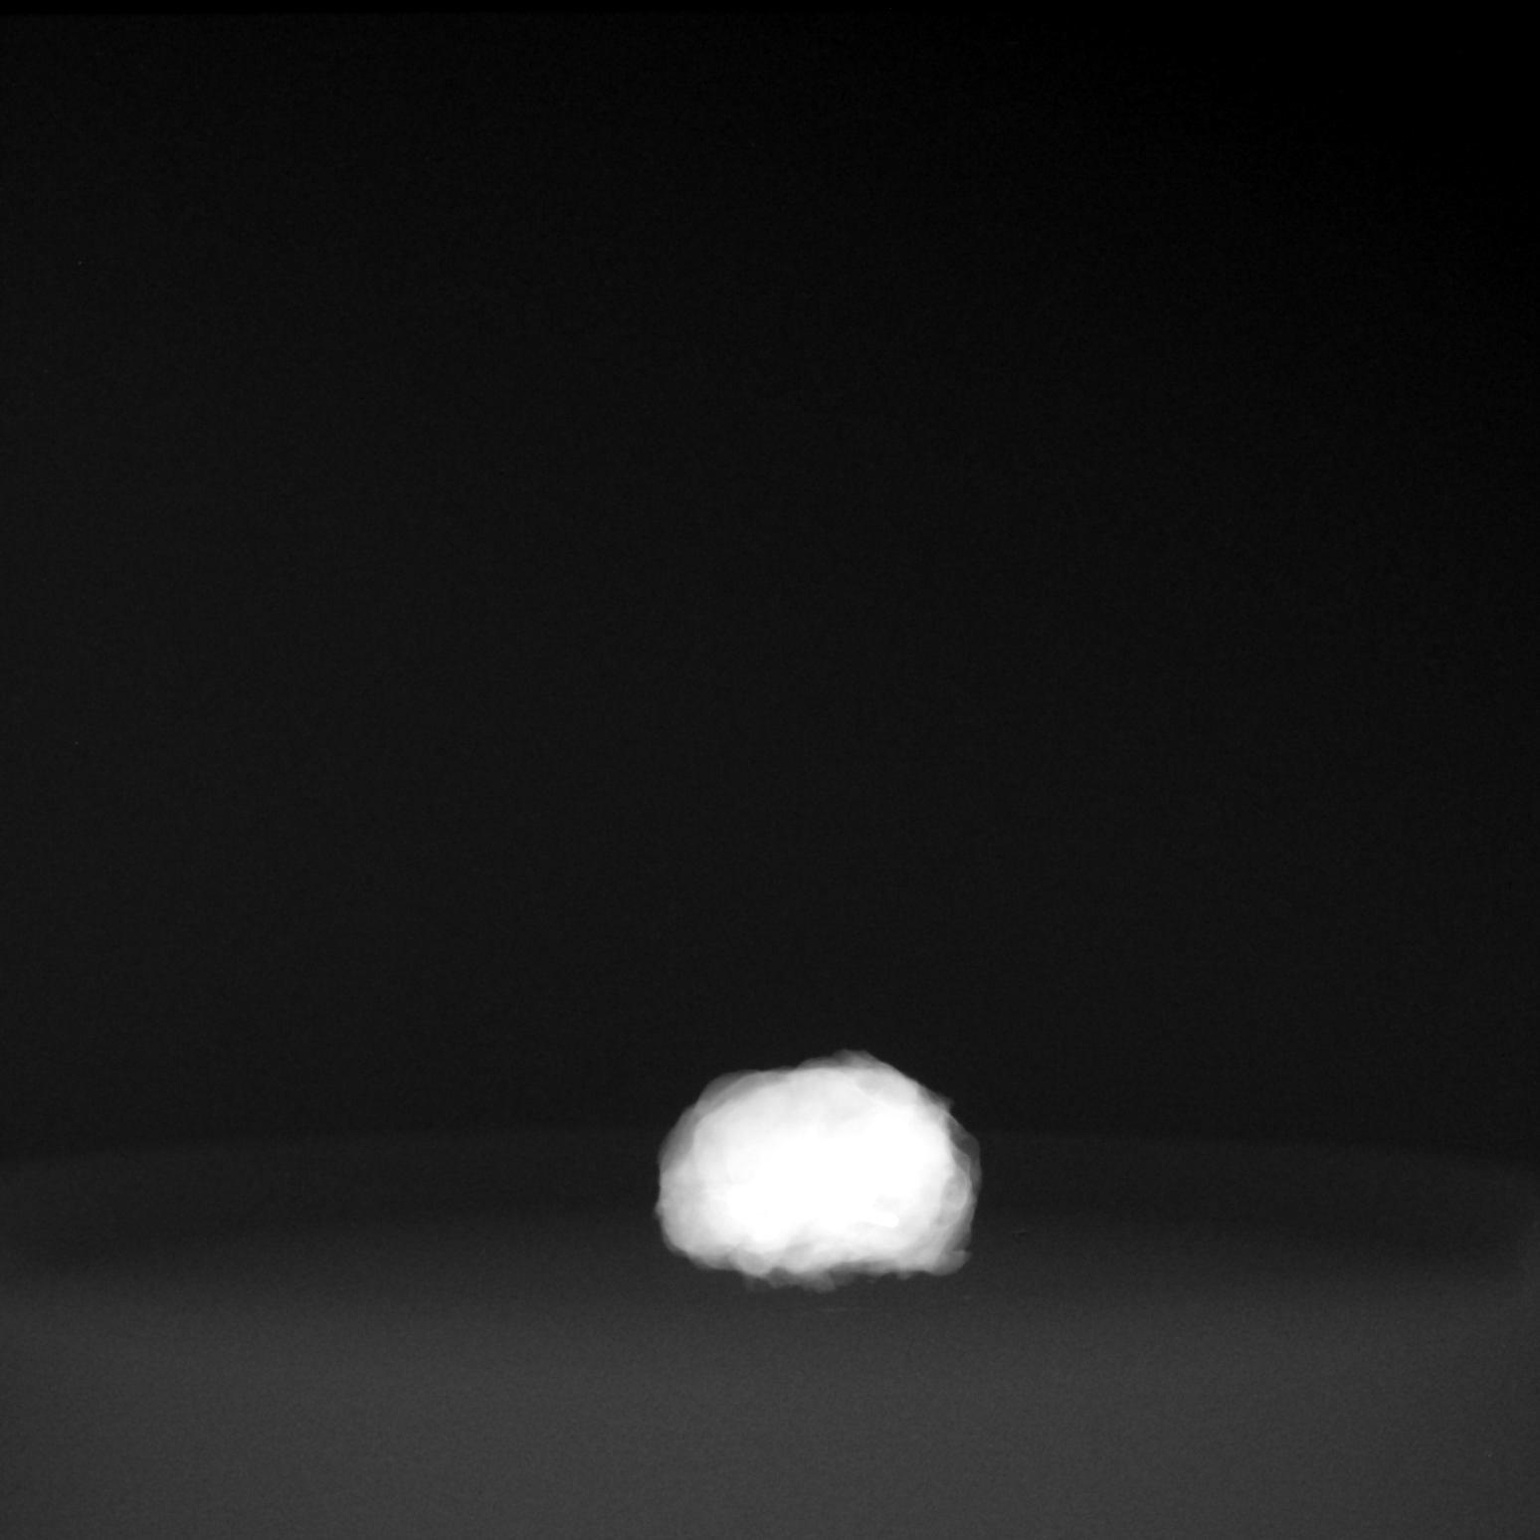

[2 of 2 positions shown; findings below may reference images not displayed]

FINDINGS: Status post excision of the bilateral breast. The radioactive seeds
and biopsy marker clips are present, completely intact, and were
marked for pathology.
IMPRESSION: Specimen radiograph of the bilateral breast.

## 2020-03-22 SURGERY — BREAST LUMPECTOMY WITH RADIOACTIVE SEED LOCALIZATION
Anesthesia: General | Site: Breast | Laterality: Bilateral

## 2020-03-22 MED ORDER — DEXAMETHASONE SODIUM PHOSPHATE 10 MG/ML IJ SOLN
INTRAMUSCULAR | Status: DC | PRN
  Administered 2020-03-22: 10 mg via INTRAVENOUS

## 2020-03-22 MED ORDER — FENTANYL CITRATE (PF) 100 MCG/2ML IJ SOLN
25.0000 ug | INTRAMUSCULAR | Status: DC | PRN
  Administered 2020-03-22: 25 ug via INTRAVENOUS

## 2020-03-22 MED ORDER — CHLORHEXIDINE GLUCONATE CLOTH 2 % EX PADS: 6.0000 | MEDICATED_PAD | Freq: Once | CUTANEOUS | Status: DC

## 2020-03-22 MED ORDER — FENTANYL CITRATE (PF) 100 MCG/2ML IJ SOLN
INTRAMUSCULAR | Status: AC
  Filled 2020-03-22: qty 2

## 2020-03-22 MED ORDER — GABAPENTIN 300 MG PO CAPS
300.0000 mg | ORAL_CAPSULE | ORAL | Status: AC
  Administered 2020-03-22: 300 mg via ORAL

## 2020-03-22 MED ORDER — LIDOCAINE HCL (CARDIAC) PF 100 MG/5ML IV SOSY
PREFILLED_SYRINGE | INTRAVENOUS | Status: DC | PRN
  Administered 2020-03-22: 100 mg via INTRATRACHEAL

## 2020-03-22 MED ORDER — ACETAMINOPHEN 500 MG PO TABS
1000.0000 mg | ORAL_TABLET | ORAL | Status: AC
  Administered 2020-03-22: 1000 mg via ORAL

## 2020-03-22 MED ORDER — ONDANSETRON HCL 4 MG/2ML IJ SOLN
INTRAMUSCULAR | Status: DC | PRN
  Administered 2020-03-22: 4 mg via INTRAVENOUS

## 2020-03-22 MED ORDER — LACTATED RINGERS IV SOLN: INTRAVENOUS | Status: DC

## 2020-03-22 MED ORDER — GABAPENTIN 300 MG PO CAPS
ORAL_CAPSULE | ORAL | Status: AC
  Filled 2020-03-22: qty 1

## 2020-03-22 MED ORDER — HYDROCODONE-ACETAMINOPHEN 5-325 MG PO TABS: 1.0000 | ORAL_TABLET | Freq: Four times a day (QID) | ORAL | 0 refills | Status: DC | PRN

## 2020-03-22 MED ORDER — PROMETHAZINE HCL 25 MG/ML IJ SOLN: 6.2500 mg | INTRAMUSCULAR | Status: DC | PRN

## 2020-03-22 MED ORDER — CEFAZOLIN SODIUM-DEXTROSE 2-4 GM/100ML-% IV SOLN
INTRAVENOUS | Status: AC
  Filled 2020-03-22: qty 100

## 2020-03-22 MED ORDER — MIDAZOLAM HCL 2 MG/2ML IJ SOLN
INTRAMUSCULAR | Status: AC
  Filled 2020-03-22: qty 2

## 2020-03-22 MED ORDER — PROPOFOL 10 MG/ML IV BOLUS
INTRAVENOUS | Status: DC | PRN
  Administered 2020-03-22: 150 mg via INTRAVENOUS

## 2020-03-22 MED ORDER — FENTANYL CITRATE (PF) 100 MCG/2ML IJ SOLN
INTRAMUSCULAR | Status: DC | PRN
  Administered 2020-03-22 (×2): 25 ug via INTRAVENOUS
  Administered 2020-03-22: 50 ug via INTRAVENOUS

## 2020-03-22 MED ORDER — MIDAZOLAM HCL 5 MG/5ML IJ SOLN
INTRAMUSCULAR | Status: DC | PRN
  Administered 2020-03-22: 2 mg via INTRAVENOUS

## 2020-03-22 MED ORDER — OXYCODONE HCL 5 MG PO TABS
ORAL_TABLET | ORAL | Status: AC
  Filled 2020-03-22: qty 1

## 2020-03-22 MED ORDER — BUPIVACAINE HCL 0.25 % IJ SOLN
INTRAMUSCULAR | Status: DC | PRN
  Administered 2020-03-22: 20 mL

## 2020-03-22 MED ORDER — PROPOFOL 10 MG/ML IV BOLUS
INTRAVENOUS | Status: AC
  Filled 2020-03-22: qty 20

## 2020-03-22 MED ORDER — CEFAZOLIN SODIUM-DEXTROSE 2-4 GM/100ML-% IV SOLN
2.0000 g | INTRAVENOUS | Status: AC
  Administered 2020-03-22: 2 g via INTRAVENOUS

## 2020-03-22 MED ORDER — OXYCODONE HCL 5 MG PO TABS
5.0000 mg | ORAL_TABLET | Freq: Once | ORAL | Status: AC | PRN
  Administered 2020-03-22: 5 mg via ORAL

## 2020-03-22 MED ORDER — OXYCODONE HCL 5 MG/5ML PO SOLN: 5.0000 mg | Freq: Once | ORAL | Status: AC | PRN

## 2020-03-22 MED ORDER — ONDANSETRON HCL 4 MG/2ML IJ SOLN
INTRAMUSCULAR | Status: AC
  Filled 2020-03-22: qty 2

## 2020-03-22 MED ORDER — ACETAMINOPHEN 500 MG PO TABS
ORAL_TABLET | ORAL | Status: AC
  Filled 2020-03-22: qty 2

## 2020-03-22 SURGICAL SUPPLY — 41 items
APL PRP STRL LF DISP 70% ISPRP (MISCELLANEOUS) ×1
APL SKNCLS STERI-STRIP NONHPOA (GAUZE/BANDAGES/DRESSINGS) ×1
BENZOIN TINCTURE PRP APPL 2/3 (GAUZE/BANDAGES/DRESSINGS) ×2 IMPLANT
BLADE SURG 15 STRL LF DISP TIS (BLADE) ×1 IMPLANT
BLADE SURG 15 STRL SS (BLADE) ×2
CANISTER SUCT 1200ML W/VALVE (MISCELLANEOUS) ×1 IMPLANT
CHLORAPREP W/TINT 26 (MISCELLANEOUS) ×2 IMPLANT
COVER BACK TABLE 60X90IN (DRAPES) ×2 IMPLANT
COVER MAYO STAND STRL (DRAPES) ×2 IMPLANT
COVER PROBE W GEL 5X96 (DRAPES) ×2 IMPLANT
DRAPE LAPAROSCOPIC ABDOMINAL (DRAPES) ×1 IMPLANT
DRAPE UTILITY XL STRL (DRAPES) ×2 IMPLANT
DRSG TEGADERM 4X4.75 (GAUZE/BANDAGES/DRESSINGS) ×3 IMPLANT
ELECT REM PT RETURN 9FT ADLT (ELECTROSURGICAL) ×2
ELECTRODE REM PT RTRN 9FT ADLT (ELECTROSURGICAL) ×1 IMPLANT
GAUZE SPONGE 4X4 12PLY STRL LF (GAUZE/BANDAGES/DRESSINGS) ×1 IMPLANT
GLOVE BIO SURGEON STRL SZ 6.5 (GLOVE) ×1 IMPLANT
GLOVE BIO SURGEON STRL SZ7 (GLOVE) ×2 IMPLANT
GLOVE BIOGEL PI IND STRL 6.5 (GLOVE) IMPLANT
GLOVE BIOGEL PI IND STRL 7.5 (GLOVE) ×1 IMPLANT
GLOVE BIOGEL PI INDICATOR 6.5 (GLOVE) ×1
GLOVE BIOGEL PI INDICATOR 7.5 (GLOVE) ×1
GOWN STRL REUS W/ TWL LRG LVL3 (GOWN DISPOSABLE) ×2 IMPLANT
GOWN STRL REUS W/TWL LRG LVL3 (GOWN DISPOSABLE) ×4
KIT MARKER MARGIN INK (KITS) ×2 IMPLANT
NDL HYPO 25X1 1.5 SAFETY (NEEDLE) ×1 IMPLANT
NEEDLE HYPO 25X1 1.5 SAFETY (NEEDLE) ×2 IMPLANT
NS IRRIG 1000ML POUR BTL (IV SOLUTION) ×2 IMPLANT
PACK BASIN DAY SURGERY FS (CUSTOM PROCEDURE TRAY) ×2 IMPLANT
PENCIL SMOKE EVACUATOR (MISCELLANEOUS) ×2 IMPLANT
SLEEVE SCD COMPRESS KNEE MED (MISCELLANEOUS) ×2 IMPLANT
SPONGE LAP 4X18 RFD (DISPOSABLE) ×3 IMPLANT
STRIP CLOSURE SKIN 1/2X4 (GAUZE/BANDAGES/DRESSINGS) ×2 IMPLANT
SUT MON AB 4-0 PC3 18 (SUTURE) ×3 IMPLANT
SUT VIC AB 3-0 SH 27 (SUTURE) ×4
SUT VIC AB 3-0 SH 27X BRD (SUTURE) ×1 IMPLANT
SYR CONTROL 10ML LL (SYRINGE) ×2 IMPLANT
TOWEL GREEN STERILE FF (TOWEL DISPOSABLE) ×2 IMPLANT
TRAY FAXITRON CT DISP (TRAY / TRAY PROCEDURE) ×3 IMPLANT
TUBE CONNECTING 20X1/4 (TUBING) ×1 IMPLANT
YANKAUER SUCT BULB TIP NO VENT (SUCTIONS) ×1 IMPLANT

## 2020-03-22 NOTE — Anesthesia Preprocedure Evaluation (Addendum)
Anesthesia Evaluation  Patient identified by MRN, date of birth, ID band  Reviewed: Allergy & Precautions, NPO status , Patient's Chart, lab work & pertinent test results  History of Anesthesia Complications Negative for: history of anesthetic complications  Airway Mallampati: II  TM Distance: >3 FB Neck ROM: Full    Dental no notable dental hx.    Pulmonary neg pulmonary ROS,    Pulmonary exam normal breath sounds clear to auscultation       Cardiovascular Exercise Tolerance: Good hypertension, Normal cardiovascular exam Rhythm:Regular Rate:Normal     Neuro/Psych negative neurological ROS  negative psych ROS   GI/Hepatic negative GI ROS, Neg liver ROS,   Endo/Other  negative endocrine ROS  Renal/GU negative Renal ROS  negative genitourinary   Musculoskeletal negative musculoskeletal ROS (+)   Abdominal   Peds negative pediatric ROS (+)  Hematology negative hematology ROS (+)   Anesthesia Other Findings   Reproductive/Obstetrics negative OB ROS                             Anesthesia Physical Anesthesia Plan  ASA: II  Anesthesia Plan: General   Post-op Pain Management:    Induction:   PONV Risk Score and Plan: 3 and Ondansetron, Dexamethasone and Treatment may vary due to age or medical condition  Airway Management Planned: LMA  Additional Equipment:   Intra-op Plan:   Post-operative Plan:   Informed Consent: I have reviewed the patients History and Physical, chart, labs and discussed the procedure including the risks, benefits and alternatives for the proposed anesthesia with the patient or authorized representative who has indicated his/her understanding and acceptance.     Dental advisory given  Plan Discussed with:   Anesthesia Plan Comments:         Anesthesia Quick Evaluation

## 2020-03-22 NOTE — Op Note (Signed)
Pre-op Diagnosis:  Bilateral complex sclerosing lesions Post-op Diagnosis: same Procedure:  Bilateral radioactive seed localized lumpectomies Surgeon:  Coralynn Gaona K. Anesthesia:  GEN - LMA Indications:  This is a 59 year old female who presents with radiologic findings in both breasts.  Biopsies showed bilateral complex sclerosing lesions.  She presents now for excision.  Radioactive seeds were placed on both sides yesterday.  Description of procedure: The patient is brought to the operating room placed in supine position on the operating room table. After an adequate level of general anesthesia was obtained, her left breast was prepped with ChloraPrep and draped in sterile fashion. A timeout was taken to ensure the proper patient and proper procedure. We interrogated the breast with the neoprobe. We made a circumareolar incision around the upper side of the nipple after infiltrating with 0.25% Marcaine. Dissection was carried down in the breast tissue with cautery. We used the neoprobe to guide [place] towards the radioactive seed. We excised an area of tissue around the radioactive seed 1.5 cm in diameter. The specimen was removed and was oriented with a paint kit. Specimen mammogram showed the radioactive seed as well as the biopsy clip within the specimen. This was sent for pathologic examination. There is no residual radioactivity within the biopsy cavity. We inspected carefully for hemostasis. The wound was thoroughly irrigated. The wound was closed with a deep layer of 3-0 Vicryl and a subcuticular layer of 4-0 Monocryl.   We turned our attention to the right side.  We interrogated the breast with the neoprobe. We made a circumareolar incision around the upper side of the nipple after infiltrating with 0.25% Marcaine. Dissection was carried down in the breast tissue with cautery. We used the neoprobe to guide [place] towards the radioactive seed. We excised an area of tissue around the radioactive seed  1.5 cm in diameter.  A large cyst containing brownish drainage was encountered and completely evacuated. The specimen was removed and was oriented with a paint kit. Specimen mammogram showed the radioactive seed as well as the biopsy clip within the specimen. This was sent for pathologic examination. There is no residual radioactivity within the biopsy cavity. We inspected carefully for hemostasis. The wound was thoroughly irrigated. The wound was closed with a deep layer of 3-0 Vicryl and a subcuticular layer of 4-0 Monocryl.   Benzoin Steri-Strips were applied to both incisions. The patient was then extubated and brought to the recovery room in stable condition. All sponge, instrument, and needle counts are correct.  Imogene Burn. Georgette Dover, MD, [facility]  General/ Trauma Surgery  03/22/2020 12:39 PM

## 2020-03-22 NOTE — Anesthesia Postprocedure Evaluation (Signed)
Anesthesia Post Note  Patient: Autumn Patel  Procedure(s) Performed: BILATERAL BREAST LUMPECTOMY WITH RADIOACTIVE SEED LOCALIZATION (Bilateral Breast)     Patient location during evaluation: Phase II Anesthesia Type: General Level of consciousness: awake and alert Pain management: pain level controlled Vital Signs Assessment: post-procedure vital signs reviewed and stable Respiratory status: spontaneous breathing, nonlabored ventilation and respiratory function stable Cardiovascular status: blood pressure returned to baseline and stable Postop Assessment: no apparent nausea or vomiting Anesthetic complications: no   No complications documented.  Last Vitals:  Vitals:   03/22/20 1315 03/22/20 1330  BP: 133/84 (!) 136/94  Pulse: 69 71  Resp: 15 18  Temp:    SpO2: 100% 96%    Last Pain:  Vitals:   03/22/20 1330  TempSrc:   PainSc: 3                  Candra R Ivannah Zody

## 2020-03-22 NOTE — Transfer of Care (Signed)
Immediate Anesthesia Transfer of Care Note  Patient: Autumn Patel  Procedure(s) Performed: BILATERAL BREAST LUMPECTOMY WITH RADIOACTIVE SEED LOCALIZATION (Bilateral Breast)  Patient Location: PACU  Anesthesia Type:General  Level of Consciousness: drowsy, patient cooperative and responds to stimulation  Airway & Oxygen Therapy: Patient Spontanous Breathing and Patient connected to face mask oxygen  Post-op Assessment: Report given to RN and Post -op Vital signs reviewed and stable  Post vital signs: Reviewed and stable  Last Vitals:  Vitals Value Taken Time  BP    Temp    Pulse 83 03/22/20 1247  Resp 8 03/22/20 1247  SpO2 99 % 03/22/20 1247  Vitals shown include unvalidated device data.  Last Pain:  Vitals:   03/22/20 1013  TempSrc: Oral  PainSc: 0-No pain      Patients Stated Pain Goal: 3 (03/22/20 1013)  Complications: No complications documented.

## 2020-03-22 NOTE — Discharge Instructions (Signed)
Took Tylenol 1000 mg at 10:15 this morning. Pain pill taken at 2pm. May take pain pill at or after 8p if needed.  [facility] [facility] Office Phone Number [phone]  BREAST BIOPSY/ PARTIAL MASTECTOMY: POST OP INSTRUCTIONS  Always review your discharge instruction sheet given to you by the facility where your surgery was performed.  IF YOU HAVE DISABILITY OR FAMILY LEAVE FORMS, YOU MUST BRING THEM TO THE OFFICE FOR PROCESSING.  DO NOT GIVE THEM TO YOUR DOCTOR.  1. A prescription for pain medication may be given to you upon discharge.  Take your pain medication as prescribed, if needed.  If narcotic pain medicine is not needed, then you may take acetaminophen (Tylenol) or ibuprofen (Advil) as needed. 2. Take your usually prescribed medications unless otherwise directed 3. If you need a refill on your pain medication, please contact your pharmacy.  They will contact our office to request authorization.  Prescriptions will not be filled after 5pm or on week-ends. 4. You should eat very light the first 24 hours after surgery, such as soup, crackers, pudding, etc.  Resume your normal diet the day after surgery. 5. Most patients will experience some swelling and bruising in the breast.  Ice packs and a good support bra will help.  Swelling and bruising can take several days to resolve.  6. It is common to experience some constipation if taking pain medication after surgery.  Increasing fluid intake and taking a stool softener will usually help or prevent this problem from occurring.  A mild laxative (Milk of Magnesia or Miralax) should be taken according to package directions if there are no bowel movements after 48 hours. 7. Unless discharge instructions indicate otherwise, you may remove your bandages 24-48 hours after surgery, and you may shower at that time.  You may have steri-strips (small skin tapes) in place directly over the incision.  These strips should be left on the skin for 7-10  days.  If your surgeon used skin glue on the incision, you may shower in 24 hours.  The glue will flake off over the next 2-3 weeks.  Any sutures or staples will be removed at the office during your follow-up visit. 8. ACTIVITIES:  You may resume regular daily activities (gradually increasing) beginning the next day.  Wearing a good support bra or sports bra minimizes pain and swelling.  You may have sexual intercourse when it is comfortable. a. You may drive when you no longer are taking prescription pain medication, you can comfortably wear a seatbelt, and you can safely maneuver your car and apply brakes. b. RETURN TO WORK:  ______________________________________________________________________________________ 9. You should see your doctor in the office for a follow-up appointment approximately two weeks after your surgery.  Your doctor's nurse will typically make your follow-up appointment when she calls you with your pathology report.  Expect your pathology report 2-3 business days after your surgery.  You may call to check if you do not hear from [place] after three days. 10. OTHER INSTRUCTIONS: _______________________________________________________________________________________________ _____________________________________________________________________________________________________________________________________ _____________________________________________________________________________________________________________________________________ _____________________________________________________________________________________________________________________________________  WHEN TO CALL YOUR DOCTOR: 1. Fever over 101.0 2. Nausea and/or vomiting. 3. Extreme swelling or bruising. 4. Continued bleeding from incision. 5. Increased pain, redness, or drainage from the incision.  The clinic staff is available to answer your questions during regular business hours.  Please don't hesitate to call  and ask to speak to one of the nurses for clinical concerns.  If you have a medical emergency, go to the nearest emergency room or  call 911.  A surgeon from [facility] is always on call at the hospital.  For further questions, please visit [email or web address]    Post Anesthesia Home Care Instructions  Activity: Get plenty of rest for the remainder of the day. A responsible individual must stay with you for 24 hours following the procedure.  For the next 24 hours, DO NOT: -Drive a car -Advertising copywriter -Drink alcoholic beverages -Take any medication unless instructed by your physician -Make any legal decisions or sign important papers.  Meals: Start with liquid foods such as gelatin or soup. Progress to regular foods as tolerated. Avoid greasy, spicy, heavy foods. If nausea and/or vomiting occur, drink only clear liquids until the nausea and/or vomiting subsides. Call your physician if vomiting continues.  Special Instructions/Symptoms: Your throat may feel dry or sore from the anesthesia or the breathing tube placed in your throat during surgery. If this causes discomfort, gargle with warm salt water. The discomfort should disappear within 24 hours.  If you had a scopolamine patch placed behind your ear for the management of post- operative nausea and/or vomiting:  1. The medication in the patch is effective for 72 hours, after which it should be removed.  Wrap patch in a tissue and discard in the trash. Wash hands thoroughly with soap and water. 2. You may remove the patch earlier than 72 hours if you experience unpleasant side effects which may include dry mouth, dizziness or visual disturbances. 3. Avoid touching the patch. Wash your hands with soap and water after contact with the patch.

## 2020-03-22 NOTE — H&P (Signed)
PCP - Broadus John Pickagrd Referred by Dr. Annamaria Helling for Left breast mass  This is a 59 year old female who presents after recent screening mammogram showed a question of some left breast distortion. She underwent further workup including mammogram and ultrasound as well as a core biopsy. This revealed the finding of complex sclerosing lesion. No sign of atypia. However the patient has a strong family history with 2 sisters with breast cancer. Both of these were diagnosed in their 86s. The patient thinks that she may have had genetic testing but thinks that it might have been several years ago. She does not remember the exact date. She has a history of cysts in both breasts. One of these was removed and several have been aspirated. She also has class C dense breasts.  Further work-up with MRI and second biopsy showed a right CSL.  She underwent menopause around age 44.   CLINICAL DATA: The patient was called back due to left breast distortion.  EXAM: DIGITAL DIAGNOSTIC LEFT MAMMOGRAM WITH TOMO  ULTRASOUND LEFT BREAST  COMPARISON: Previous exam(s).  ACR Breast Density Category c: The breast tissue is heterogeneously dense, which may obscure small masses.  FINDINGS: The left breast distortion persists on additional imaging located in the superior left breast, best seen on the cc view.  On physical exam, no suspicious lumps are identified.  Targeted ultrasound is performed, showing no correlate for the left breast distortion. No left axillary adenopathy.  IMPRESSION: Left breast distortion with no sonographic correlate.  RECOMMENDATION: Stereotactic biopsy of the left breast distortion.  The patient had 2 sisters diagnosed with breast cancer in their 74s. She is unsure whether she has been tested genetically in the past. If the patient has not been genetically tested since January 2015, recommend consultation with a Dentist. If the patient is found to be  genetically negative, recommend assessing her lifetime risk of breast cancer with a statistical model such as the Tyrer-Cuzick. If she is genetically negative but has a lifetime risk of greater than 20%, recommend annual mammography and breast MRI.  I have discussed the findings and recommendations with the patient. If applicable, a reminder letter will be sent to the patient regarding the next appointment.  BI-RADS CATEGORY 4: Suspicious.   Electronically Signed By: Gerome Sam III M.D On: 10/21/2019 11:42  CLINICAL DATA: 59 year old female with a suspicious area of distortion in the upper slightly outer left breast.  EXAM: LEFT BREAST STEREOTACTIC CORE NEEDLE BIOPSY  COMPARISON: Previous exams.  FINDINGS: The patient and I discussed the procedure of stereotactic-guided biopsy including benefits and alternatives. We discussed the high likelihood of a successful procedure. We discussed the risks of the procedure including infection, bleeding, tissue injury, clip migration, and inadequate sampling. Informed written consent was given. The usual time out protocol was performed immediately prior to the procedure.  Using sterile technique and 1% Lidocaine as local anesthetic, under stereotactic guidance, a 9 gauge vacuum assisted device was used to perform core needle biopsy of the distortion in the upper central to slightly outer left breast using a superior to inferior approach.  Lesion quadrant: Upper-outer  At the conclusion of the procedure, a coil shaped tissue marker clip was deployed into the biopsy cavity. Follow-up 2-view mammogram was performed and dictated separately.  IMPRESSION: Stereotactic-guided biopsy of the distortion in the upper slightly outer left breast. No apparent complications.  Electronically Signed: By: Edwin Cap M.D. On: 11/10/2019 08:44  ADDENDUM: Pathology revealed COMPLEX SCLEROSING LESION WITH USUAL DUCTAL HYPERPLASIA,  FIBROCYSTIC CHANGE WITH CALCIFICATIONS of the LEFT breast. This was found to be concordant by Dr. Edwin Cap, with excision recommended.  Pathology results were discussed with the patient by telephone. The patient reported doing well after the biopsy with tenderness at the site. Post biopsy instructions and care were reviewed and questions were answered. The patient was encouraged to call [facility] for any additional concerns.  Surgical consultation has been arranged with Dr. Manus Rudd at [facility] on Dec 07, 2019.  High risk screening MRI is recommended.  Pathology results reported by Collene Mares RN on 11/11/2019.   Electronically Signed By: Edwin Cap M.D. On: 11/11/2019 11:25    Problem List/Past Medical  COMPLEX SCLEROSING LESION OF LEFT BREAST (N64.89)   Past Surgical History  Breast Biopsy  Left. Cesarean Section - 1  Gallbladder Surgery - Laparoscopic  Oral Surgery   Diagnostic Studies History  Colonoscopy  1-5 years ago Mammogram  1-3 years ago  Allergies  No Known Drug Allergies   Medication History  Phentermine (37.5mg  Capsule, Oral) Active. Multivitamin (Oral) Active. Medications Reconciled Valium (10MG  Tablet, 1 (one) Oral as needed, Taken starting 12/07/2019) Active. (as directed)  Social History Alcohol use  Occasional alcohol use. Caffeine use  Carbonated beverages, Coffee, Tea. No drug use   Family History Breast Cancer  Sister. Cerebrovascular Accident  Father. Depression  Father. Heart Disease  Father. Hypertension  Father. Melanoma  Sister. Respiratory Condition  Mother. Thyroid problems  Mother.  Pregnancy / Birth History  Age at menarche  14 years. Age of menopause  51-55 Contraceptive History  Oral contraceptives. Gravida  3 Length (months) of breastfeeding  3-6 Maternal age  91-30 Para  3  PMH Back Pain  High blood pressure  Lump In  Breast  Migraine Headache     Review of Systems  General Not Present- Appetite Loss, Chills, Fatigue, Fever, Night Sweats, Weight Gain and Weight Loss. Skin Not Present- Change in Wart/Mole, Dryness, Hives, Jaundice, New Lesions, Non-Healing Wounds, Rash and Ulcer. HEENT Present- Seasonal Allergies. Not Present- Earache, Hearing Loss, Hoarseness, Nose Bleed, Oral Ulcers, Ringing in the Ears, Sinus Pain, Sore Throat, Visual Disturbances, Wears glasses/contact lenses and Yellow Eyes. Respiratory Not Present- Bloody sputum, Chronic Cough, Difficulty Breathing, Snoring and Wheezing. Breast Present- Breast Mass. Not Present- Breast Pain, Nipple Discharge and Skin Changes. Cardiovascular Not Present- Chest Pain, Difficulty Breathing Lying Down, Leg Cramps, Palpitations, Rapid Heart Rate, Shortness of Breath and Swelling of Extremities. Gastrointestinal Not Present- Abdominal Pain, Bloating, Bloody Stool, Change in Bowel Habits, Chronic diarrhea, Constipation, Difficulty Swallowing, Excessive gas, Gets full quickly at meals, Hemorrhoids, Indigestion, Nausea, Rectal Pain and Vomiting. Female Genitourinary Not Present- Frequency, Nocturia, Painful Urination, Pelvic Pain and Urgency. Musculoskeletal Not Present- Back Pain, Joint Pain, Joint Stiffness, Muscle Pain, Muscle Weakness and Swelling of Extremities. Neurological Present- Headaches. Not Present- Decreased Memory, Fainting, Numbness, Seizures, Tingling, Tremor, Trouble walking and Weakness. Psychiatric Not Present- Anxiety, Bipolar, Change in Sleep Pattern, Depression, Fearful and Frequent crying. Endocrine Not Present- Cold Intolerance, Excessive Hunger, Hair Changes, Heat Intolerance, Hot flashes and New Diabetes. Hematology Not Present- Blood Thinners, Easy Bruising, Excessive bleeding, Gland problems, HIV and Persistent Infections.  Vitals  Weight: 170 lb Height: 67in Body Surface Area: 1.89 m Body Mass Index: 26.63 kg/m  Temp.:  98.52F  Pulse: 100 (Regular)  P.OX: 97% (Room air) BP: 122/78(Sitting, Left Arm, Standard)       Physical Exam  The physical exam findings are as follows:  Note: Constitutional: WDWN in NAD, conversant, no obvious deformities; Eyes: Pupils equal, round; sclera anicteric; moist conjunctiva; no lid lag HENT: Oral mucosa moist; good dentition Neck: No masses palpated, trachea midline; no thyromegaly Breasts: symmetric; no nipple retraction or discharge; bilateral fibrocystic changes; no palpable masses; no axillary lymphadenopathy Lungs: CTA bilaterally; normal respiratory effort CV: Regular rate and rhythm; no murmurs; extremities well-perfused with no edema Abd: +bowel sounds, soft, non-tender, no palpable organomegaly; no palpable hernias Musc: Normal gait; no apparent clubbing or cyanosis in extremities Lymphatic: No palpable cervical or axillary lymphadenopathy Skin: Warm, dry; no sign of jaundice Psychiatric - alert and oriented x 4; calm mood and affect    Assessment & Plan  COMPLEX SCLEROSING LESION OF LEFT BREAST (N64.89) Complex sclerosing lesion right breast  Bilateral radioactive seed localized lumpectomies.  The surgical procedure has been discussed with the patient.  Potential risks, benefits, alternative treatments, and expected outcomes have been explained.  All of the patient's questions at this time have been answered.  The likelihood of reaching the patient's treatment goal is good.  The patient understand the proposed surgical procedure and wishes to proceed.    Wilmon Arms. Corliss Skains, MD, [facility]  General/ Trauma Surgery   03/22/2020 11:20 AM

## 2020-03-22 NOTE — Anesthesia Procedure Notes (Signed)
Procedure Name: LMA Insertion Date/Time: 03/22/2020 11:36 AM Performed by: Thornell Mule, CRNA Pre-anesthesia Checklist: Patient identified, Emergency Drugs available, Suction available and Patient being monitored Patient Re-evaluated:Patient Re-evaluated prior to induction Oxygen Delivery Method: Circle system utilized Preoxygenation: Pre-oxygenation with 100% oxygen Induction Type: IV induction LMA: LMA inserted LMA Size: 4.0 Number of attempts: 1 Placement Confirmation: positive ETCO2 Tube secured with: Tape Dental Injury: Teeth and Oropharynx as per pre-operative assessment

## 2020-03-23 ENCOUNTER — Encounter (HOSPITAL_BASED_OUTPATIENT_CLINIC_OR_DEPARTMENT_OTHER): Payer: Self-pay | Admitting: Surgery

## 2020-03-24 LAB — SURGICAL PATHOLOGY

## 2020-03-29 ENCOUNTER — Telehealth: Payer: Self-pay | Admitting: Hematology and Oncology

## 2020-03-29 NOTE — Telephone Encounter (Signed)
Received a new pt referral from Dr. Harlon Flor for dx of breast cancer. Autumn Patel has been cld and scheduled to see Dr. Pamelia Hoit on 8/31 at 345pm. Pt aware to arrive 15 minutes early.

## 2020-04-03 NOTE — Progress Notes (Signed)
[facility] CONSULT NOTE  Patient Care Team: Donita Brooks, MD as PCP - General (Family Medicine)  CHIEF COMPLAINTS/PURPOSE OF CONSULTATION:  Newly diagnosed breast cancer  HISTORY OF PRESENTING ILLNESS:  Autumn Patel 59 y.o. 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 Screening mammogram on 10/11/19 showed a left breast distortion. Mammogram on 10/21/19 showed the distortion in the superior left breast. Biopsy on 11/10/19 showed a complex sclerosing lesion with usual ductal hyperplasia. Breast MRI on 01/28/20 showed a 0.6cm indeterminate mass in the upper right breast and a 1.0cm of enhancement in the upper left breast, with no abnormal lymph nodes. Biopsy on 02/17/20 showed complex sclerosing lesion with atypical lobular hyperplasia in the right breast, and fibrocystic changes in the left breast. She underwent bilateral lumpectomies on 03/22/20 with Dr. Corliss Skains for which pathology showed in the left breast, lobular carcinoma in situ, and in the right breast, lobular carcinoma in situ. She presents to the clinic today for initial evaluation and discussion of treatment options.   I reviewed her records extensively and collaborated the history with the patient.  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 Allergy   . Family history of breast cancer   . Family history of leukemia   . Hypertension     SURGICAL HISTORY: Past Surgical History:  Procedure Laterality Date  . BREAST CYST EXCISION Left   . BREAST LUMPECTOMY WITH RADIOACTIVE SEED LOCALIZATION Bilateral 03/22/2020   Procedure: BILATERAL BREAST LUMPECTOMY  WITH RADIOACTIVE SEED LOCALIZATION;  Surgeon: Manus Rudd, MD;  Location: [facility];  Service: General;  Laterality: Bilateral;  . CESAREAN SECTION    . CHOLECYSTECTOMY      SOCIAL HISTORY: Social History   Socioeconomic History  . Marital status: Married    Spouse name: Not on file  . Number of children: Not on file  . Years of education: Not on file  . Highest education level: Not on file  Occupational History  . Not on file  Tobacco Use  . Smoking status: Never Smoker  . Smokeless tobacco: Never Used  Substance and Sexual Activity  . Alcohol use: No  . Drug use: No  . Sexual activity: Yes    Comment: married  Other Topics Concern  . Not on file  Social History Narrative  . Not on file   Social Determinants of Health   Financial Resource Strain:   . Difficulty of Paying Living Expenses: Not on file  Food Insecurity:   . Worried About Programme researcher, broadcasting/film/video in the Last Year: Not on file  . Ran Out of Food in the Last Year: Not on file  Transportation Needs:   . Lack of Transportation (Medical): Not on file  . Lack of Transportation (Non-Medical): Not on file  Physical Activity:   . Days of Exercise per Week: Not on file  . Minutes of Exercise per Session: Not on file  Stress:   . Feeling of Stress : Not on file  Social Connections:   . Frequency of Communication with Friends and Family: Not on file  . Frequency of Social Gatherings with  Friends and Family: Not on file  . Attends Religious Services: Not on file  . Active Member of Clubs or Organizations: Not on file  . Attends Banker Meetings: Not on file  . Marital Status: Not on file  Intimate Partner Violence:   . Fear of Current or Ex-Partner: Not on file  . Emotionally Abused: Not on file  . Physically Abused: Not on file  . Sexually Abused: Not on file    FAMILY HISTORY: Family History  Problem Relation Age of Onset  . Hypertension Mother   . Heart disease Father   .  Hypertension Father   . Diabetes Father   . Stroke Father   . Breast cancer Sister 47       dx. again age 14; bilateral mastectomies  . Vision loss Paternal Uncle   . Breast cancer Sister 26  . Multiple sclerosis Sister   . Leukemia Cousin 13    ALLERGIES:  has No Known Allergies.  MEDICATIONS:  Current Outpatient Medications  Medication Sig Dispense Refill  . HYDROcodone-acetaminophen (NORCO/VICODIN) 5-325 MG tablet Take 1 tablet by mouth every 6 (six) hours as needed for moderate pain. 15 tablet 0   No current facility-administered medications for this visit.    REVIEW OF SYSTEMS:     All other systems were reviewed with the patient and are negative.  PHYSICAL EXAMINATION: ECOG PERFORMANCE STATUS: 1 - Symptomatic but completely ambulatory  Vitals:   04/04/20 1303  BP: (!) 157/87  Pulse: 81  Resp: 18  Temp: 97.8 F (36.6 C)  SpO2: 100%   Filed Weights   04/04/20 1303  Weight: 169 lb 14.4 oz (77.1 kg)       LABORATORY DATA:  I have reviewed the data as listed Lab Results  Component Value Date   WBC 4.8 03/13/2018   HGB 12.5 03/13/2018   HCT 37.5 03/13/2018   MCV 83.5 03/13/2018   PLT 320 03/13/2018   Lab Results  Component Value Date   NA 141 03/13/2018   K 4.4 03/13/2018   CL 107 03/13/2018   CO2 27 03/13/2018    RADIOGRAPHIC STUDIES: I have personally reviewed the radiological reports and agreed with the findings in the report.  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 LCIS is a risk factor for breast cancer She is at higher than normal risk of breast cancer. Risk of invasive and noninvasive breast cancer with LCIS is 1% per year. Her cumulative risk lifetime would be around 30% %. Tamoxifen would reduce this risk by half. This is based on NSABP P-1 clinical trial  Risk reduction strategies: 1. Tamoxifen or raloxifene are  indicated to decrease the risk of another noninvasive or invasive breast cancer. Patient fully understands that neither of these medications would prolong her life but certainly help decrease recurrence rate by 50% . 2. Recommended annual mammograms, breast MRI and breast exams.   Tamoxifen toxicities:We discussed the risks and benefits of tamoxifen. These include but not limited to insomnia, hot flashes, mood changes, vaginal dryness, and weight gain. Although rare, serious side effects including endometrial cancer, risk of blood clots were also discussed. We strongly believe that the benefits far outweigh the risks. Patient understands these risks and consented to starting treatment. Planned treatment duration is 5 years.  Return to clinic in 3 months for toxicity evaluation and follow-up through a virtual visit.  All questions were answered. The patient knows to call the clinic with any problems, questions or concerns.   Sabas Sous, MD, MPH 04/04/2020    I, Molly Dorshimer, am acting as scribe for Serena Croissant, MD.  I have reviewed the above documentation for accuracy and completeness, and I agree with the above.

## 2020-04-04 ENCOUNTER — Other Ambulatory Visit: Payer: Self-pay

## 2020-04-04 ENCOUNTER — Inpatient Hospital Stay: Payer: BC Managed Care – PPO | Attending: Hematology and Oncology | Admitting: Hematology and Oncology

## 2020-04-04 DIAGNOSIS — D0501 Lobular carcinoma in situ of right breast: Secondary | ICD-10-CM | POA: Insufficient documentation

## 2020-04-04 DIAGNOSIS — D0502 Lobular carcinoma in situ of left breast: Secondary | ICD-10-CM

## 2020-04-04 MED ORDER — TAMOXIFEN CITRATE 20 MG PO TABS: 20.0000 mg | ORAL_TABLET | Freq: Every day | ORAL | 3 refills | Status: DC

## 2020-04-04 NOTE — Assessment & Plan Note (Signed)
Pathology counseling: I discussed with her the difference between LCIS and invasive cancer. LCIS is a risk factor for breast cancer She is at higher than normal risk of breast cancer. Risk of invasive and noninvasive breast cancer with LCIS is 1% per year. Her cumulative risk lifetime would be around 30% %. Tamoxifen would reduce this risk by half. This is based on NSABP P-1 clinical trial  Risk reduction strategies: 1. Tamoxifen or raloxifene are indicated to decrease the risk of another noninvasive or invasive breast cancer. Patient fully understands that neither of these medications would prolong her life but certainly help decrease recurrence rate by 50% . 2. Recommended annual mammograms, breast MRI and breast exams.   Tamoxifen toxicities:We discussed the risks and benefits of tamoxifen. These include but not limited to insomnia, hot flashes, mood changes, vaginal dryness, and weight gain. Although rare, serious side effects including endometrial cancer, risk of blood clots were also discussed. We strongly believe that the benefits far outweigh the risks. Patient understands these risks and consented to starting treatment. Planned treatment duration is 5 years.  Return to clinic in 3 months for toxicity evaluation and follow-up.

## 2020-04-06 ENCOUNTER — Telehealth: Payer: Self-pay | Admitting: Hematology and Oncology

## 2020-04-06 NOTE — Telephone Encounter (Signed)
Scheduled per 8/31 los. Called and spoke with pt, confirmed 11/30 appt

## 2020-06-23 ENCOUNTER — Ambulatory Visit
Admission: RE | Admit: 2020-06-23 | Discharge: 2020-06-23 | Disposition: A | Discharge status: Home / Self Care | Payer: BC Managed Care – PPO | Source: Ambulatory Visit | Attending: Family Medicine | Admitting: Family Medicine

## 2020-06-23 ENCOUNTER — Encounter: Payer: Self-pay | Admitting: Family Medicine

## 2020-06-23 ENCOUNTER — Ambulatory Visit: Payer: BC Managed Care – PPO | Admitting: Family Medicine

## 2020-06-23 ENCOUNTER — Other Ambulatory Visit: Payer: Self-pay

## 2020-06-23 VITALS — BP 130/76 | HR 80 | Temp 97.8°F | Resp 16 | Ht 67.0 in | Wt 175.0 lb

## 2020-06-23 DIAGNOSIS — M545 Low back pain, unspecified: Secondary | ICD-10-CM | POA: Diagnosis not present

## 2020-06-23 DIAGNOSIS — G8929 Other chronic pain: Secondary | ICD-10-CM

## 2020-06-23 IMAGING — DX DG LUMBAR SPINE COMPLETE 4+V
5 series · 5 of 5 positions shown · non-contrast
Comparison: None.

CLINICAL DATA: Chronic back pain with recent increase, initial
encounter

EXAM:
LUMBAR SPINE - COMPLETE 4+ VIEW

[dg lumbar spine complete 4 +v (1 of 5)]
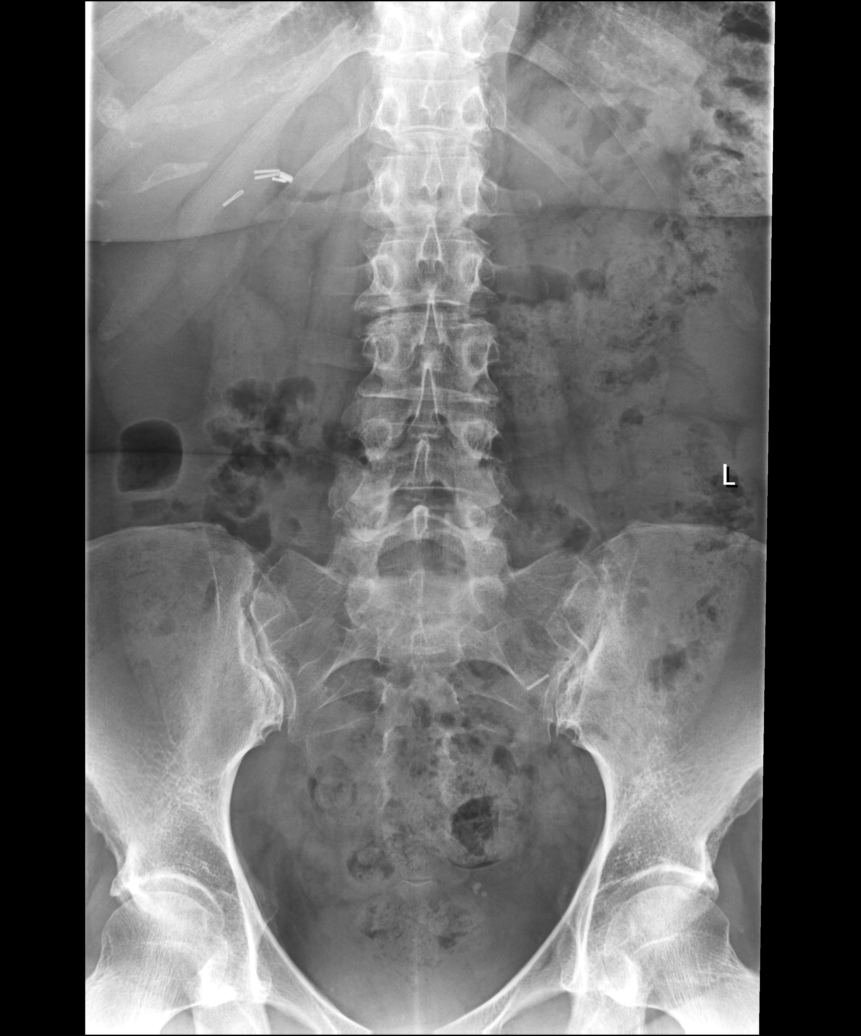

[dg lumbar spine complete 4 +v (2 of 5)]
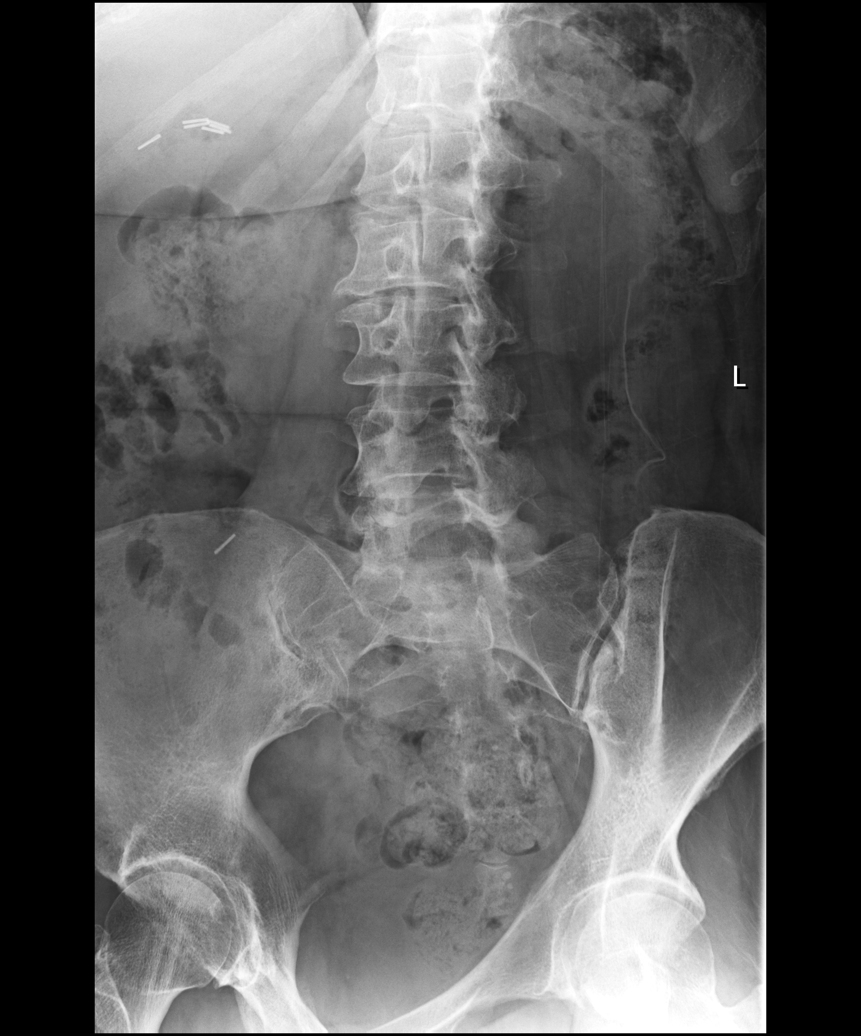

[dg lumbar spine complete 4 +v (3 of 5)]
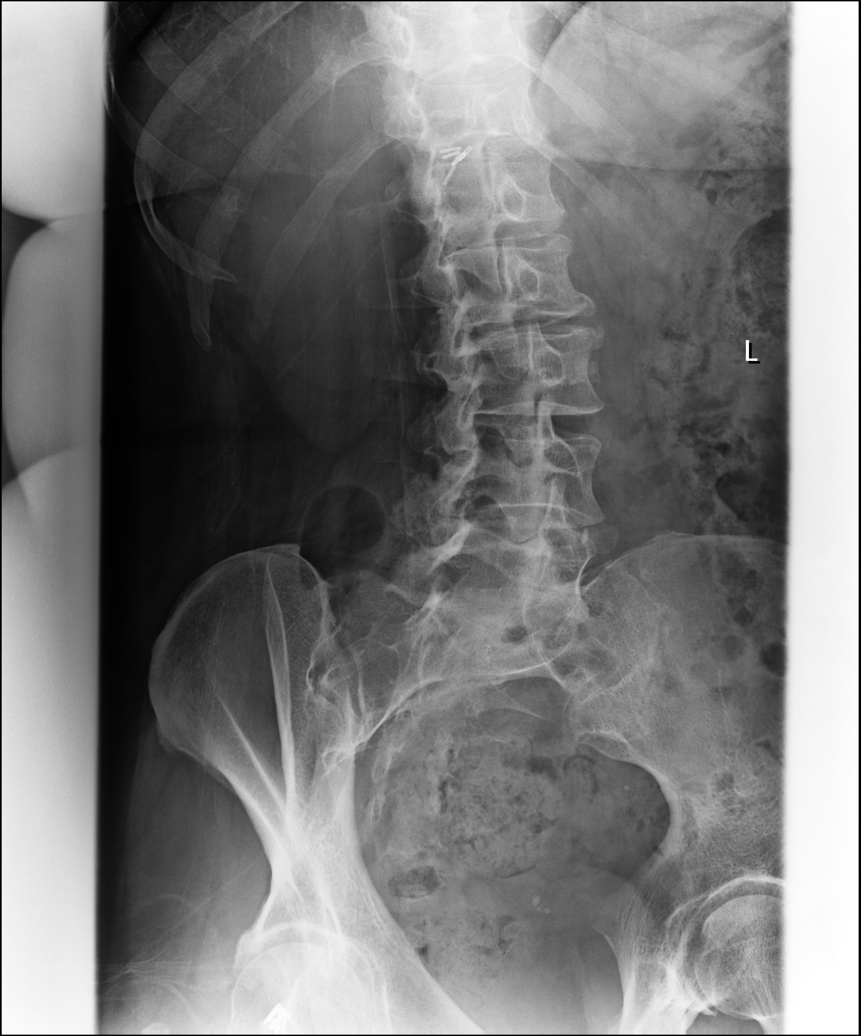

[dg lumbar spine complete 4 +v (4 of 5)]
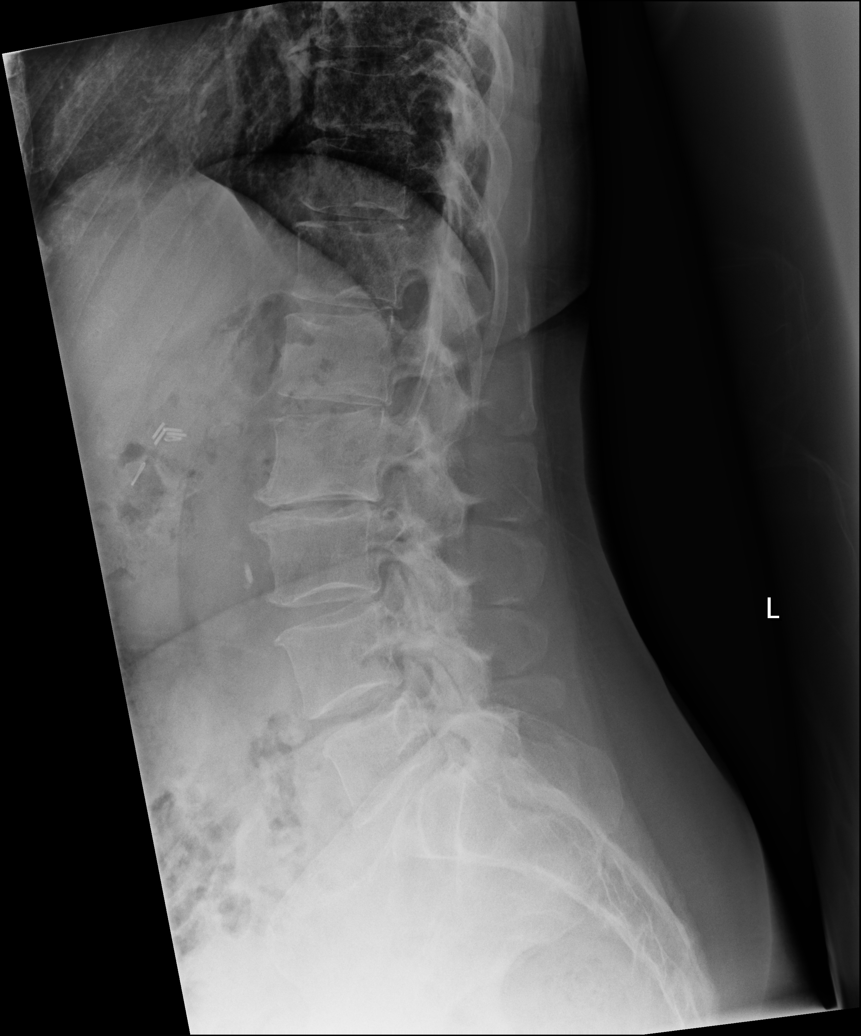

[dg lumbar spine complete 4 +v (5 of 5)]
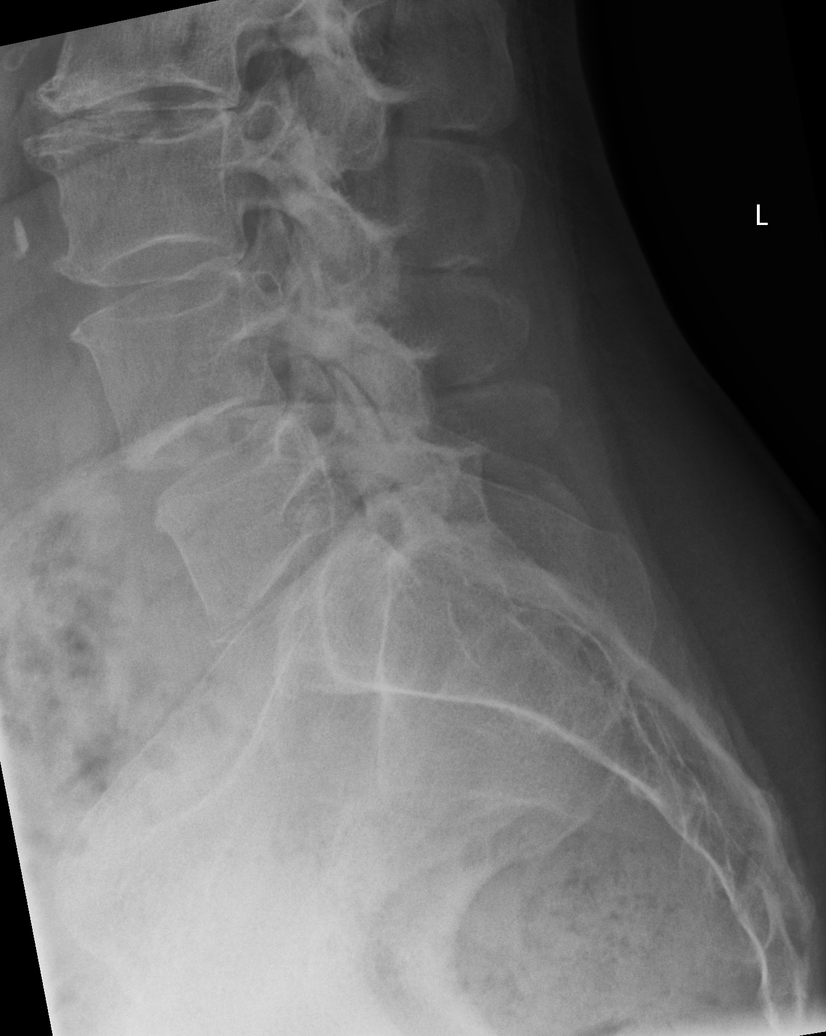

[5 of 5 positions shown; findings below may reference images not displayed]

FINDINGS: Five lumbar type vertebral bodies are well visualized. Vertebral
body height is well maintained. No pars defects are noted. Mild
facet hypertrophic changes are noted without significant
anterolisthesis. Osteophytic changes are seen as well as disc space
narrowing primarily at L2-3. Retained fecal material is noted within
the colon consistent with a mild degree of colonic constipation.
Changes of prior cholecystectomy are noted.
IMPRESSION: Multilevel degenerative change without acute abnormality.

## 2020-06-23 MED ORDER — HYDROCODONE-ACETAMINOPHEN 5-325 MG PO TABS: 1.0000 | ORAL_TABLET | Freq: Four times a day (QID) | ORAL | 0 refills | Status: DC | PRN

## 2020-06-23 MED ORDER — MELOXICAM 7.5 MG PO TABS: 7.5000 mg | ORAL_TABLET | Freq: Every day | ORAL | 0 refills | Status: DC

## 2020-06-23 MED ORDER — CYCLOBENZAPRINE HCL 10 MG PO TABS: 10.0000 mg | ORAL_TABLET | Freq: Three times a day (TID) | ORAL | 0 refills | Status: DC | PRN

## 2020-06-23 NOTE — Patient Instructions (Addendum)
Try Tumeric 400mg  once a day  Xray - [facility]  [address] [place] [address]  Take meloxicam once a day with food Use muscle relaxer and pain med as needed

## 2020-06-23 NOTE — Progress Notes (Signed)
° °  Subjective:    Patient ID: Autumn Patel, female    DOB: [birth year], 59 y.o.   MRN: [id number]  Patient presents for Lower Back Pain (x4 days- low back pain worsens with activity)   Pt here with lower back pain for the past 4-5 days. No particular injury, or falls  She has been taking tylenol , aleve with minimal improvement  When she moves around it catches No tinglying numbness in toes  No change in bowel or bladder She has chronic back pain for many years, but never had it worked up, oftne just self treats at home, but this has not improved   She has history of breast cancer  Review Of Systems:  GEN- denies fatigue, fever, weight loss,weakness, recent illness HEENT- denies eye drainage, change in vision, nasal discharge, CVS- denies chest pain, palpitations RESP- denies SOB, cough, wheeze ABD- denies N/V, change in stools, abd pain GU- denies dysuria, hematuria, dribbling, incontinence MSK- +joint pain, muscle aches, injury Neuro- denies headache, dizziness, syncope, seizure activity  [facility] [place]      Objective:    BP 130/76    Pulse 80    Temp 97.8 F (36.6 C) (Temporal)    Resp 16    Ht 5\' 7"  (1.702 m)    Wt 175 lb (79.4 kg)    LMP  (LMP Unknown) Comment: LMP > 1 year   SpO2 97%    BMI 27.41 kg/m  GEN- NAD, alert and oriented x3 CVS- RRR, no murmur RESP-CTAB ABD-NABS,soft,NT,ND MSK TTP lower lumbar/sacrum area, fair ROM, neg SLR, good ROM hips/knees NEURO- normal tone LE, strength equal bilat, sensation in tact LE  Antalgic gait  EXT- No edema Pulses- Radial, DP- 2+        Assessment & Plan:      Problem List Items Addressed This Visit    None    Visit Diagnoses    Chronic bilateral low back pain without sciatica    -  Primary   acute on chronic back pain but also with history of breast cance,r suspect some DD on imaging, will obtain xray, given muscle relaxer, mobic, norco prn   Relevant Medications   meloxicam (MOBIC) 7.5 MG tablet    cyclobenzaprine (FLEXERIL) 10 MG tablet   HYDROcodone-acetaminophen (NORCO) 5-325 MG tablet   Other Relevant Orders   DG Lumbar Spine Complete      Note: This dictation was prepared with Dragon dictation along with smaller phrase technology. Any transcriptional errors that result from this process are unintentional.

## 2020-06-24 ENCOUNTER — Encounter: Payer: Self-pay | Admitting: Family Medicine

## 2020-06-27 ENCOUNTER — Telehealth: Payer: Self-pay

## 2020-06-27 NOTE — Telephone Encounter (Signed)
Patient called office back for lab results (xray), Patient verbalized understanding of xray results given.

## 2020-07-03 NOTE — Assessment & Plan Note (Signed)
Risk of invasive and noninvasive breast cancer with LCIS is 1% per year. Her cumulative risk lifetime would be around 30% %. Tamoxifen would reduce this risk by half. This is based on NSABP P-1 clinical trial  Risk reduction strategies: 1. Tamoxifen or raloxifene are indicated to decrease the risk of another noninvasive or invasive breast cancer. Patient fully understands that neither of these medications would prolong her life but certainly help decrease recurrence rate by 50% . 2. Recommended annual mammograms, breast MRI and breast exams.   Tamoxifen toxicities:

## 2020-07-03 NOTE — Progress Notes (Signed)
HEMATOLOGY-ONCOLOGY MYCHART VIDEO VISIT PROGRESS NOTE  I connected with Autumn Patel on 07/04/2020 at  3:45 PM EST by MyChart video conference and verified that I am speaking with the correct person using two identifiers.  I discussed the limitations, risks, security and privacy concerns of performing an evaluation and management service by MyChart and the availability of in person appointments.  I also discussed with the patient that there may be a patient responsible charge related to this service. The patient expressed understanding and agreed to proceed.  Patient's Location: Home Physician Location: Clinic  CHIEF COMPLIANT: Follow-up of LCIS on tamoxifen  INTERVAL HISTORY: Autumn Patel is a 59 y.o. female with above-mentioned history of LCIS of the bilateral breasts who underwent bilateral lumpectomies and is currently on antiestrogen therapy with tamoxifen. She presents over MyChart today for follow-up.  Apart from mild to moderate hot flashes she is tolerating tamoxifen extremely well.  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 There is no height or weight on file to calculate BMI.  I have reviewed the data as listed CMP Latest Ref Rng & Units 03/13/2018 09/30/2016 12/05/2015  Glucose 65 - 99 mg/dL 83 90 361(W)  BUN 7 - 25 mg/dL 16 14 12   Creatinine 0.50 - 1.05 mg/dL 4.31 5.40  Sodium 135 - 146 mmol/L 141 141 135  Potassium 3.5 - 5.3 mmol/L 4.4 3.9 3.7  Chloride 98 - 110 mmol/L 107 105 100  CO2 20 - 32 mmol/L 27 29 26   Calcium 8.6 - 10.4 mg/dL 9.5 9.6 8.7  Total Protein 6.1 - 8.1 g/dL 7.0 6.7 6.6  Total Bilirubin 0.2 - 1.2 mg/dL 0.5 0.4 0.3  Alkaline Phos 33 - 130 U/L - 72 64  AST 10  - 35 U/L 16 15 20   ALT 6 - 29 U/L 17 13 13     Lab Results  Component Value Date   WBC 4.8 03/13/2018   HGB 12.5 03/13/2018   HCT 37.5 03/13/2018   MCV 83.5 03/13/2018   PLT 320 03/13/2018   NEUTROABS 2,587 03/13/2018      Assessment Plan:  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 Her cumulative risk lifetime would be around 30% %. Tamoxifen would reduce this risk by half. This is based on NSABP P-1 clinical trial  Risk reduction strategies: 1. Tamoxifen 20 mg daily. 2. Recommended annual mammograms, breast MRI and breast exams. I will order a mammogram to be done in March 2022 and a breast MRI to be done in September 2022.  Tamoxifen toxicities: 1. Hot flashes: Mild to moderate Return to clinic in 1 year for follow-up  I discussed the assessment and treatment plan with the patient. The patient was provided an opportunity to ask questions and all were answered. The patient agreed with the plan and demonstrated an understanding of the instructions. The patient was advised to call back or seek an in-person evaluation if the symptoms worsen or if the condition fails to improve as anticipated.   I provided 20 minutes of face-to-face MyChart video visit time during this encounter.    05/13/2018, MD 07/04/2020  I, Molly Dorshimer, am acting as scribe for Nicholas Lose, MD.  I have reviewed the above documentation for accuracy and completeness, and I agree with the above.

## 2020-07-04 ENCOUNTER — Inpatient Hospital Stay: Payer: BC Managed Care – PPO | Attending: Hematology and Oncology | Admitting: Hematology and Oncology

## 2020-07-04 DIAGNOSIS — D0501 Lobular carcinoma in situ of right breast: Secondary | ICD-10-CM | POA: Diagnosis not present

## 2020-07-04 DIAGNOSIS — D0502 Lobular carcinoma in situ of left breast: Secondary | ICD-10-CM | POA: Diagnosis not present

## 2020-07-07 ENCOUNTER — Encounter: Payer: Self-pay | Admitting: *Deleted

## 2020-07-23 ENCOUNTER — Other Ambulatory Visit: Payer: Self-pay | Admitting: Family Medicine

## 2020-08-10 ENCOUNTER — Ambulatory Visit: Payer: BC Managed Care – PPO | Admitting: Nurse Practitioner

## 2020-08-15 ENCOUNTER — Other Ambulatory Visit: Payer: Self-pay

## 2020-08-15 ENCOUNTER — Telehealth (INDEPENDENT_AMBULATORY_CARE_PROVIDER_SITE_OTHER): Payer: Self-pay | Admitting: Family Medicine

## 2020-08-15 ENCOUNTER — Other Ambulatory Visit: Payer: Self-pay | Admitting: Family Medicine

## 2020-08-15 DIAGNOSIS — U071 COVID-19: Secondary | ICD-10-CM

## 2020-08-15 MED ORDER — HYDROCODONE-HOMATROPINE 5-1.5 MG/5ML PO SYRP
5.0000 mL | ORAL_SOLUTION | Freq: Three times a day (TID) | ORAL | 0 refills | Status: DC | PRN
Start: 2020-08-15 — End: 2021-06-07

## 2020-08-15 NOTE — Progress Notes (Signed)
Subjective:    Patient ID: Autumn Patel, female    DOB: 02-12-1961, 60 y.o.   MRN: [id number]  HPI Patient is being seen today as a telephone visit.  Patient is currently at home.  I am currently in my office.  Phone call began at 1205.  Phone call concluded at 1215.  Patient tested positive for COVID with a home test on Sunday.  She reports runny nose.  She denies any fever.  She denies any cough.  She denies any chest pain or shortness of breath.  However she does report sinus pressure and sinus drainage.  She has had 2 doses of her COVID-vaccine although she has not had her COVID booster.  She denies any nausea or vomiting.  She describes it as a bad cold.  However she needs a PCR test for work in order to be able to stay at home and quarantine. Past Medical History:  Diagnosis Date  . Allergy   . Family history of breast cancer   . Family history of leukemia   . Hypertension    Past Surgical History:  Procedure Laterality Date  . BREAST CYST EXCISION Left   . BREAST LUMPECTOMY WITH RADIOACTIVE SEED LOCALIZATION Bilateral 03/22/2020   Procedure: BILATERAL BREAST LUMPECTOMY WITH RADIOACTIVE SEED LOCALIZATION;  Surgeon: Manus Rudd, MD;  Location: [facility];  Service: General;  Laterality: Bilateral;  . CESAREAN SECTION    . CHOLECYSTECTOMY     Current Outpatient Medications on File Prior to Visit  Medication Sig Dispense Refill  . cyclobenzaprine (FLEXERIL) 10 MG tablet Take 1 tablet (10 mg total) by mouth 3 (three) times daily as needed for muscle spasms. 30 tablet 0  . HYDROcodone-acetaminophen (NORCO) 5-325 MG tablet Take 1 tablet by mouth every 6 (six) hours as needed for moderate pain. 20 tablet 0  . meloxicam (MOBIC) 7.5 MG tablet TAKE 1 TABLET BY MOUTH EVERY DAY 30 tablet 0  . tamoxifen (NOLVADEX) 20 MG tablet Take 1 tablet (20 mg total) by mouth daily. 90 tablet 3  . VITAMIN D PO Take by mouth.     No current facility-administered medications on file prior  to visit.   No Known Allergies Social History   Socioeconomic History  . Marital status: Married    Spouse name: Not on file  . Number of children: Not on file  . Years of education: Not on file  . Highest education level: Not on file  Occupational History  . Not on file  Tobacco Use  . Smoking status: Never Smoker  . Smokeless tobacco: Never Used  Substance and Sexual Activity  . Alcohol use: No  . Drug use: No  . Sexual activity: Yes    Comment: married  Other Topics Concern  . Not on file  Social History Narrative  . Not on file   Social Determinants of Health   Financial Resource Strain: Not on file  Food Insecurity: Not on file  Transportation Needs: Not on file  Physical Activity: Not on file  Stress: Not on file  Social Connections: Not on file  Intimate Partner Violence: Not on file      Review of Systems  All other systems reviewed and are negative.      Objective:   Physical Exam        Assessment & Plan:  COVID-19  Patient can come by the office and I will perform COVID testing in the parking lot.  I recommended quarantining for 10 days  if possible recommended supportive care and symptomatic therapy otherwise as this seems to be mild infection for this patient thankfully

## 2020-08-16 ENCOUNTER — Telehealth: Payer: Self-pay | Admitting: *Deleted

## 2020-08-16 MED ORDER — HYDROCOD POLST-CPM POLST ER 10-8 MG/5ML PO SUER: 5.0000 mL | Freq: Two times a day (BID) | ORAL | 0 refills | Status: DC | PRN

## 2020-08-16 NOTE — Telephone Encounter (Signed)
Patient made aware different rx sent to pharmacy

## 2020-08-16 NOTE — Telephone Encounter (Signed)
Changed to TUssionex Let pt know

## 2020-08-16 NOTE — Telephone Encounter (Signed)
Received fax requesting alternative to Hycodan. Medication is on backorder.   Please advise.

## 2020-08-17 LAB — SARS-COV-2 RNA,(COVID-19) QUALITATIVE NAAT: SARS CoV2 RNA: DETECTED — AB

## 2020-08-18 ENCOUNTER — Telehealth: Payer: Self-pay

## 2020-08-18 ENCOUNTER — Encounter: Payer: Self-pay | Admitting: *Deleted

## 2020-08-18 ENCOUNTER — Other Ambulatory Visit: Payer: Self-pay | Admitting: Family Medicine

## 2020-08-18 MED ORDER — HYDROCOD POLST-CPM POLST ER 10-8 MG/5ML PO SUER
5.0000 mL | Freq: Two times a day (BID) | ORAL | 0 refills | Status: DC | PRN
Start: 2020-08-18 — End: 2021-06-07

## 2020-10-03 NOTE — Telephone Encounter (Signed)
No action needed at this time, closing open encounter 

## 2020-10-18 ENCOUNTER — Ambulatory Visit
Admission: RE | Admit: 2020-10-18 | Discharge: 2020-10-18 | Disposition: A | Discharge status: Home / Self Care | Payer: BC Managed Care – PPO | Source: Ambulatory Visit | Attending: Hematology and Oncology | Admitting: Hematology and Oncology

## 2020-10-18 ENCOUNTER — Other Ambulatory Visit: Payer: Self-pay

## 2020-10-18 DIAGNOSIS — D0501 Lobular carcinoma in situ of right breast: Secondary | ICD-10-CM

## 2020-10-18 IMAGING — MG DIGITAL DIAGNOSTIC BILAT W/ TOMO W/ CAD
8 series · 8 of 24 positions shown · non-contrast
Comparison: Previous exam(s).

CLINICAL DATA: 59-year-old female who underwent bilateral
lumpectomies positive for LCIS. The patient's physician would like
the patient to have alternating mammograms and MRIs every 6 months
because of her high risk status. The patient cumulative life time
risk is 30%.

EXAM:
DIGITAL DIAGNOSTIC BILATERAL MAMMOGRAM WITH TOMOSYNTHESIS AND CAD
TECHNIQUE: Bilateral digital diagnostic mammography and breast tomosynthesis
was performed. The images were evaluated with computer-aided
detection.

[L CC synth-2D]
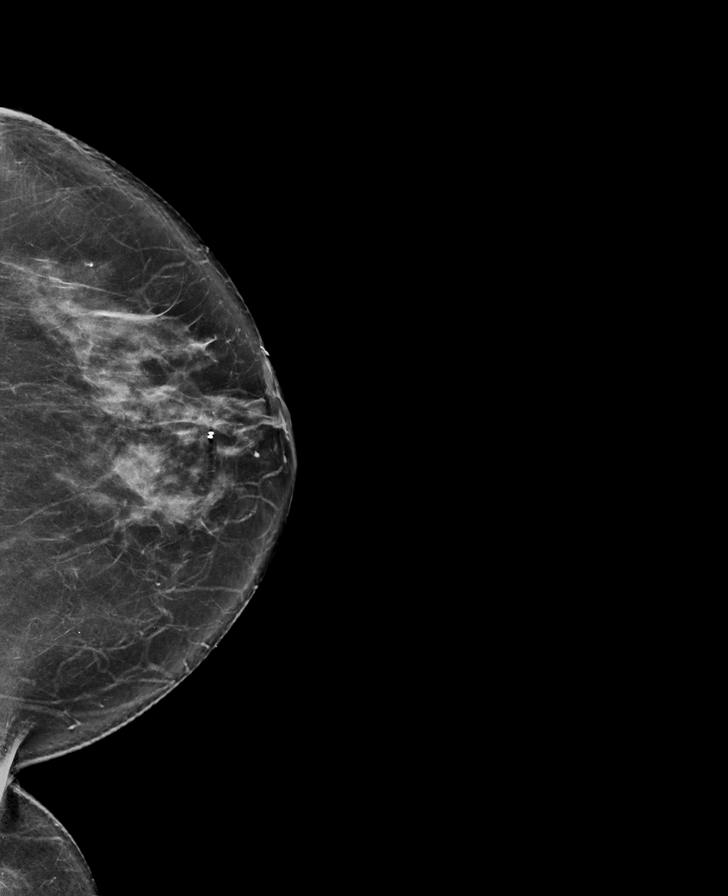

[L MLO synth-2D]
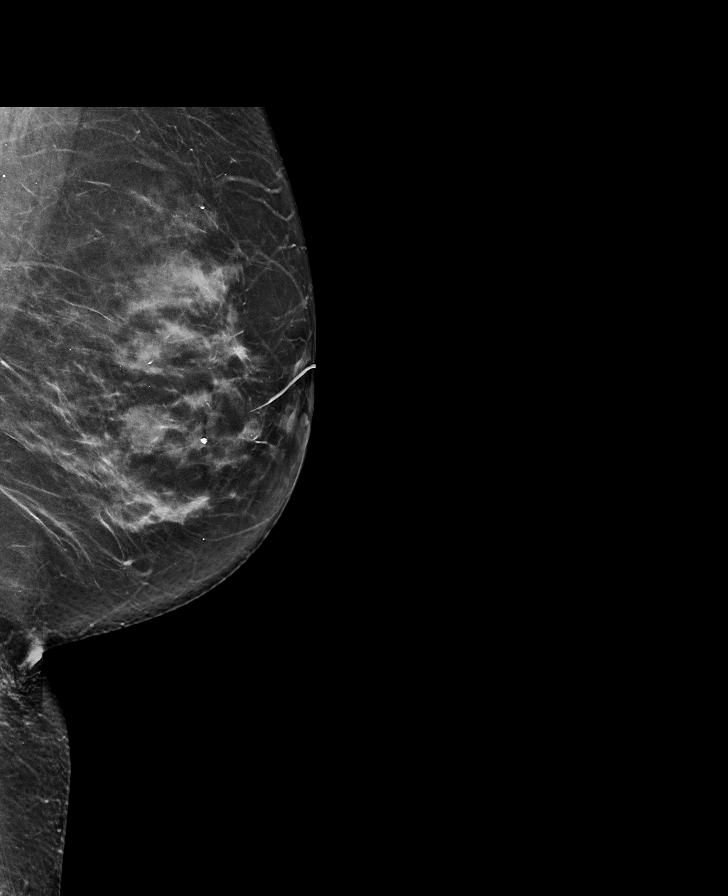

[R MLO synth-2D]
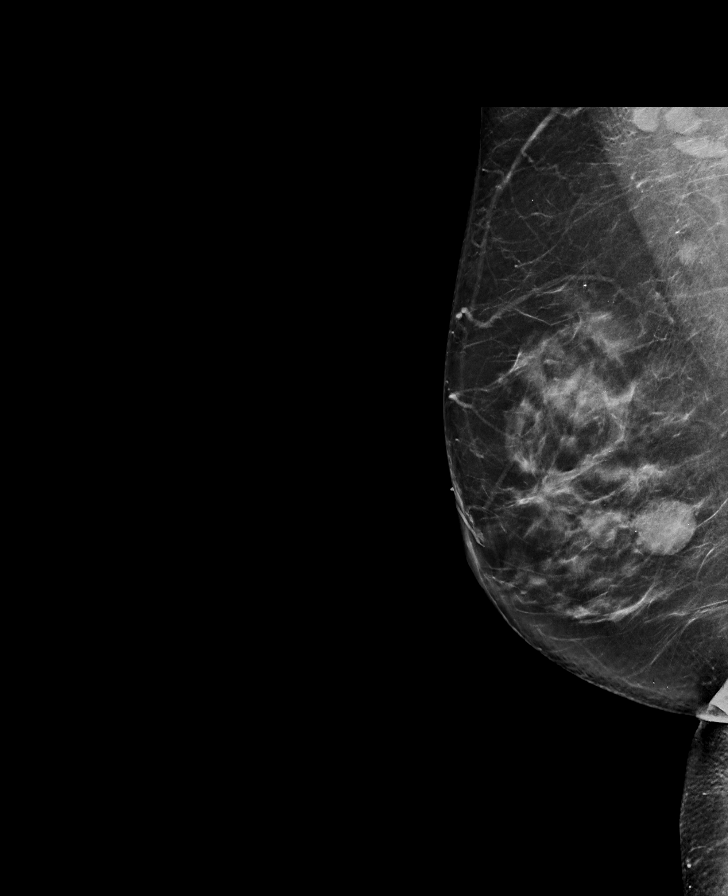

[R CC synth-2D]
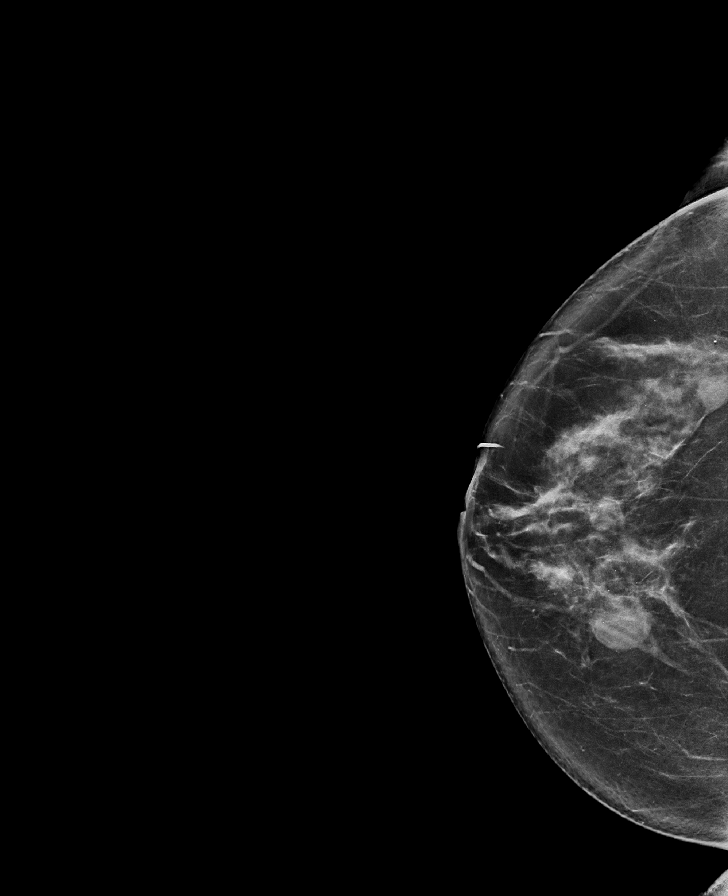

[R CC tomo · tomo slice 42/83.0]
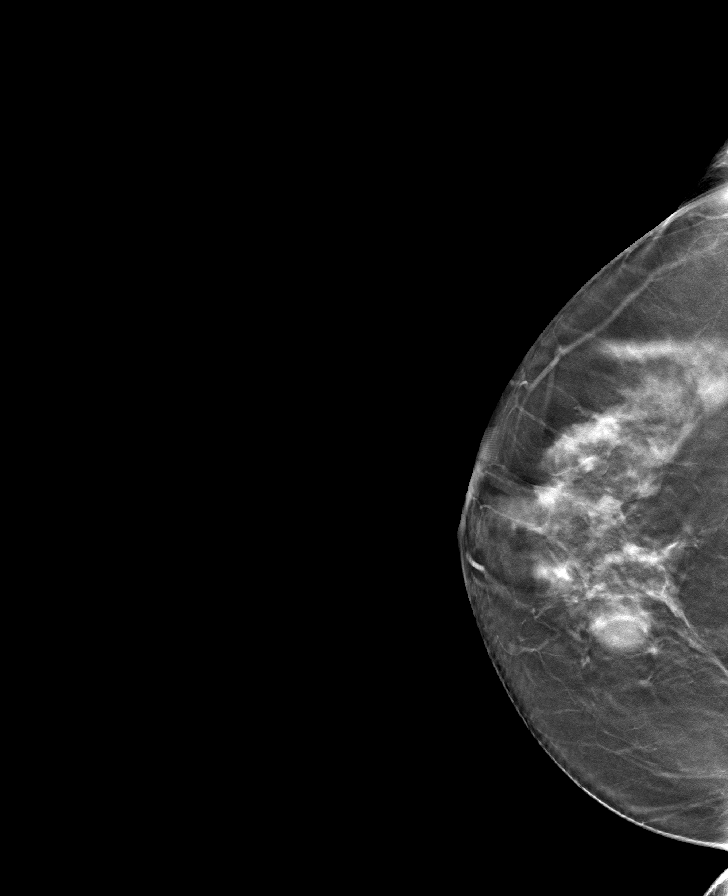

[L CC tomo · tomo slice 41/81.0]
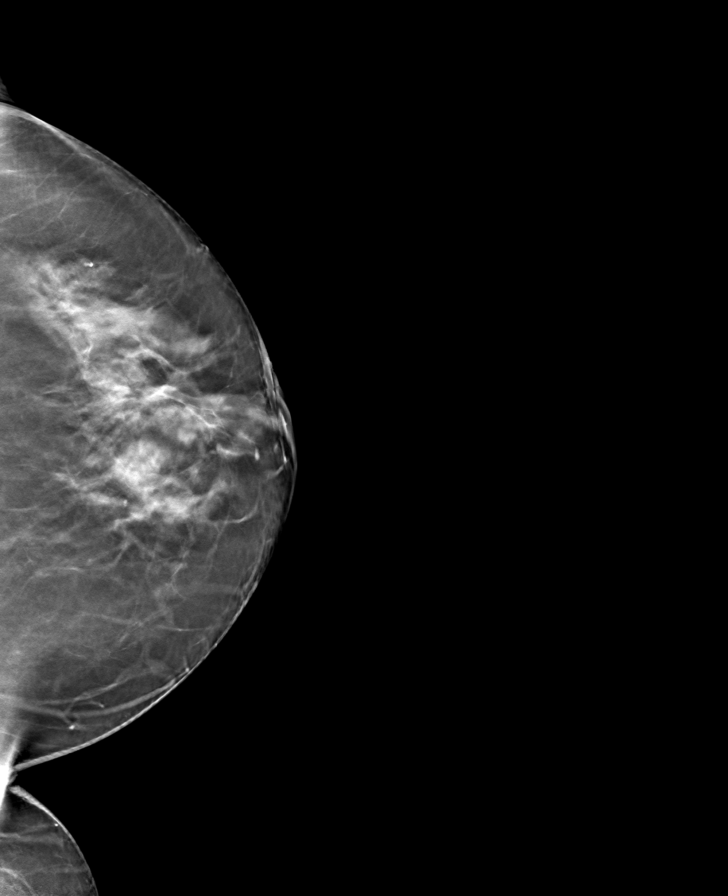

[L MLO tomo · tomo slice 37/72.0]
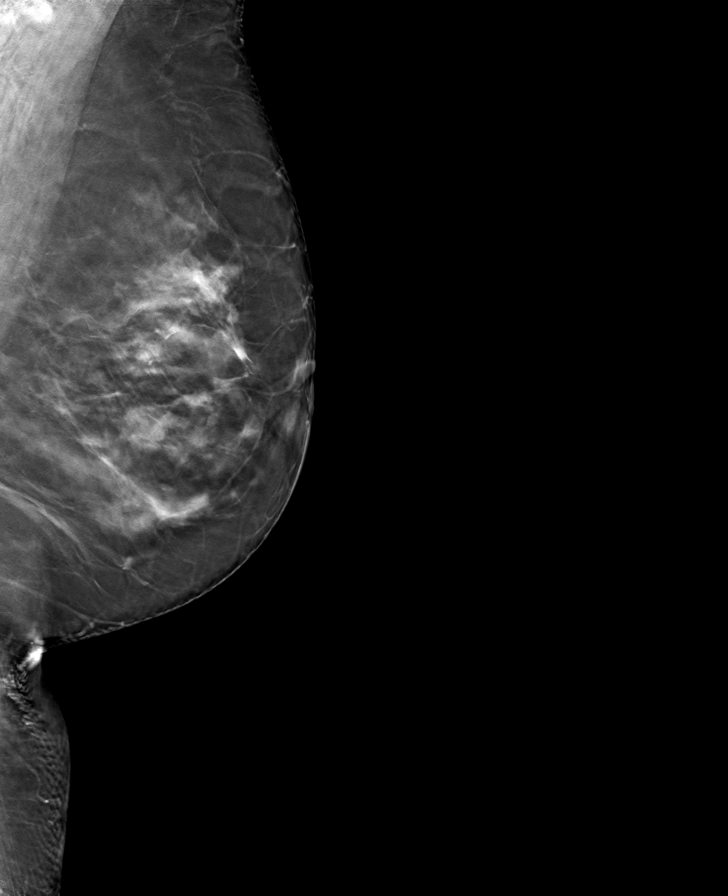

[R MLO tomo · tomo slice 39/77.0]
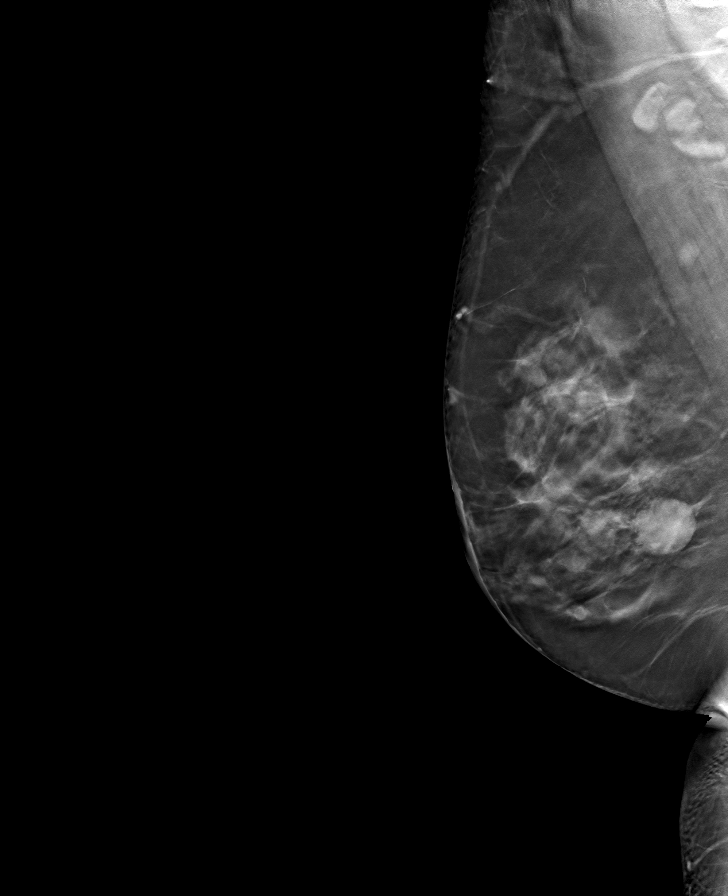

[8 of 24 positions shown; findings below may reference images not displayed]

ACR Breast Density Category c: The breast tissue is heterogeneously
dense, which may obscure small masses.
FINDINGS: Lumpectomy changes are seen in both breast. No suspicious mass or
malignant type microcalcifications identified in either breast.
IMPRESSION: No evidence of malignancy in either breast.

RECOMMENDATION:
Bilateral screening mammogram in 1 year is recommended. Breast MRI
in 6 months is recommended.

The American Cancer Society recommends annual MRI and mammography in
patients with an estimated lifetime risk of developing breast cancer
greater than 20 - 25%, or who are known or suspected to be positive
for the breast cancer gene.

I have discussed the findings and recommendations with the patient.
If applicable, a reminder letter will be sent to the patient
regarding the next appointment.

BI-RADS CATEGORY  2: Benign.

## 2021-05-17 ENCOUNTER — Other Ambulatory Visit: Payer: BC Managed Care – PPO

## 2021-05-19 ENCOUNTER — Other Ambulatory Visit: Payer: BC Managed Care – PPO

## 2021-05-21 ENCOUNTER — Other Ambulatory Visit: Payer: Self-pay | Admitting: Hematology and Oncology

## 2021-05-21 ENCOUNTER — Other Ambulatory Visit: Payer: Self-pay | Admitting: *Deleted

## 2021-05-21 MED ORDER — TAMOXIFEN CITRATE 20 MG PO TABS
20.0000 mg | ORAL_TABLET | Freq: Every day | ORAL | 0 refills | Status: DC
Start: 2021-05-21 — End: 2021-08-17

## 2021-06-02 ENCOUNTER — Other Ambulatory Visit: Payer: Self-pay

## 2021-06-02 ENCOUNTER — Ambulatory Visit
Admission: RE | Admit: 2021-06-02 | Discharge: 2021-06-02 | Disposition: A | Discharge status: Home / Self Care | Payer: BC Managed Care – PPO | Source: Ambulatory Visit | Attending: Hematology and Oncology | Admitting: Hematology and Oncology

## 2021-06-02 DIAGNOSIS — D0501 Lobular carcinoma in situ of right breast: Secondary | ICD-10-CM

## 2021-06-02 IMAGING — MR MR BREAST BILAT WO/W CM
11 of 14 series · 33 of 48 positions shown · IV contrast (7ml gadavist)
Comparison: Previous exam(s).

CLINICAL DATA: 60-year-old female presents for high risk screening
MRI. History of bilateral lumpectomies for complex sclerosing
lesions with lobular carcinoma in-situ seen at the time of excision.

LABS:  None obtained at the time of imaging.
EXAM:
BILATERAL BREAST MRI WITH AND WITHOUT CONTRAST
TECHNIQUE: Multiplanar, multisequence MR images of both breasts were obtained
prior to and following the intravenous administration of 7 ml of
Gadavist

[Series 2: t2_tirm_tra ipat (a-p) · axial · 3.0mm · 0.70mm/px · 1 of 55 slices shown]
[im 1/55]
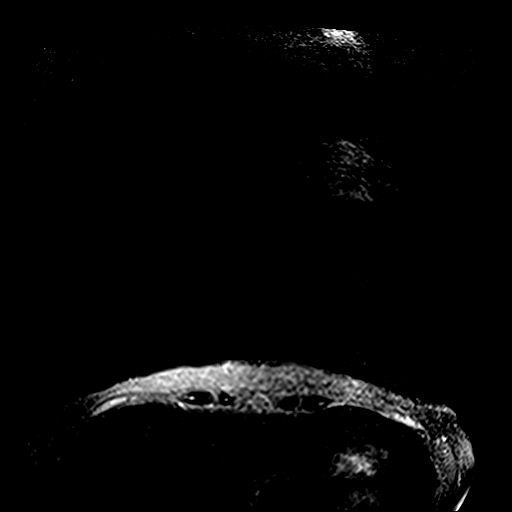

[Series 3: fl3d pre-cm no · axial · non-contrast · 1.2mm · 0.89mm/px · z∈[-131,+41]mm · 3 of 144 slices shown]
[im 1/144]
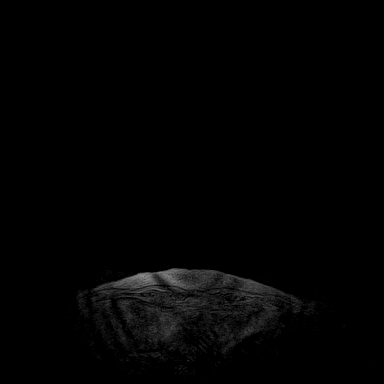
[im 72/144]
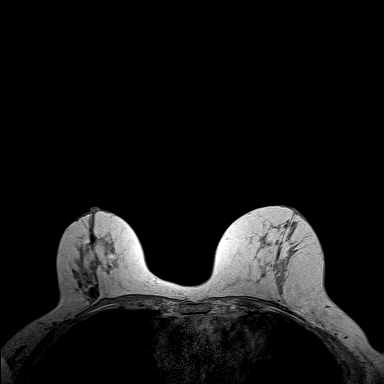
[im 144/144]
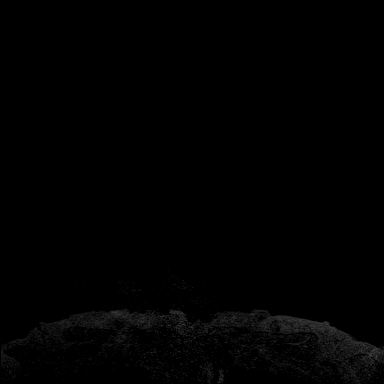

[Series 4: fl3d pre-cm · axial · non-contrast · 1.2mm · 0.89mm/px · z∈[-131,+41]mm · 3 of 144 slices shown]
[im 1/144]
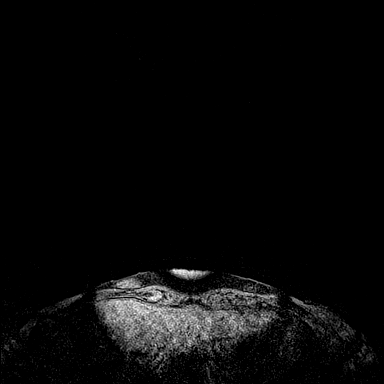
[im 72/144]
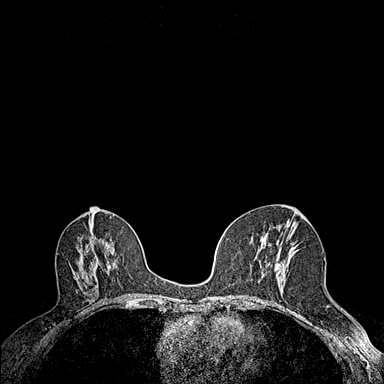
[im 144/144]
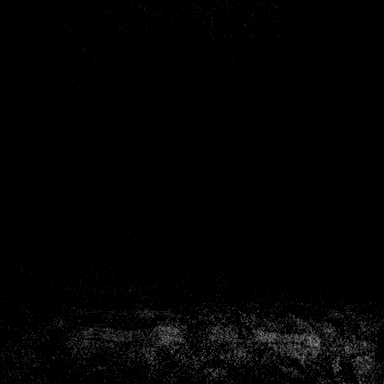

[Series 5: fl3d post-cm 20 · axial · 1.2mm · 0.89mm/px · z∈[-131,+41]mm · 4 of 144 slices shown (1 of 3)]
[im 1/144]
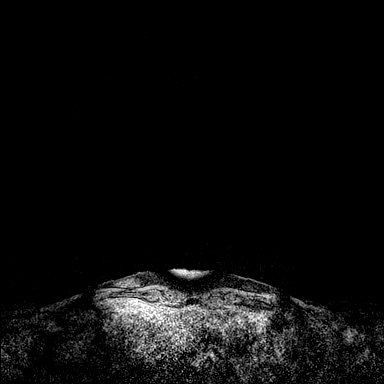
[im 48/144]
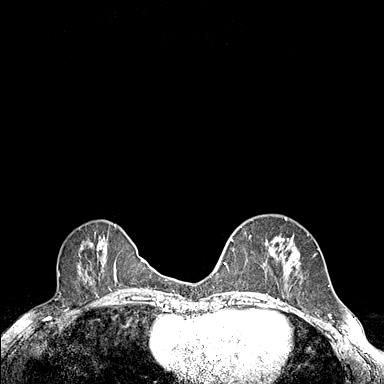
[im 96/144]
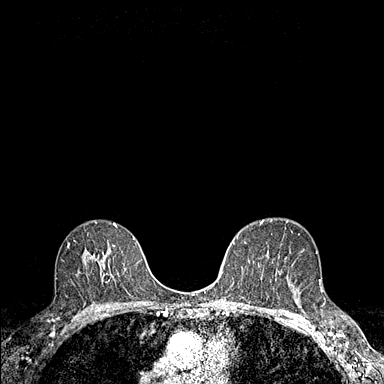
[im 144/144]
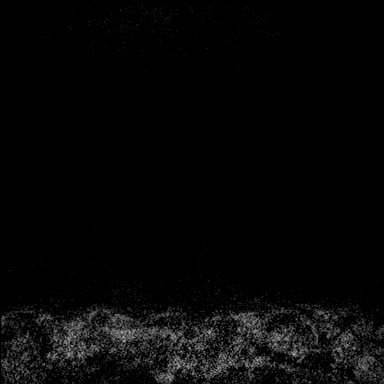

[Series 6: fl3d post-cm 20 · axial · 1.2mm · 0.89mm/px · z∈[-131,+41]mm · 4 of 144 slices shown (2 of 3)]
[im 1/144]
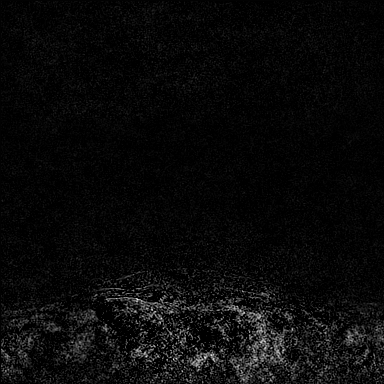
[im 48/144]
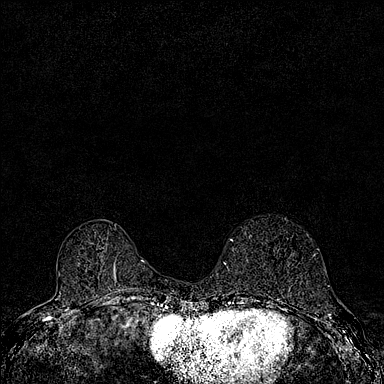
[im 96/144]
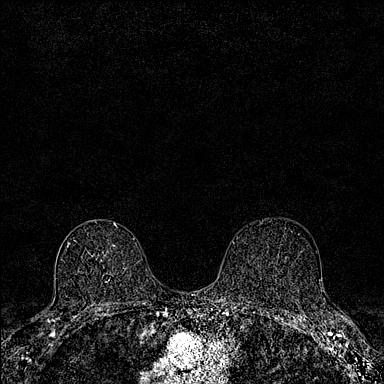
[im 144/144]
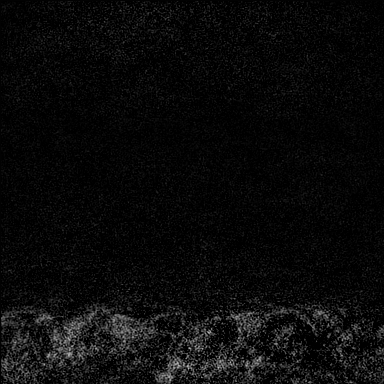

[Series 7: fl3d post-cm 20 · axial · 172.8mm · 0.89mm/px · 1 of 1 slices shown (3 of 3)]
[im 1/1]
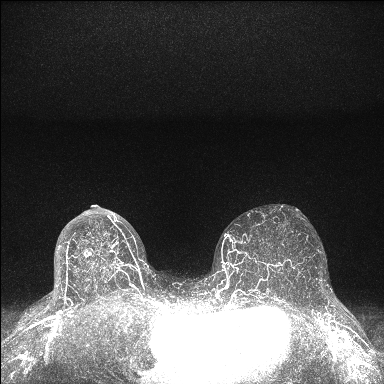

[Series 8: fl3d post-cm 3 · axial · 1.2mm · 0.89mm/px · z∈[-131,+41]mm · 4 of 144 slices shown (1 of 3)]
[im 1/144]
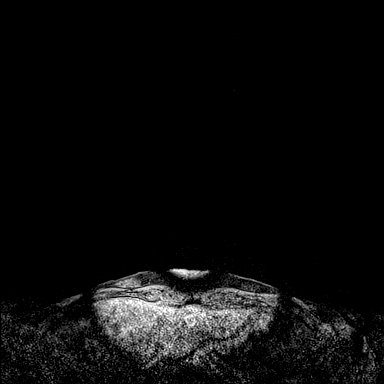
[im 48/144]
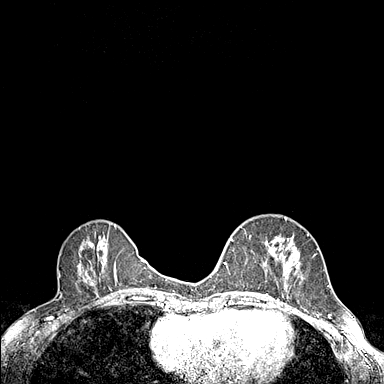
[im 96/144]
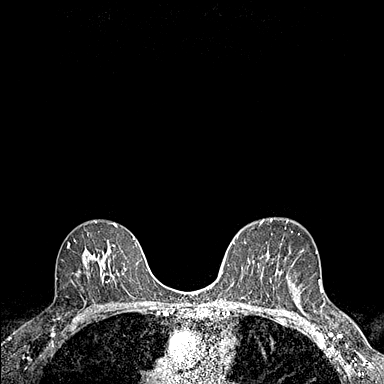
[im 144/144]
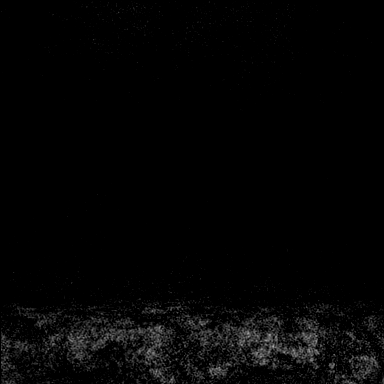

[Series 9: fl3d post-cm 3 · axial · 1.2mm · 0.89mm/px · z∈[-131,+41]mm · 4 of 144 slices shown (2 of 3)]
[im 1/144]
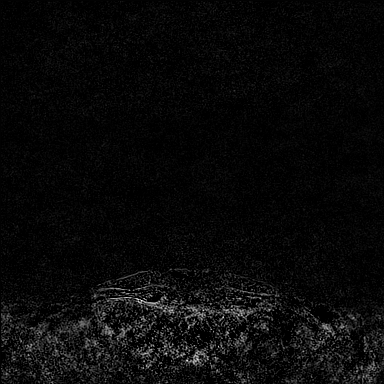
[im 48/144]
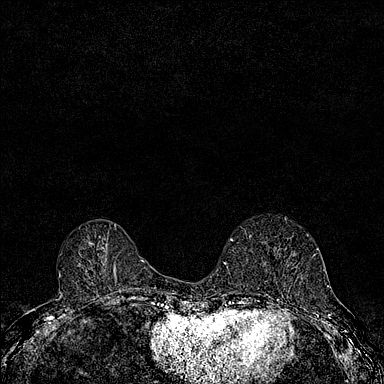
[im 96/144]
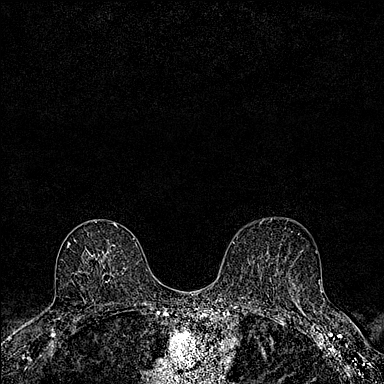
[im 144/144]
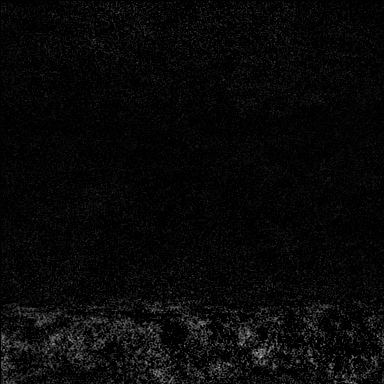

[Series 10: fl3d post-cm 3 · axial · 172.8mm · 0.89mm/px · 1 of 1 slices shown (3 of 3)]
[im 1/1]
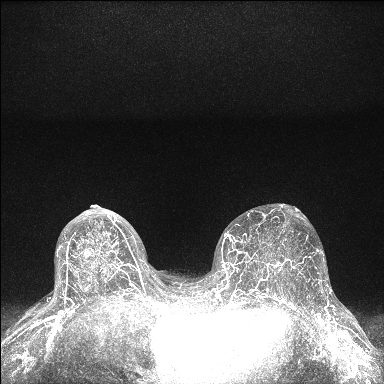

[Series 11: fl3d post-cm 5 · axial · 1.2mm · 0.89mm/px · z∈[-131,+41]mm · 4 of 144 slices shown (1 of 2)]
[im 1/144]
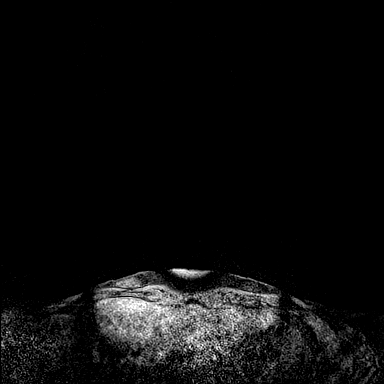
[im 48/144]
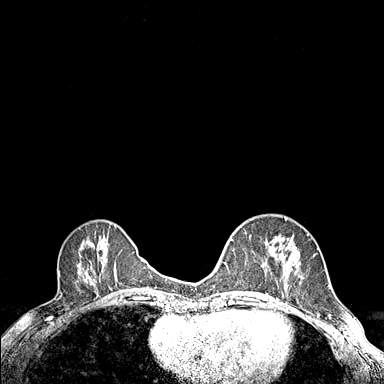
[im 96/144]
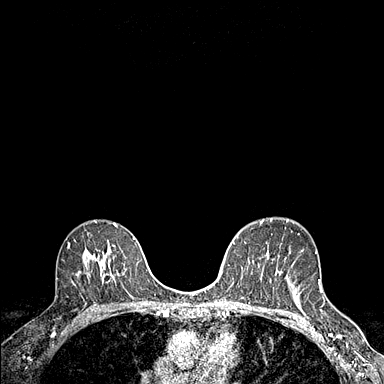
[im 144/144]
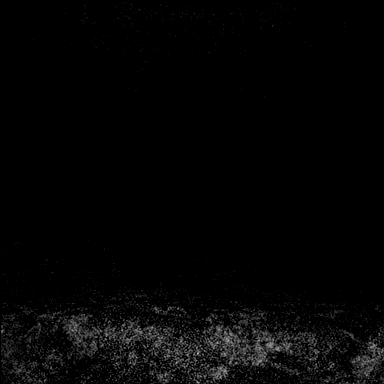

[Series 12: fl3d post-cm 5 · axial · 1.2mm · 0.89mm/px · z∈[-131,+41]mm · 4 of 144 slices shown (2 of 2)]
[im 1/144]
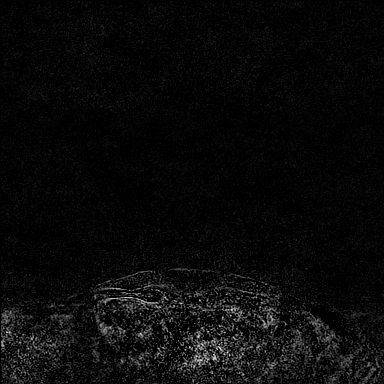
[im 48/144]
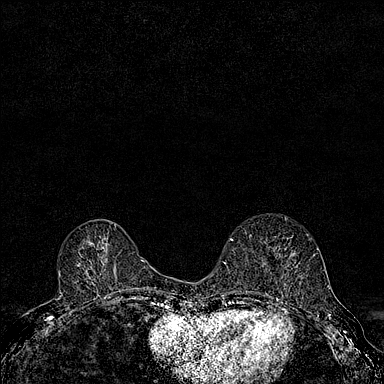
[im 96/144]
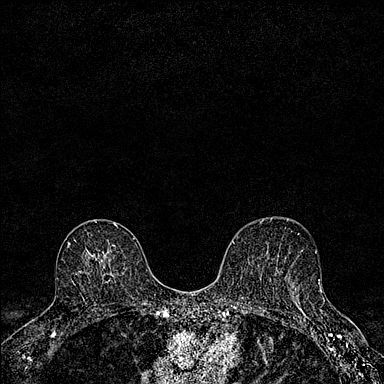
[im 144/144]
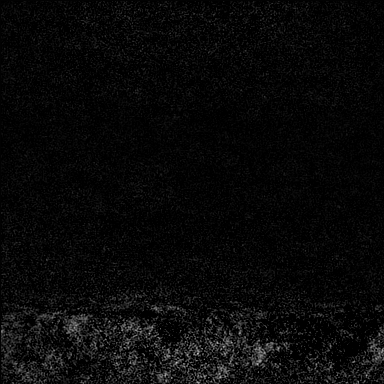

[33 of 48 positions shown; findings below may reference images not displayed]

Three-dimensional MR images were rendered by post-processing of the
original MR data on an independent workstation. The
three-dimensional MR images were interpreted, and findings are
reported in the following complete MRI report for this study. Three
dimensional images were evaluated at the independent interpreting
workstation using the DynaCAD thin client.
FINDINGS: Breast composition: c. Heterogeneous fibroglandular tissue.

Background parenchymal enhancement: Moderate.

Bilateral breast cysts.

Right breast: In the middle third of the upper-outer quadrant of the
right breast is an indeterminate 5 mm enhancing mass (series 6,
image 53).

Left breast: No mass or abnormal enhancement.

Lymph nodes: No abnormal appearing lymph nodes.

Ancillary findings:  None.
IMPRESSION: Indeterminate 5 mm mass in the upper-outer quadrant of the right
breast.

RECOMMENDATION:
MR guided core biopsy of the indeterminate mass in the upper-outer
quadrant of the right breast is recommended.

BI-RADS CATEGORY  4: Suspicious.

## 2021-06-02 MED ORDER — GADOBUTROL 1 MMOL/ML IV SOLN
7.0000 mL | Freq: Once | INTRAVENOUS | Status: AC | PRN
  Administered 2021-06-02: 7 mL via INTRAVENOUS

## 2021-06-05 ENCOUNTER — Other Ambulatory Visit: Payer: Self-pay | Admitting: Hematology and Oncology

## 2021-06-05 DIAGNOSIS — R9389 Abnormal findings on diagnostic imaging of other specified body structures: Secondary | ICD-10-CM

## 2021-06-07 ENCOUNTER — Encounter: Payer: Self-pay | Admitting: Family Medicine

## 2021-06-07 ENCOUNTER — Ambulatory Visit (INDEPENDENT_AMBULATORY_CARE_PROVIDER_SITE_OTHER): Payer: BC Managed Care – PPO | Admitting: Family Medicine

## 2021-06-07 ENCOUNTER — Other Ambulatory Visit: Payer: Self-pay

## 2021-06-07 VITALS — BP 124/62 | HR 62 | Temp 98.0°F | Resp 16 | Ht 67.0 in | Wt 156.0 lb

## 2021-06-07 DIAGNOSIS — S39012A Strain of muscle, fascia and tendon of lower back, initial encounter: Secondary | ICD-10-CM

## 2021-06-07 MED ORDER — CYCLOBENZAPRINE HCL 10 MG PO TABS: 10.0000 mg | ORAL_TABLET | Freq: Three times a day (TID) | ORAL | 0 refills | Status: DC | PRN

## 2021-06-07 MED ORDER — MELOXICAM 15 MG PO TABS: 15.0000 mg | ORAL_TABLET | Freq: Every day | ORAL | 0 refills | Status: DC

## 2021-06-07 NOTE — Progress Notes (Signed)
Subjective:    Patient ID: Autumn Patel, female    DOB: April 14, 1961, 60 y.o.   MRN: [id number]  HPI  Xray 2021- Five lumbar type vertebral bodies are well visualized. Vertebral body height is well maintained. No pars defects are noted. Mild facet hypertrophic changes are noted without significant anterolisthesis. Osteophytic changes are seen as well as disc space narrowing primarily at L2-3.  1 week ago, the patient injured her back.  She does not recall exactly how it happened.  However she is having pain in the lumbar paraspinal muscles to the right side roughly around the level of L4-L5 and S1.  She is tender to palpation in these muscles.  I can reproduce the pain by pressing on the paraspinal muscles.  There is no tenderness to palpation over the spinous processes.  She has pain with standing.  She has pain with walking.  She denies any pain radiating into her gluteus or down her legs.  She denies any numbness or tingling in her legs.  She denies any weakness in her legs.  She has normal strength in her legs and normal reflexes.  She has a negative straight leg raise. Past Medical History:  Diagnosis Date   Allergy    Family history of breast cancer    Family history of leukemia    Hypertension    Past Surgical History:  Procedure Laterality Date   BREAST CYST EXCISION Left    BREAST LUMPECTOMY WITH RADIOACTIVE SEED LOCALIZATION Bilateral 03/22/2020   Procedure: BILATERAL BREAST LUMPECTOMY WITH RADIOACTIVE SEED LOCALIZATION;  Surgeon: Manus Rudd, MD;  Location: [facility];  Service: General;  Laterality: Bilateral;   CESAREAN SECTION     CHOLECYSTECTOMY     Current Outpatient Medications on File Prior to Visit  Medication Sig Dispense Refill   chlorpheniramine-HYDROcodone (TUSSIONEX PENNKINETIC ER) 10-8 MG/5ML SUER Take 5 mLs by mouth every 12 (twelve) hours as needed. 140 mL 0   cyclobenzaprine (FLEXERIL) 10 MG tablet Take 1 tablet (10 mg total) by mouth 3  (three) times daily as needed for muscle spasms. 30 tablet 0   HYDROcodone-acetaminophen (NORCO) 5-325 MG tablet Take 1 tablet by mouth every 6 (six) hours as needed for moderate pain. 20 tablet 0   HYDROcodone-homatropine (HYCODAN) 5-1.5 MG/5ML syrup Take 5 mLs by mouth every 8 (eight) hours as needed for cough. 120 mL 0   meloxicam (MOBIC) 7.5 MG tablet TAKE 1 TABLET BY MOUTH EVERY DAY 30 tablet 0   tamoxifen (NOLVADEX) 20 MG tablet Take 1 tablet (20 mg total) by mouth daily. 90 tablet 0   VITAMIN D PO Take by mouth.     No current facility-administered medications on file prior to visit.   No Known Allergies Social History   Socioeconomic History   Marital status: Married    Spouse name: Not on file   Number of children: Not on file   Years of education: Not on file   Highest education level: Not on file  Occupational History   Not on file  Tobacco Use   Smoking status: Never   Smokeless tobacco: Never  Substance and Sexual Activity   Alcohol use: No   Drug use: No   Sexual activity: Yes    Comment: married  Other Topics Concern   Not on file  Social History Narrative   Not on file   Social Determinants of Health   Financial Resource Strain: Not on file  Food Insecurity: Not on file  Transportation  Needs: Not on file  Physical Activity: Not on file  Stress: Not on file  Social Connections: Not on file  Intimate Partner Violence: Not on file      Review of Systems  All other systems reviewed and are negative.     Objective:   Physical Exam Constitutional:      Appearance: Normal appearance. She is normal weight.  Cardiovascular:     Rate and Rhythm: Normal rate and regular rhythm.     Heart sounds: Normal heart sounds. No murmur heard.   No friction rub. No gallop.  Pulmonary:     Effort: Pulmonary effort is normal. No respiratory distress.     Breath sounds: Normal breath sounds. No stridor. No wheezing, rhonchi or rales.  Musculoskeletal:     Lumbar  back: Spasms and tenderness present. No bony tenderness. Decreased range of motion. Negative right straight leg raise test and negative left straight leg raise test.       Back:  Neurological:     Mental Status: She is alert.       Assessment & Plan:  Strain of lumbar region, initial encounter Begin meloxicam 15 mg daily coupled with Flexeril 10 mg every 8 hours as needed.  Reassess if no better in 1 week or sooner if worsening

## 2021-06-18 ENCOUNTER — Telehealth: Payer: Self-pay | Admitting: *Deleted

## 2021-06-18 ENCOUNTER — Other Ambulatory Visit: Payer: Self-pay | Admitting: Hematology and Oncology

## 2021-06-18 MED ORDER — LORAZEPAM 0.5 MG PO TABS: 0.5000 mg | ORAL_TABLET | Freq: Three times a day (TID) | ORAL | 0 refills | Status: DC

## 2021-06-18 NOTE — Telephone Encounter (Signed)
Received call from pt requesting PRN anti anxiety medication for upcoming breast biopsy. MD sent in prescription for Ativan 0.5 mg tablet for pt to take one tablet 30 minutes prior to procedure.  Pt educated and verbalized understanding.

## 2021-06-21 ENCOUNTER — Ambulatory Visit
Admission: RE | Admit: 2021-06-21 | Discharge: 2021-06-21 | Disposition: A | Discharge status: Home / Self Care | Payer: BC Managed Care – PPO | Source: Ambulatory Visit | Attending: Hematology and Oncology | Admitting: Hematology and Oncology

## 2021-06-21 ENCOUNTER — Other Ambulatory Visit: Payer: Self-pay

## 2021-06-21 ENCOUNTER — Other Ambulatory Visit: Payer: Self-pay | Admitting: Hematology and Oncology

## 2021-06-21 ENCOUNTER — Ambulatory Visit: Admission: RE | Admit: 2021-06-21 | Payer: BC Managed Care – PPO | Source: Ambulatory Visit

## 2021-06-21 DIAGNOSIS — R9389 Abnormal findings on diagnostic imaging of other specified body structures: Secondary | ICD-10-CM

## 2021-06-21 IMAGING — MR MR BREAST*R* WO/W CM
7 of 8 series · 35 of 48 positions shown · IV contrast (7 ml gadavist)
Comparison: Previous exam(s).

CLINICAL DATA: 60-year-old female presenting for biopsy of a mass
in the upper outer right breast.

EXAM:
MR OF THE RIGHT BREAST WITH AND WITHOUT CONTRAST
TECHNIQUE: Multiplanar, multisequence MR images of the right breast were
obtained prior to and following the intravenous administration of 7
ml of Gadavist.

[Series 2: fiducial unilateral · sagittal · 2.0mm · 1.33mm/px · 1 of 52 slices shown]
[im 1/52]
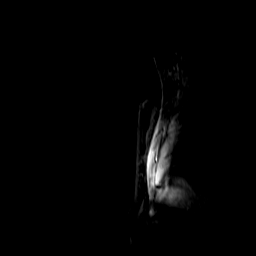

[Series 3: dynamic pre · axial · non-contrast · 1.3mm · 0.73mm/px · z∈[-114,+72]mm · 6 of 144 slices shown]
[im 1/144]
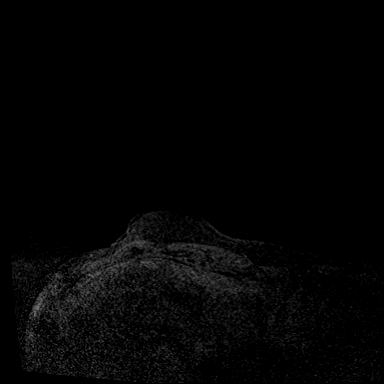
[im 29/144]
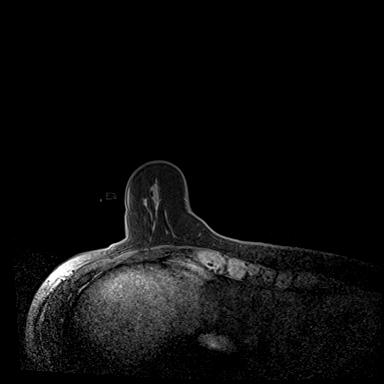
[im 58/144]
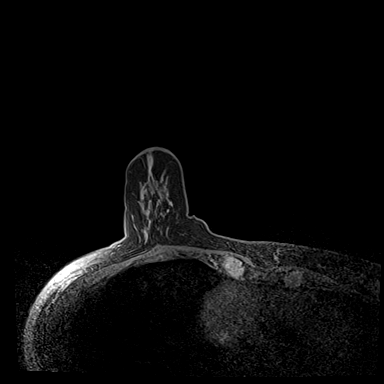
[im 86/144]
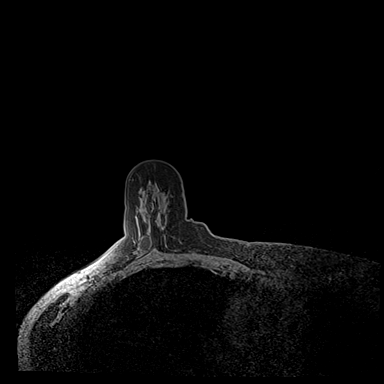
[im 115/144]
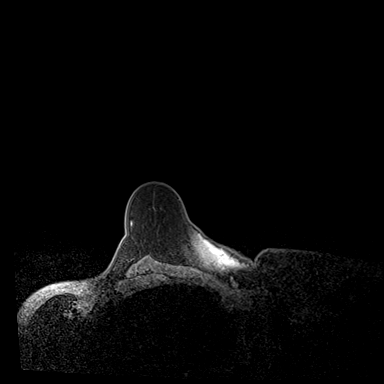
[im 144/144]
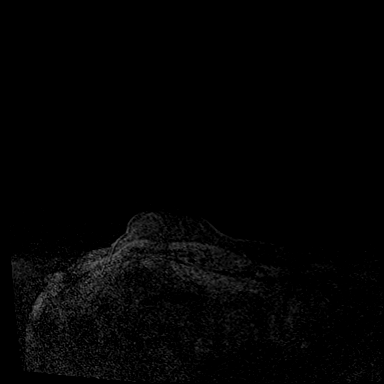

[Series 4: dynamic post 20 · axial · 1.3mm · 0.73mm/px · z∈[-114,+72]mm · 6 of 144 slices shown (1 of 2)]
[im 1/144]
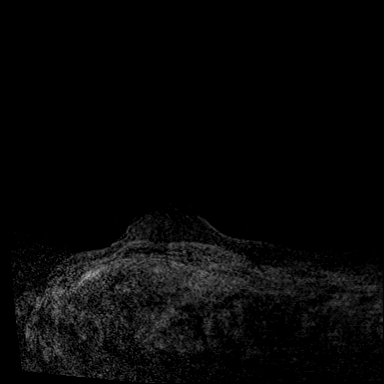
[im 29/144]
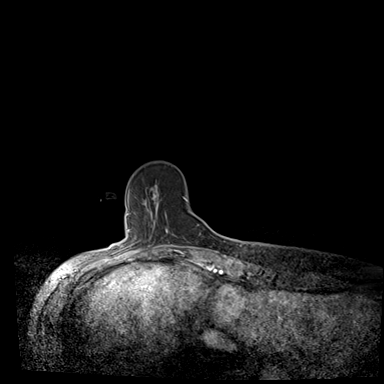
[im 58/144]
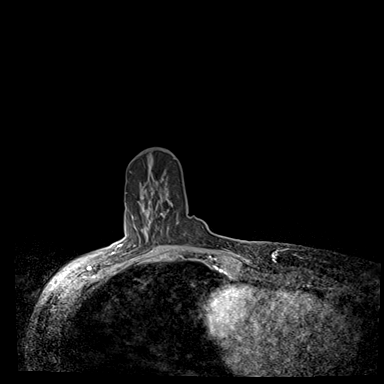
[im 86/144]
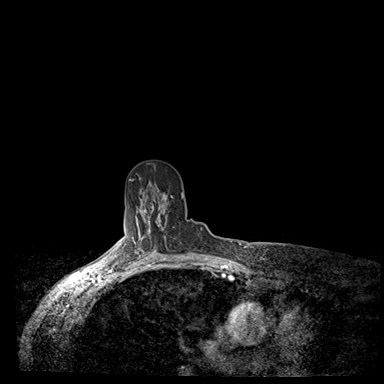
[im 115/144]
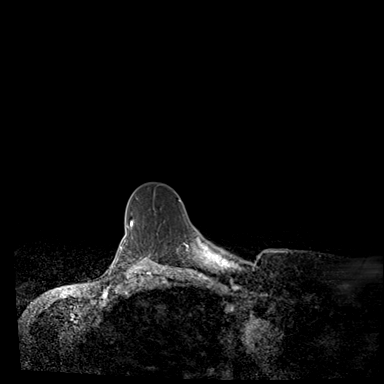
[im 144/144]
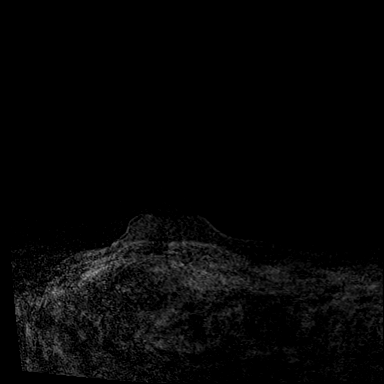

[Series 5: dynamic post 20 · axial · 1.3mm · 0.73mm/px · z∈[-114,+72]mm · 7 of 144 slices shown (2 of 2)]
[im 1/144]
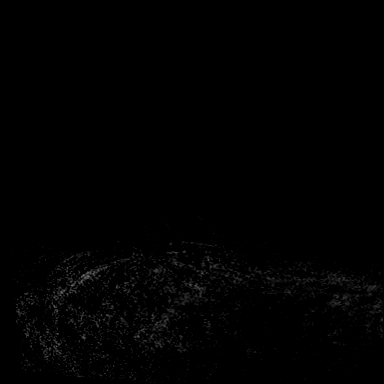
[im 24/144]
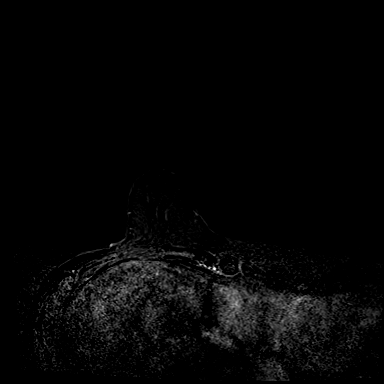
[im 48/144]
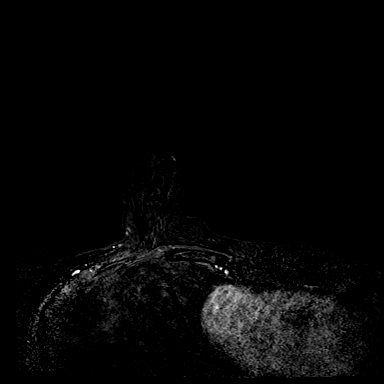
[im 72/144]
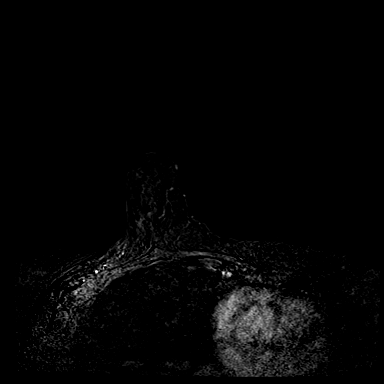
[im 96/144]
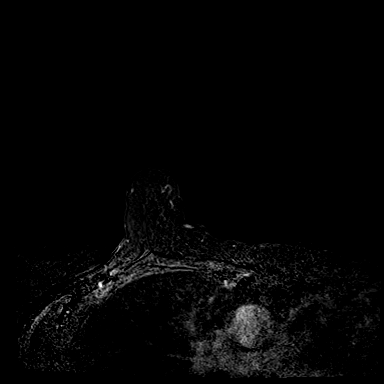
[im 120/144]
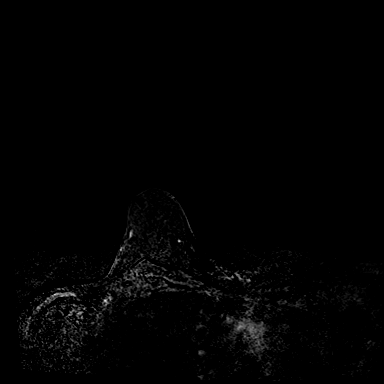
[im 144/144]
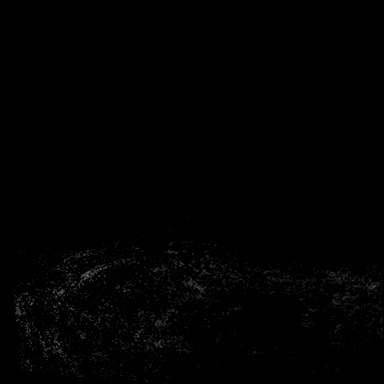

[Series 6: dynamic post 3 · axial · 1.3mm · 0.73mm/px · z∈[-114,+72]mm · 7 of 144 slices shown (1 of 2)]
[im 1/144]
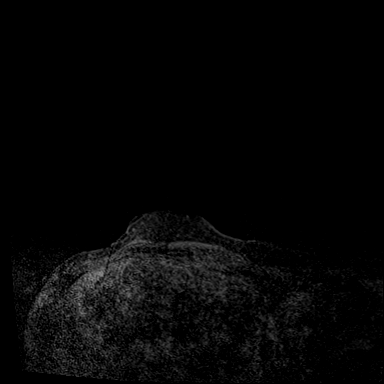
[im 24/144]
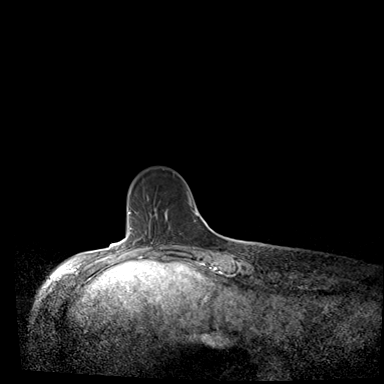
[im 48/144]
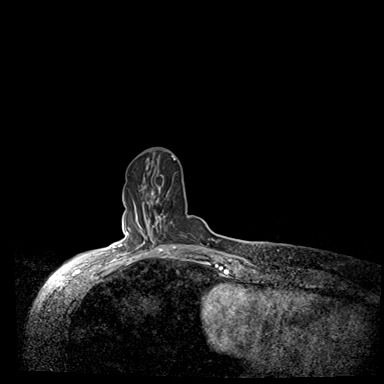
[im 72/144]
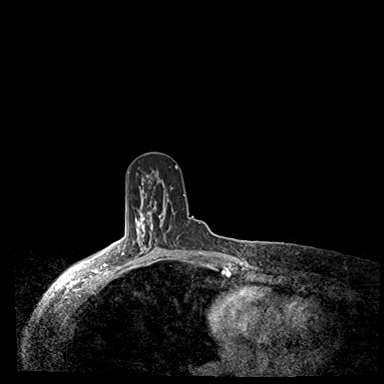
[im 96/144]
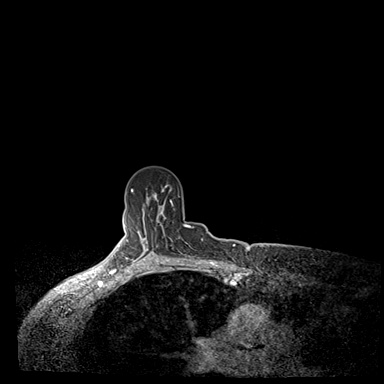
[im 120/144]
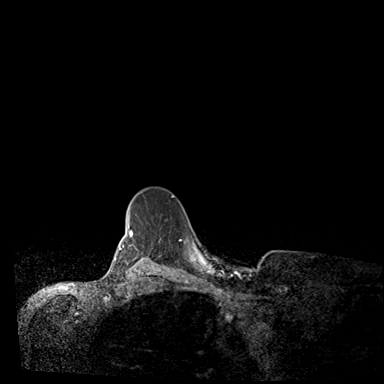
[im 144/144]
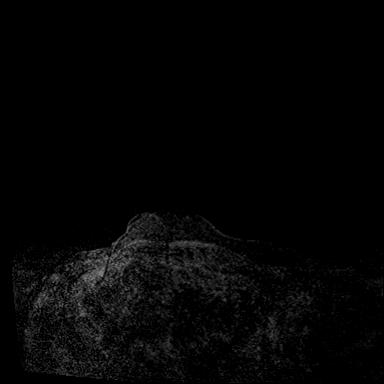

[Series 7: dynamic post 3 · axial · 1.3mm · 0.73mm/px · z∈[-114,+72]mm · 7 of 144 slices shown (2 of 2)]
[im 1/144]
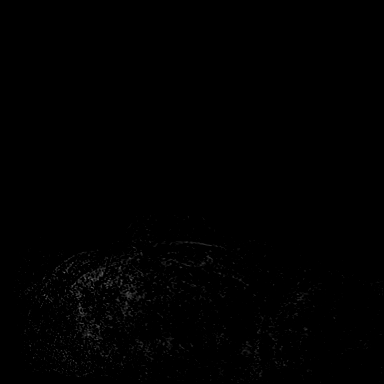
[im 24/144]
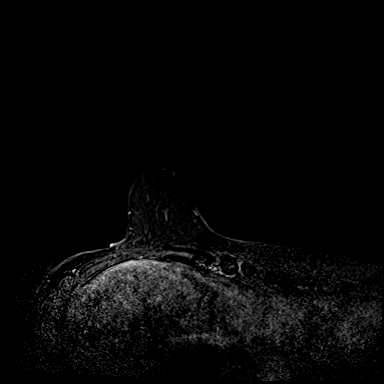
[im 48/144]
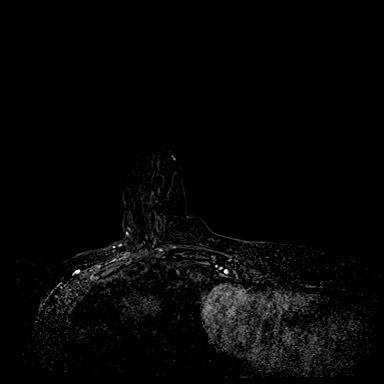
[im 72/144]
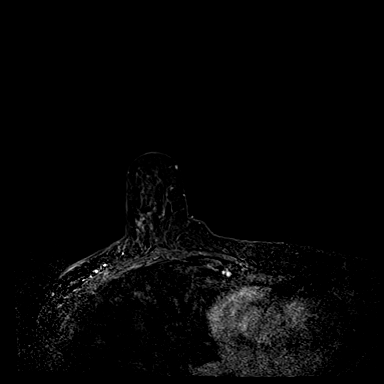
[im 96/144]
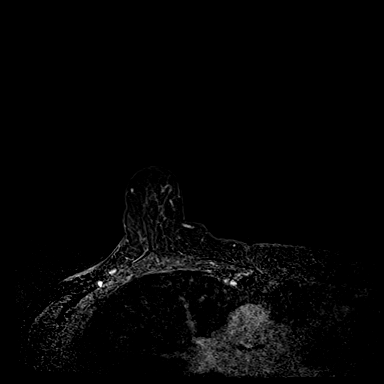
[im 120/144]
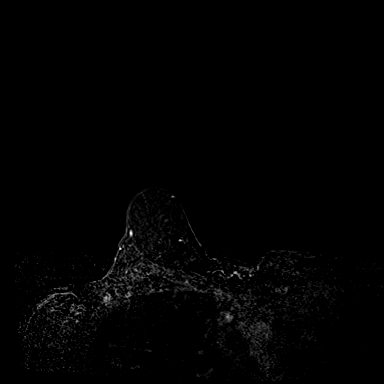
[im 144/144]
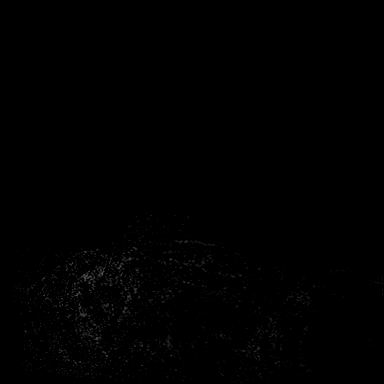

[Series 8: dynamic post 6min · axial · 1.3mm · 0.73mm/px · 1 of 144 slices shown]
[im 1/144]
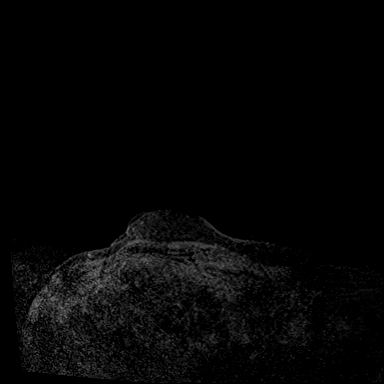

[35 of 48 positions shown; findings below may reference images not displayed]

Three-dimensional MR images were rendered by post-processing of the
original MR data on an independent workstation. The
three-dimensional MR images were interpreted, and findings are
reported in the following complete MRI report for this study. Three
dimensional images were evaluated at the independent DynaCad
workstation
FINDINGS: Breast composition: c. Heterogeneous fibroglandular tissue.

Background parenchymal enhancement: Moderate.

Right breast: The previously identified enhancing mass in the upper
outer right breast does not persist on any of the postcontrast
sequences performed today.
IMPRESSION: No persistent mass in the upper outer quadrant of the right breast.
Therefore biopsy was not performed.

RECOMMENDATION:
Bilateral breast MRI in 6 months.

BI-RADS CATEGORY  1: Negative.

## 2021-06-21 MED ORDER — GADOBUTROL 1 MMOL/ML IV SOLN
7.0000 mL | Freq: Once | INTRAVENOUS | Status: AC | PRN
  Administered 2021-06-21: 10:00:00 7 mL via INTRAVENOUS

## 2021-07-04 ENCOUNTER — Other Ambulatory Visit: Payer: Self-pay | Admitting: Family Medicine

## 2021-07-10 ENCOUNTER — Telehealth: Payer: Self-pay | Admitting: Hematology and Oncology

## 2021-07-10 NOTE — Telephone Encounter (Signed)
Rescheduled appointment per patient. Patient is aware.  

## 2021-07-26 ENCOUNTER — Inpatient Hospital Stay: Payer: BC Managed Care – PPO | Admitting: Hematology and Oncology

## 2021-08-09 ENCOUNTER — Ambulatory Visit: Payer: BC Managed Care – PPO | Admitting: Family Medicine

## 2021-08-09 ENCOUNTER — Other Ambulatory Visit: Payer: Self-pay

## 2021-08-09 ENCOUNTER — Telehealth: Payer: BC Managed Care – PPO | Admitting: Family Medicine

## 2021-08-09 DIAGNOSIS — U071 COVID-19: Secondary | ICD-10-CM

## 2021-08-09 MED ORDER — NIRMATRELVIR/RITONAVIR (PAXLOVID)TABLET: 3.0000 | ORAL_TABLET | Freq: Two times a day (BID) | ORAL | 0 refills | Status: AC

## 2021-08-09 MED ORDER — HYDROCODONE BIT-HOMATROP MBR 5-1.5 MG/5ML PO SOLN: 5.0000 mL | Freq: Three times a day (TID) | ORAL | 0 refills | Status: DC | PRN

## 2021-08-09 MED ORDER — NIRMATRELVIR/RITONAVIR (PAXLOVID)TABLET: 3.0000 | ORAL_TABLET | Freq: Two times a day (BID) | ORAL | 0 refills | Status: DC

## 2021-08-09 NOTE — Progress Notes (Signed)
Subjective:    Patient ID: Autumn Patel, female    DOB: [birth year], 61 y.o.   MRN: [id number]  HPI   Patient is being seen today as a telephone visit.  Phone call began at 1033.  Phone call concluded at 1043.  Patient consents to be seen via telephone.  She is currently at home.  I am currently in my office.  Patient was exposed to someone who had COVID on New Year's Eve.  On Monday she tested negative.  On Monday she started having a runny nose and cough and symptoms of a cold.  On Wednesday she tested positive for COVID.  Symptoms are primarily upper respiratory.  She has had congestion, rhinorrhea, and cough.  She denies any chest pain shortness of breath or dyspnea on exertion.  She denies any fever. Past Medical History:  Diagnosis Date   Allergy    Family history of breast cancer    Family history of leukemia    Hypertension    Past Surgical History:  Procedure Laterality Date   BREAST CYST EXCISION Left    BREAST LUMPECTOMY WITH RADIOACTIVE SEED LOCALIZATION Bilateral 03/22/2020   Procedure: BILATERAL BREAST LUMPECTOMY WITH RADIOACTIVE SEED LOCALIZATION;  Surgeon: Manus Rudd, MD;  Location: [facility];  Service: General;  Laterality: Bilateral;   CESAREAN SECTION     CHOLECYSTECTOMY     Current Outpatient Medications on File Prior to Visit  Medication Sig Dispense Refill   cyclobenzaprine (FLEXERIL) 10 MG tablet Take 1 tablet (10 mg total) by mouth 3 (three) times daily as needed for muscle spasms. 30 tablet 0   LORazepam (ATIVAN) 0.5 MG tablet Take 1 tablet (0.5 mg total) by mouth every 8 (eight) hours. Take before biopsies 5 tablet 0   meloxicam (MOBIC) 15 MG tablet TAKE 1 TABLET (15 MG TOTAL) BY MOUTH DAILY. 30 tablet 0   tamoxifen (NOLVADEX) 20 MG tablet Take 1 tablet (20 mg total) by mouth daily. 90 tablet 0   VITAMIN D PO Take by mouth.     No current facility-administered medications on file prior to visit.   Marland Kitchenall Social History   Socioeconomic  History   Marital status: Married    Spouse name: Not on file   Number of children: Not on file   Years of education: Not on file   Highest education level: Not on file  Occupational History   Not on file  Tobacco Use   Smoking status: Never   Smokeless tobacco: Never  Substance and Sexual Activity   Alcohol use: No   Drug use: No   Sexual activity: Yes    Comment: married  Other Topics Concern   Not on file  Social History Narrative   Not on file   Social Determinants of Health   Financial Resource Strain: Not on file  Food Insecurity: Not on file  Transportation Needs: Not on file  Physical Activity: Not on file  Stress: Not on file  Social Connections: Not on file  Intimate Partner Violence: Not on file     Review of Systems  All other systems reviewed and are negative.     Objective:   Physical Exam        Assessment & Plan:   COVID-19 Patient has COVID-19.  Recommend quarantine for 5 days from onset of symptoms.  She can return to work next week.  We will treat her with Paxlovid given history of breast cancer and her age greater than 53 tablets  twice daily for 5 days.  Use Hycodan for cough.

## 2021-08-10 ENCOUNTER — Telehealth: Payer: Self-pay

## 2021-08-10 NOTE — Telephone Encounter (Signed)
Patient called stating the Paxlovid is making her sick on the stomach and leaving a bad taste in her mouth. Per patient she asked if she could stop taking Paxlovid?

## 2021-08-16 NOTE — Assessment & Plan Note (Signed)
Risk of invasive and noninvasive breast cancer with LCIS is 1% per year. Her cumulative risk lifetime would be around30%%. Tamoxifen would reduce this risk by half. This is based on NSABP P-1 clinical trial  Risk reduction strategies: 1. Tamoxifen 20 mg daily.  Breast Cancer Surveillance: 1. Mammogram 10/18/20: Benign, density Cat C 2. Breast MRI 06/21/21: No persistent mass in the upper outer quadrant of the right breast  Recommended annual mammograms, breast MRIand breast exams. I will order a mammogram to be done in March 2022 and a breast MRI to be done in September 2022.  Tamoxifen toxicities: 1. Hot flashes: Mild to moderate Return to clinic in 1 year for follow-up

## 2021-08-17 ENCOUNTER — Other Ambulatory Visit: Payer: Self-pay

## 2021-08-17 ENCOUNTER — Inpatient Hospital Stay: Payer: BC Managed Care – PPO | Attending: Hematology and Oncology | Admitting: Hematology and Oncology

## 2021-08-17 DIAGNOSIS — D0502 Lobular carcinoma in situ of left breast: Secondary | ICD-10-CM

## 2021-08-17 DIAGNOSIS — D0501 Lobular carcinoma in situ of right breast: Secondary | ICD-10-CM

## 2021-08-17 DIAGNOSIS — Z79810 Long term (current) use of selective estrogen receptor modulators (SERMs): Secondary | ICD-10-CM | POA: Insufficient documentation

## 2021-08-17 DIAGNOSIS — N951 Menopausal and female climacteric states: Secondary | ICD-10-CM | POA: Insufficient documentation

## 2021-08-17 MED ORDER — TAMOXIFEN CITRATE 20 MG PO TABS: 20.0000 mg | ORAL_TABLET | Freq: Every day | ORAL | 3 refills | Status: DC

## 2021-08-17 NOTE — Progress Notes (Signed)
Patient Care Team: Donita Brooks, MD as PCP - General (Family Medicine)  DIAGNOSIS:  Encounter Diagnosis  Name Primary?   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  She had an MRI of her breasts and it required another MRI for follow-up and.  They did not biopsy anything because the abnormality disappeared on the subsequent MRI.  She was asked to get another MRI on the breast in 6 months.  She denies any lumps or nodules.  She is tolerating tamoxifen extremely well denies any hot flashes or arthralgias or myalgias.  The hot flashes are fairly mild.   ALLERGIES:  has No Known Allergies.  MEDICATIONS:  Current Outpatient Medications  Medication Sig Dispense Refill   cyclobenzaprine (FLEXERIL) 10 MG tablet Take 1 tablet (10 mg total) by mouth 3 (three) times daily as needed for muscle spasms. 30 tablet 0   HYDROcodone bit-homatropine (HYCODAN) 5-1.5 MG/5ML syrup Take 5 mLs by mouth every 8 (eight) hours as needed for cough. 120 mL 0   LORazepam (ATIVAN) 0.5 MG tablet Take 1 tablet (0.5 mg total) by mouth every 8 (eight) hours. Take before biopsies 5 tablet 0   meloxicam (MOBIC) 15 MG tablet TAKE 1 TABLET (15 MG TOTAL) BY MOUTH DAILY. 30 tablet 0   tamoxifen (NOLVADEX) 20 MG tablet Take 1 tablet (20 mg total) by mouth daily. 90 tablet 0   VITAMIN D PO Take by mouth.     No current facility-administered medications for this visit.    PHYSICAL EXAMINATION: ECOG PERFORMANCE STATUS:  1 - Symptomatic but completely ambulatory  Vitals:   08/17/21 1018  BP: (!) 169/87  Pulse: 76  Resp: 18  Temp: 97.8 F (36.6 C)  SpO2: 100%   Filed Weights   08/17/21 1018  Weight: 162 lb 3.2 oz (73.6 kg)    BREAST: No palpable masses or nodules in either right or left breasts. No palpable axillary supraclavicular or infraclavicular adenopathy no breast tenderness or nipple discharge. (exam performed in the presence of a chaperone)  LABORATORY DATA:  I have reviewed the data as listed CMP Latest Ref Rng & Units 03/13/2018 09/30/2016 12/05/2015  Glucose 65 - 99 mg/dL 83 90 053(Z)  BUN 7 - 25 mg/dL 16 14 12   Creatinine 0.50 - 1.05 mg/dL 7.67 3.41  Sodium 135 - 146 mmol/L 141 141 135  Potassium 3.5 - 5.3 mmol/L 4.4 3.9 3.7  Chloride 98 - 110 mmol/L 107 105 100  CO2 20 - 32 mmol/L 27 29 26   Calcium 8.6 - 10.4 mg/dL 9.5 9.6 8.7  Total Protein 6.1 - 8.1 g/dL 7.0 6.7 6.6  Total Bilirubin 0.2 - 1.2 mg/dL 0.5 0.4 0.3  Alkaline Phos 33 - 130 U/L - 72 64  AST 10 - 35 U/L 16 15 20   ALT 6 - 29 U/L 17 13 13     Lab Results  Component Value Date   WBC 4.8 03/13/2018   HGB 12.5 03/13/2018   HCT 37.5 03/13/2018  MCV 83.5 03/13/2018   PLT 320 03/13/2018   NEUTROABS 2,587 03/13/2018    ASSESSMENT & PLAN:  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 Her cumulative risk lifetime would be around 30% %. Tamoxifen would reduce this risk by half. This is based on NSABP P-1 clinical trial   Risk reduction strategies: 1. Tamoxifen 20 mg daily.  Breast Cancer Surveillance: 1. Mammogram 10/18/20: Benign, density Cat C 2. Breast MRI 06/21/21: No persistent mass in the upper outer quadrant of the right breast Right breast a repeat mammogram will be done in May 2023. If that is clear then we can do MRIs once a year from that point onwards.   Tamoxifen toxicities: 1. Hot flashes: Mild to moderate Return to clinic  in 1 year for follow-up   No orders of the defined types were placed in this encounter.  The patient has a good understanding of the overall plan. she agrees with it. she will call with any problems that may develop before the next visit here. Total time spent: 30 mins including face to face time and time spent for planning, charting and co-ordination of care   [name], MD 08/17/21

## 2021-08-22 ENCOUNTER — Encounter (HOSPITAL_COMMUNITY): Payer: Self-pay

## 2021-11-12 ENCOUNTER — Other Ambulatory Visit: Payer: Self-pay | Admitting: Hematology and Oncology

## 2021-11-12 DIAGNOSIS — Z1231 Encounter for screening mammogram for malignant neoplasm of breast: Secondary | ICD-10-CM

## 2021-11-15 ENCOUNTER — Ambulatory Visit
Admission: RE | Admit: 2021-11-15 | Discharge: 2021-11-15 | Disposition: A | Discharge status: Home / Self Care | Payer: BC Managed Care – PPO | Source: Ambulatory Visit | Attending: Hematology and Oncology | Admitting: Hematology and Oncology

## 2021-11-15 DIAGNOSIS — Z1231 Encounter for screening mammogram for malignant neoplasm of breast: Secondary | ICD-10-CM

## 2021-11-19 ENCOUNTER — Other Ambulatory Visit: Payer: Self-pay | Admitting: Hematology and Oncology

## 2021-11-19 DIAGNOSIS — R928 Other abnormal and inconclusive findings on diagnostic imaging of breast: Secondary | ICD-10-CM

## 2021-12-05 ENCOUNTER — Ambulatory Visit
Admission: RE | Admit: 2021-12-05 | Discharge: 2021-12-05 | Disposition: A | Discharge status: Home / Self Care | Payer: BC Managed Care – PPO | Source: Ambulatory Visit | Attending: Hematology and Oncology | Admitting: Hematology and Oncology

## 2021-12-05 DIAGNOSIS — R928 Other abnormal and inconclusive findings on diagnostic imaging of breast: Secondary | ICD-10-CM

## 2021-12-05 IMAGING — US US BREAST*R* LIMITED INC AXILLA
1 series · 8 of 8 positions shown · non-contrast
Comparison: Prior studies

CLINICAL DATA: 60-year-old female for further evaluation of RIGHT
breast mass identified on screening mammogram.

EXAM:
ULTRASOUND OF THE RIGHT BREAST

[Series 1: us breast*right* limited inc axilla · 0.07mm/px · 8 of 8 slices shown]
[im 1/8]
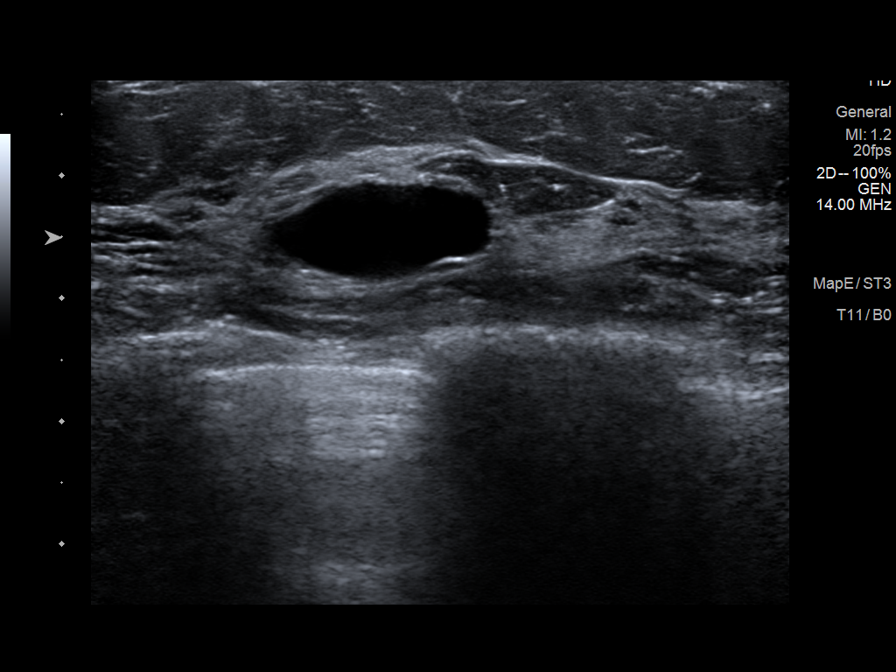
[im 2/8]
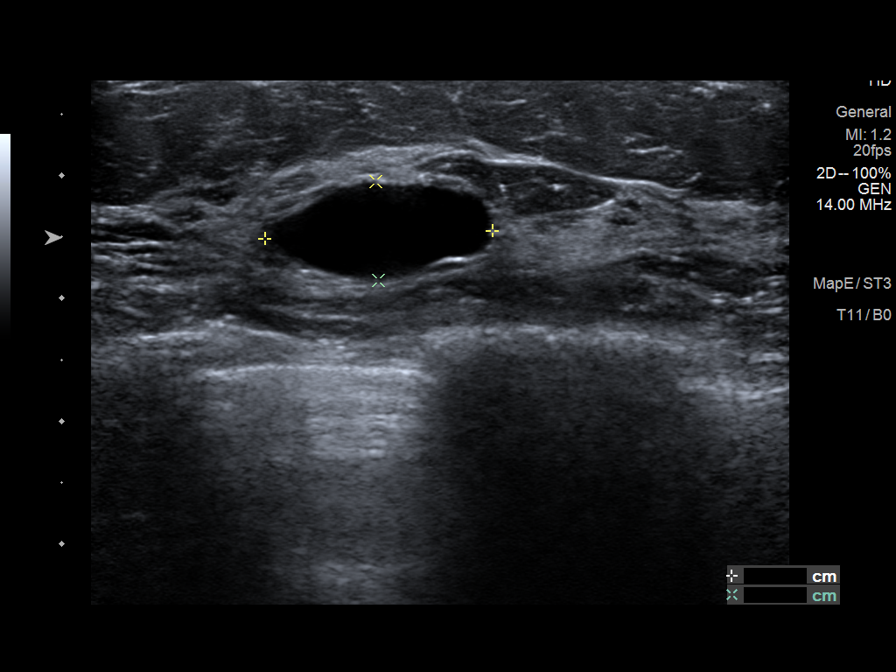
[im 3/8]
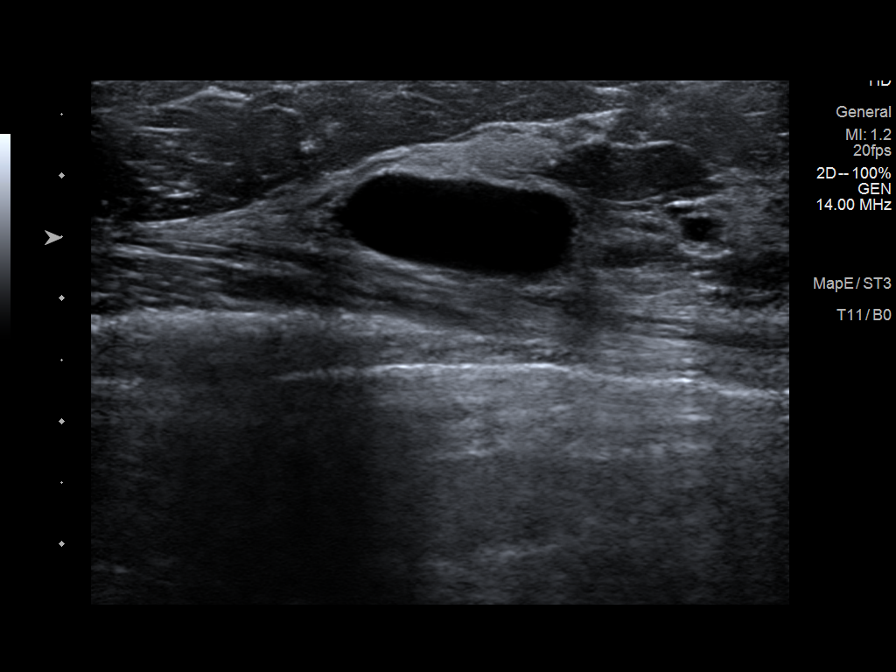
[im 4/8]
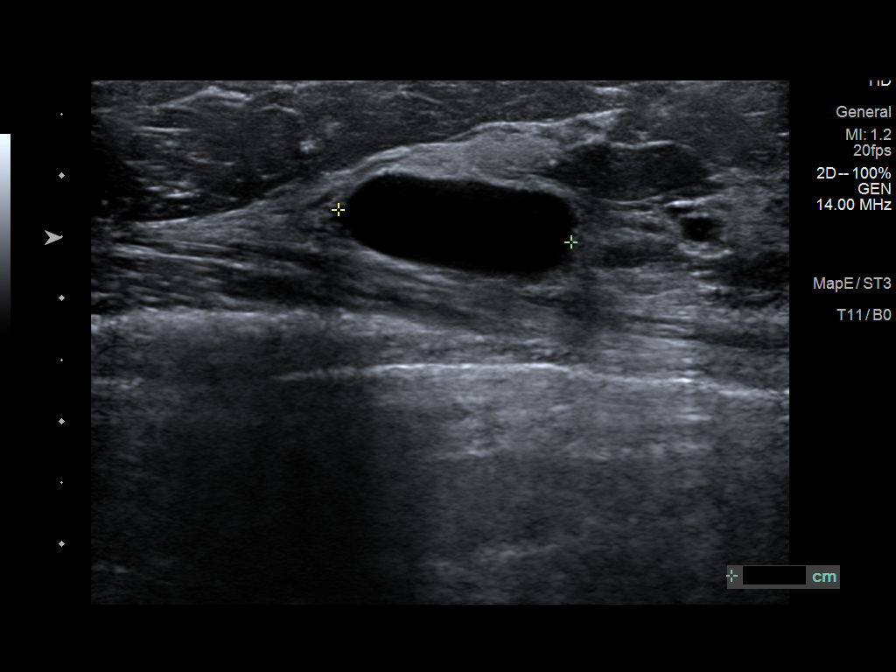
[im 5/8]
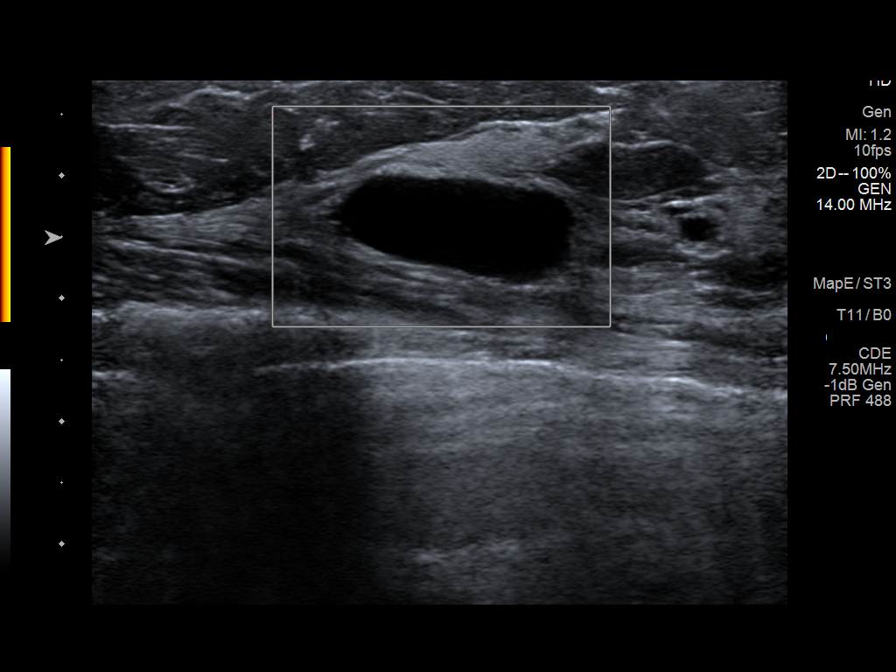
[im 6/8]
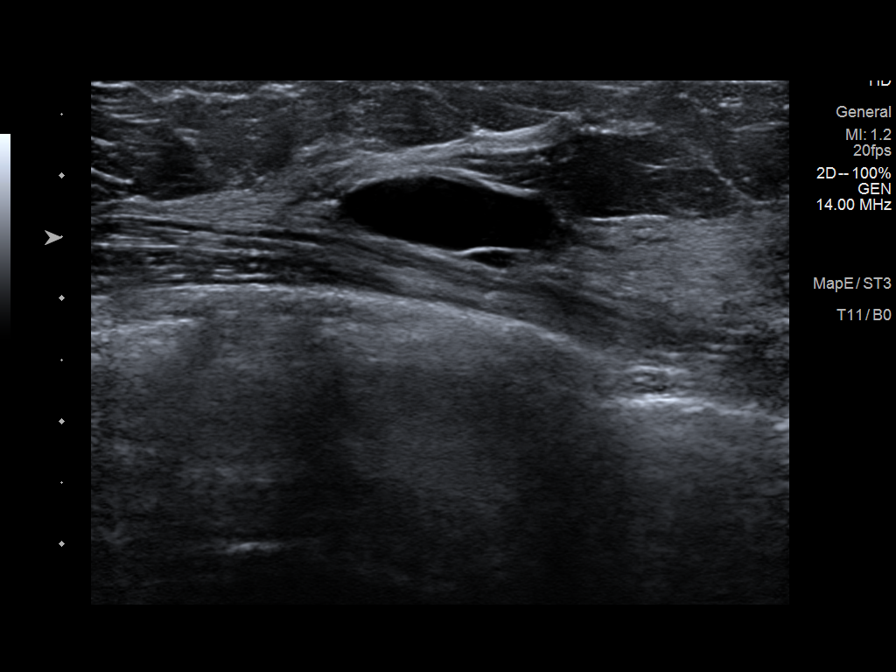
[im 7/8]
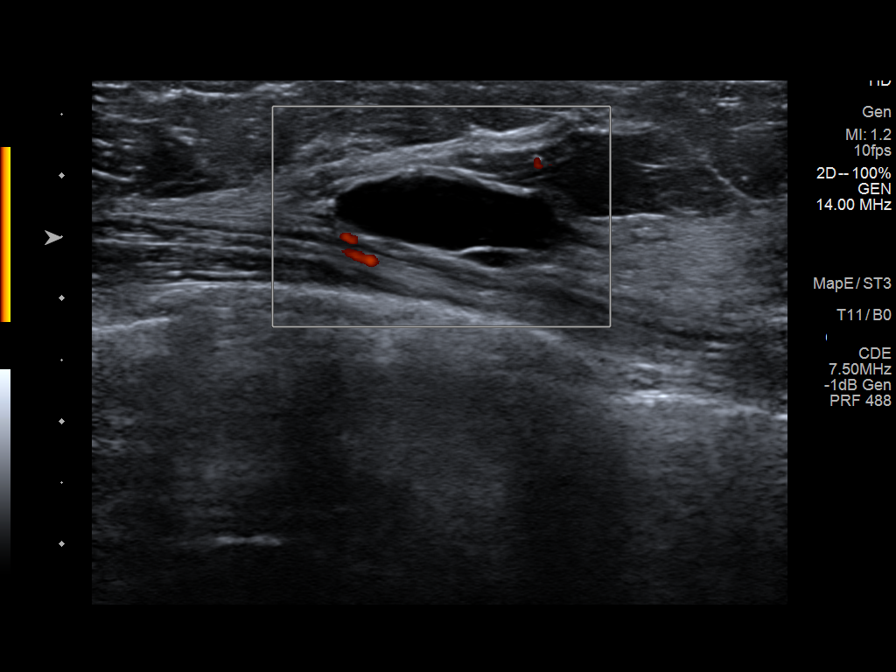
[im 8/8]
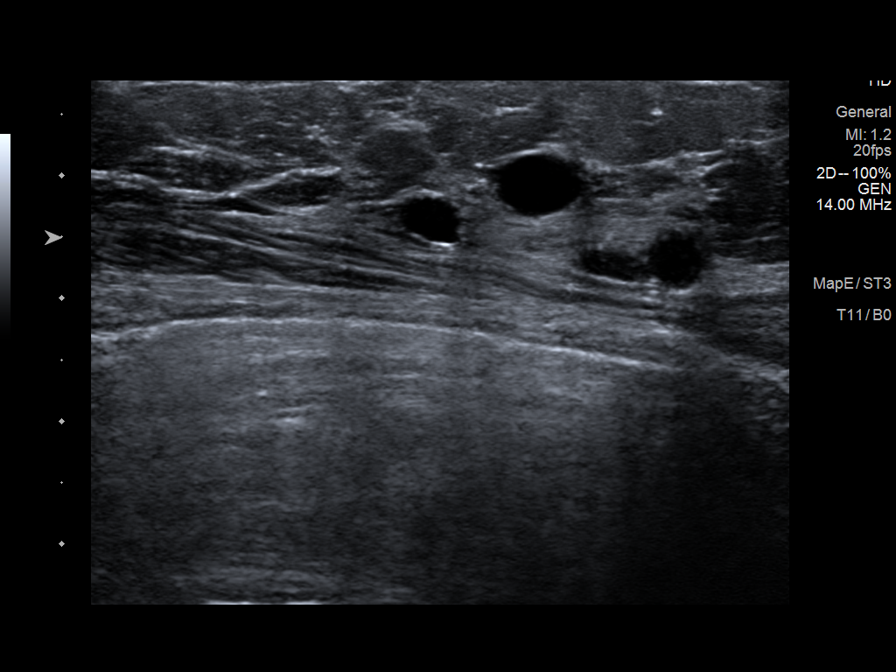

[8 of 8 positions shown; findings below may reference images not displayed]

FINDINGS: Targeted ultrasound is performed, showing several benign simple cyst
in the UPPER-OUTER RIGHT breast. A 1.9 x 0.8 x 1.9 cm simple cyst at
the [DATE] position of the RIGHT breast 5 cm from the nipple
corresponds to the screening study finding.
IMPRESSION: Benign cyst in the UPPER-OUTER RIGHT breast corresponding to the
screening study finding.

RECOMMENDATION:
Bilateral screening mammogram in 1 year.

Patient does have a breast MRI scheduled for [DATE] for
follow-up of a previous MR finding.

I have discussed the findings and recommendations with the patient.
If applicable, a reminder letter will be sent to the patient
regarding the next appointment.

BI-RADS CATEGORY  2: Benign.

## 2021-12-12 ENCOUNTER — Ambulatory Visit
Admission: RE | Admit: 2021-12-12 | Discharge: 2021-12-12 | Disposition: A | Discharge status: Home / Self Care | Payer: BC Managed Care – PPO | Source: Ambulatory Visit | Attending: Hematology and Oncology | Admitting: Hematology and Oncology

## 2021-12-12 DIAGNOSIS — D0501 Lobular carcinoma in situ of right breast: Secondary | ICD-10-CM

## 2021-12-12 MED ORDER — GADOBUTROL 1 MMOL/ML IV SOLN
7.0000 mL | Freq: Once | INTRAVENOUS | Status: AC | PRN
  Administered 2021-12-12: 7 mL via INTRAVENOUS

## 2021-12-17 ENCOUNTER — Telehealth: Payer: Self-pay

## 2021-12-17 NOTE — Telephone Encounter (Signed)
Left VM, per LC, MRI negative.  Requested pt call back with additional questions or concerns.   ?

## 2021-12-17 NOTE — Telephone Encounter (Signed)
-----   Message from Loa Socks, NP sent at 12/14/2021  1:19 PM EDT ----- ?Please notify patient that MRI is negative. ?----- Message ----- ?From: Interface, Rad Results In ?Sent: 12/13/2021   5:30 PM EDT ?To: Loa Socks, NP ? ? ?

## 2021-12-20 ENCOUNTER — Ambulatory Visit: Payer: BC Managed Care – PPO | Admitting: Family Medicine

## 2021-12-20 ENCOUNTER — Encounter: Payer: Self-pay | Admitting: Family Medicine

## 2021-12-20 VITALS — BP 134/78 | HR 83 | Temp 98.3°F | Ht 67.0 in | Wt 159.4 lb

## 2021-12-20 DIAGNOSIS — J069 Acute upper respiratory infection, unspecified: Secondary | ICD-10-CM | POA: Diagnosis not present

## 2021-12-20 MED ORDER — AMOXICILLIN-POT CLAVULANATE 875-125 MG PO TABS: 1.0000 | ORAL_TABLET | Freq: Two times a day (BID) | ORAL | 0 refills | Status: DC

## 2021-12-20 NOTE — Progress Notes (Signed)
Subjective:    Patient ID: Autumn Patel, female    DOB: 06-07-1961, 61 y.o.   MRN: [id number]  HPI Patient presents with a 2-day history of head congestion and rhinorrhea.  She is extremely congested today.  She states that she had a fever to 101 and 102 last night but she took some ibuprofen and feels much better this morning.  Today she denies any pain in her maxillary or frontal sinuses.  She is obviously congested with clear rhinorrhea.  She denies any otalgia.  She does have a scratchy throat.  She denies any cough or shortness of breath or chest pain Past Medical History:  Diagnosis Date   Allergy    Family history of breast cancer    Family history of leukemia    Hypertension    Past Surgical History:  Procedure Laterality Date   BREAST CYST EXCISION Left    BREAST LUMPECTOMY WITH RADIOACTIVE SEED LOCALIZATION Bilateral 03/22/2020   Procedure: BILATERAL BREAST LUMPECTOMY WITH RADIOACTIVE SEED LOCALIZATION;  Surgeon: Manus Rudd, MD;  Location: [facility];  Service: General;  Laterality: Bilateral;   CESAREAN SECTION     CHOLECYSTECTOMY     Current Outpatient Medications on File Prior to Visit  Medication Sig Dispense Refill   cyclobenzaprine (FLEXERIL) 10 MG tablet Take 1 tablet (10 mg total) by mouth 3 (three) times daily as needed for muscle spasms. 30 tablet 0   HYDROcodone bit-homatropine (HYCODAN) 5-1.5 MG/5ML syrup Take 5 mLs by mouth every 8 (eight) hours as needed for cough. 120 mL 0   LORazepam (ATIVAN) 0.5 MG tablet Take 1 tablet (0.5 mg total) by mouth every 8 (eight) hours. Take before biopsies 5 tablet 0   meloxicam (MOBIC) 15 MG tablet TAKE 1 TABLET (15 MG TOTAL) BY MOUTH DAILY. 30 tablet 0   tamoxifen (NOLVADEX) 20 MG tablet Take 1 tablet (20 mg total) by mouth daily. 90 tablet 3   VITAMIN D PO Take by mouth.     No current facility-administered medications on file prior to visit.   No Known Allergies Social History   Socioeconomic History    Marital status: Married    Spouse name: Not on file   Number of children: Not on file   Years of education: Not on file   Highest education level: Not on file  Occupational History   Not on file  Tobacco Use   Smoking status: Never   Smokeless tobacco: Never  Substance and Sexual Activity   Alcohol use: No   Drug use: No   Sexual activity: Yes    Comment: married  Other Topics Concern   Not on file  Social History Narrative   Not on file   Social Determinants of Health   Financial Resource Strain: Not on file  Food Insecurity: Not on file  Transportation Needs: Not on file  Physical Activity: Not on file  Stress: Not on file  Social Connections: Not on file  Intimate Partner Violence: Not on file     Review of Systems  All other systems reviewed and are negative.     Objective:   Physical Exam Vitals reviewed.  Constitutional:      General: She is not in acute distress.    Appearance: She is well-developed. She is not diaphoretic.  HENT:     Right Ear: Tympanic membrane, ear canal and external ear normal.     Left Ear: Tympanic membrane, ear canal and external ear normal.  Nose: Mucosal edema, congestion and rhinorrhea present.     Right Sinus: No maxillary sinus tenderness or frontal sinus tenderness.     Left Sinus: No maxillary sinus tenderness or frontal sinus tenderness.     Mouth/Throat:     Pharynx: No oropharyngeal exudate or posterior oropharyngeal erythema.  Eyes:     Conjunctiva/sclera: Conjunctivae normal.  Cardiovascular:     Rate and Rhythm: Normal rate and regular rhythm.     Heart sounds: Normal heart sounds. No murmur heard. Pulmonary:     Effort: Pulmonary effort is normal. No respiratory distress.     Breath sounds: Normal breath sounds. No wheezing or rales.  Chest:     Chest wall: No tenderness.  Abdominal:     General: Bowel sounds are normal.     Palpations: Abdomen is soft.  Musculoskeletal:     Cervical back: Neck supple.   Lymphadenopathy:     Cervical: No cervical adenopathy.          Assessment & Plan:  Viral upper respiratory tract infection I suspect the patient has a head cold.  I recommended using ibuprofen and Sudafed for the next 3 to 4 days along with Mucinex and allow tincture of time.  I explained that she is contagious.  If she develops worsening fever and severe pain in her frontal or maxillary sinuses I did give her Augmentin for secondary sinus infection but I instructed her not to get the prescription and take it unless symptoms persist and worsen as described above.  I gave her the prescription only because she is going to the beach this weekend.  Patient agrees and will not take medication unless clinically indicated

## 2022-08-02 ENCOUNTER — Telehealth: Payer: Self-pay | Admitting: Hematology and Oncology

## 2022-08-02 NOTE — Telephone Encounter (Signed)
Rescheduled appointment per provider PAL. Left voicemail. 

## 2022-08-19 ENCOUNTER — Ambulatory Visit: Payer: BC Managed Care – PPO | Admitting: Family Medicine

## 2022-08-19 ENCOUNTER — Ambulatory Visit: Payer: BC Managed Care – PPO | Admitting: Hematology and Oncology

## 2022-08-19 VITALS — BP 136/76 | HR 82 | Temp 98.4°F | Ht 67.0 in | Wt 180.0 lb

## 2022-08-19 DIAGNOSIS — R051 Acute cough: Secondary | ICD-10-CM | POA: Diagnosis not present

## 2022-08-19 MED ORDER — HYDROCOD POLI-CHLORPHE POLI ER 10-8 MG/5ML PO SUER: 5.0000 mL | Freq: Two times a day (BID) | ORAL | 0 refills | Status: DC | PRN

## 2022-08-19 MED ORDER — AMOXICILLIN-POT CLAVULANATE 875-125 MG PO TABS: 1.0000 | ORAL_TABLET | Freq: Two times a day (BID) | ORAL | 0 refills | Status: DC

## 2022-08-19 NOTE — Progress Notes (Signed)
Subjective:    Patient ID: Autumn Patel, female    DOB: 1960-12-08, 62 y.o.   MRN: [id number]  HPI Patient is a very pleasant 62 year old Caucasian female who presents today complaining of runny nose head congestion and cough.  She states that she had a cold for more than a week around Christmas time however this completely went away.  She still has a lingering cough but about 7 days ago she developed head congestion and rhinorrhea and pressure in her sinuses.  She denies any purulent sputum.  She denies any chest pain or shortness of breath.  She denies any sinus pain.  She denies any headache or sore throat Past Medical History:  Diagnosis Date   Allergy    Family history of breast cancer    Family history of leukemia    Hypertension    Past Surgical History:  Procedure Laterality Date   BREAST CYST EXCISION Left    BREAST LUMPECTOMY WITH RADIOACTIVE SEED LOCALIZATION Bilateral 03/22/2020   Procedure: BILATERAL BREAST LUMPECTOMY WITH RADIOACTIVE SEED LOCALIZATION;  Surgeon: Donnie Mesa, MD;  Location: [place];  Service: General;  Laterality: Bilateral;   CESAREAN SECTION     CHOLECYSTECTOMY     Current Outpatient Medications on File Prior to Visit  Medication Sig Dispense Refill   tamoxifen (NOLVADEX) 20 MG tablet Take 1 tablet (20 mg total) by mouth daily. 90 tablet 3   No current facility-administered medications on file prior to visit.     No Known Allergies Social History   Socioeconomic History   Marital status: Married    Spouse name: Not on file   Number of children: Not on file   Years of education: Not on file   Highest education level: Not on file  Occupational History   Not on file  Tobacco Use   Smoking status: Never   Smokeless tobacco: Never  Substance and Sexual Activity   Alcohol use: No   Drug use: No   Sexual activity: Yes    Comment: married  Other Topics Concern   Not on file  Social History Narrative   Not on file    Social Determinants of Health   Financial Resource Strain: Not on file  Food Insecurity: Not on file  Transportation Needs: Not on file  Physical Activity: Not on file  Stress: Not on file  Social Connections: Not on file  Intimate Partner Violence: Not on file     Review of Systems  All other systems reviewed and are negative.      Objective:   Physical Exam Vitals reviewed.  Constitutional:      General: She is not in acute distress.    Appearance: She is well-developed. She is not diaphoretic.  HENT:     Right Ear: Tympanic membrane, ear canal and external ear normal.     Left Ear: Tympanic membrane, ear canal and external ear normal.     Nose: Mucosal edema, congestion and rhinorrhea present.     Right Sinus: No maxillary sinus tenderness or frontal sinus tenderness.     Left Sinus: No maxillary sinus tenderness or frontal sinus tenderness.     Mouth/Throat:     Pharynx: No oropharyngeal exudate or posterior oropharyngeal erythema.  Eyes:     Conjunctiva/sclera: Conjunctivae normal.  Cardiovascular:     Rate and Rhythm: Normal rate and regular rhythm.     Heart sounds: Normal heart sounds. No murmur heard. Pulmonary:     Effort: Pulmonary  effort is normal. No respiratory distress.     Breath sounds: Normal breath sounds. No wheezing or rales.  Chest:     Chest wall: No tenderness.  Abdominal:     General: Bowel sounds are normal.     Palpations: Abdomen is soft.  Musculoskeletal:     Cervical back: Neck supple.  Lymphadenopathy:     Cervical: No cervical adenopathy.           Assessment & Plan:  Acute cough - Plan: chlorpheniramine-HYDROcodone (TUSSIONEX) 10-8 MG/5ML, DISCONTINUED: chlorpheniramine-HYDROcodone (TUSSIONEX) 10-8 MG/5ML I explained to the patient that I feel that she most likely just coxsackievirus after the first.  I do not believe this is a sinus infection.  I will give her Tussionex to take as needed for cough.  I recommended tincture  of time for 3 to 4 days.  If worsening.  If she develops severe pain in her frontal or maxillary sinuses, I will use Augmentin for secondary sinusitis the right now I believe her symptoms are due to an upper respiratory infection

## 2022-09-02 ENCOUNTER — Inpatient Hospital Stay: Payer: BC Managed Care – PPO | Attending: Hematology and Oncology | Admitting: Hematology and Oncology

## 2022-09-02 VITALS — BP 178/106 | HR 91 | Temp 97.9°F | Resp 15 | Wt 179.8 lb

## 2022-09-02 DIAGNOSIS — Z79810 Long term (current) use of selective estrogen receptor modulators (SERMs): Secondary | ICD-10-CM | POA: Diagnosis not present

## 2022-09-02 DIAGNOSIS — D0501 Lobular carcinoma in situ of right breast: Secondary | ICD-10-CM | POA: Diagnosis not present

## 2022-09-02 DIAGNOSIS — D0502 Lobular carcinoma in situ of left breast: Secondary | ICD-10-CM | POA: Insufficient documentation

## 2022-09-02 MED ORDER — TAMOXIFEN CITRATE 20 MG PO TABS: 20.0000 mg | ORAL_TABLET | Freq: Every day | ORAL | 3 refills | Status: DC

## 2022-09-02 NOTE — Assessment & Plan Note (Signed)
Risk of invasive and noninvasive breast cancer with LCIS is 1% per year. Her cumulative risk lifetime would be around 30% %. Tamoxifen would reduce this risk by half. This is based on NSABP P-1 clinical trial   Risk reduction strategies: 1. Tamoxifen 20 mg daily.   Breast Cancer Surveillance: 1. Mammogram 11/16/2021: Benign, density Cat C 2. Breast MRI 12/13/2021: Benign breast density category C 3.  Breast exam 09/02/2022: Benign  Tamoxifen toxicities: 1. Hot flashes: Mild to moderate  Return to clinic in 1 year for follow-up

## 2022-09-02 NOTE — Progress Notes (Signed)
Patient Care Team: Susy Frizzle, MD as PCP - General (Family Medicine)  DIAGNOSIS:  Encounter Diagnosis  Name Primary?   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 She is tolerating the tamoxifen extremely well. She does have some mild hot flashes. She states that she haven't had time for exercise, but she is staying busy.    ALLERGIES:  has No Known Allergies.  MEDICATIONS:  Current Outpatient Medications  Medication Sig Dispense Refill   tamoxifen (NOLVADEX) 20 MG tablet Take 1 tablet (20 mg total) by mouth daily. 90 tablet 3   No current facility-administered medications for this visit.    PHYSICAL EXAMINATION: ECOG PERFORMANCE STATUS: 1 - Symptomatic but completely ambulatory  Vitals:   09/02/22 1506  BP: (!) 178/106  Pulse: 91  Resp: 15  Temp: 97.9 F (36.6 C)  SpO2: 98%   Filed Weights   09/02/22 1506  Weight: 179 lb 12.8 oz (81.6 kg)    BREAST: No palpable masses or nodules in either right or left breasts. No palpable axillary supraclavicular or infraclavicular adenopathy no breast tenderness or nipple discharge. (exam performed in the presence of a chaperone)  LABORATORY DATA:  I have reviewed the data as listed    Latest Ref Rng & Units 03/13/2018   10:36 AM 09/30/2016    9:58 AM 12/05/2015   10:35 AM  CMP  Glucose 65 - 99 mg/dL 83  90  106   BUN 7 - 25 mg/dL 16  14  12    Creatinine 0.50 - 1.05 mg/dL 0.78  0.73  0.76   Sodium 135 - 146  mmol/L 141  141  135   Potassium 3.5 - 5.3 mmol/L 4.4  3.9  3.7   Chloride 98 - 110 mmol/L 107  105  100   CO2 20 - 32 mmol/L 27  29  26    Calcium 8.6 - 10.4 mg/dL 9.5  9.6  8.7   Total Protein 6.1 - 8.1 g/dL 7.0  6.7  6.6   Total Bilirubin 0.2 - 1.2 mg/dL 0.5  0.4  0.3   Alkaline Phos 33 - 130 U/L  72  64   AST 10 - 35 U/L 16  15  20    ALT 6 - 29 U/L 17  13  13      Lab Results  Component Value Date   WBC 4.8 03/13/2018   HGB 12.5 03/13/2018   HCT 37.5 03/13/2018   MCV 83.5 03/13/2018   PLT 320 03/13/2018   NEUTROABS 2,587 03/13/2018    ASSESSMENT & PLAN:  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 Her cumulative risk lifetime would be around 30% %. Tamoxifen would reduce this risk by half. This is based on NSABP P-1 clinical trial   Risk reduction strategies: 1. Tamoxifen 20 mg daily.   Breast Cancer Surveillance: 1. Mammogram 11/16/2021: Benign, density Cat C 2. Breast MRI  12/13/2021: Benign breast density category C 3.  Breast exam 09/02/2022: Benign  Tamoxifen toxicities: 1. Hot flashes: Mild to moderate  Return to clinic in 1 year for follow-up    No orders of the defined types were placed in this encounter.  The patient has a good understanding of the overall plan. she agrees with it. she will call with any problems that may develop before the next visit here. Total time spent: 30 mins including face to face time and time spent for planning, charting and co-ordination of care   Harriette Ohara, MD 09/02/22    I Gardiner Coins am acting as a Education administrator for [employer]  I have reviewed the above documentation for accuracy and completeness, and I agree with the above.

## 2022-09-16 ENCOUNTER — Ambulatory Visit: Payer: BC Managed Care – PPO | Admitting: Family Medicine

## 2022-09-16 ENCOUNTER — Encounter: Payer: Self-pay | Admitting: Family Medicine

## 2022-09-16 VITALS — BP 156/98 | HR 77 | Temp 98.7°F | Ht 67.0 in | Wt 181.0 lb

## 2022-09-16 DIAGNOSIS — D509 Iron deficiency anemia, unspecified: Secondary | ICD-10-CM | POA: Insufficient documentation

## 2022-09-16 DIAGNOSIS — I10 Essential (primary) hypertension: Secondary | ICD-10-CM | POA: Diagnosis not present

## 2022-09-16 DIAGNOSIS — Z1211 Encounter for screening for malignant neoplasm of colon: Secondary | ICD-10-CM | POA: Insufficient documentation

## 2022-09-16 DIAGNOSIS — K5900 Constipation, unspecified: Secondary | ICD-10-CM | POA: Insufficient documentation

## 2022-09-16 MED ORDER — HYDROCHLOROTHIAZIDE 25 MG PO TABS: 25.0000 mg | ORAL_TABLET | Freq: Every day | ORAL | 3 refills | Status: DC

## 2022-09-16 NOTE — Progress Notes (Signed)
Subjective:    Patient ID: Autumn Patel, female    DOB: [birth year], 62 y.o.   MRN: [id number]  Patient's blood pressure has been elevated recently with systolic blood pressures between 150 and [id number] and diastolic blood pressures between 90 and 100.  This was first seen at her gynecologist and was confirmed today.  She also reports a dull headache.  She denies any changes in her medication.  She denies any stimulant use.  She denies any sodium consumption beyond normal.  She denies any anxiety or pain. Past Medical History:  Diagnosis Date   Allergy    Family history of breast cancer    Family history of leukemia    Hypertension    Past Surgical History:  Procedure Laterality Date   BREAST CYST EXCISION Left    BREAST LUMPECTOMY WITH RADIOACTIVE SEED LOCALIZATION Bilateral 03/22/2020   Procedure: BILATERAL BREAST LUMPECTOMY WITH RADIOACTIVE SEED LOCALIZATION;  Surgeon: Donnie Mesa, MD;  Location: [place];  Service: General;  Laterality: Bilateral;   CESAREAN SECTION     CHOLECYSTECTOMY     Current Outpatient Medications on File Prior to Visit  Medication Sig Dispense Refill   tamoxifen (NOLVADEX) 20 MG tablet Take 1 tablet (20 mg total) by mouth daily. 90 tablet 3   No current facility-administered medications on file prior to visit.     No Known Allergies Social History   Socioeconomic History   Marital status: Married    Spouse name: Not on file   Number of children: Not on file   Years of education: Not on file   Highest education level: Not on file  Occupational History   Not on file  Tobacco Use   Smoking status: Never   Smokeless tobacco: Never  Substance and Sexual Activity   Alcohol use: No   Drug use: No   Sexual activity: Yes    Comment: married  Other Topics Concern   Not on file  Social History Narrative   Not on file   Social Determinants of Health   Financial Resource Strain: Not on file  Food Insecurity: Not on file   Transportation Needs: Not on file  Physical Activity: Not on file  Stress: Not on file  Social Connections: Not on file  Intimate Partner Violence: Not on file     Review of Systems  All other systems reviewed and are negative.      Objective:   Physical Exam Vitals reviewed.  Constitutional:      General: She is not in acute distress.    Appearance: She is well-developed. She is not diaphoretic.  HENT:     Nose:     Right Sinus: No maxillary sinus tenderness or frontal sinus tenderness.     Left Sinus: No maxillary sinus tenderness or frontal sinus tenderness.  Cardiovascular:     Rate and Rhythm: Normal rate and regular rhythm.     Heart sounds: Normal heart sounds. No murmur heard. Pulmonary:     Effort: Pulmonary effort is normal. No respiratory distress.     Breath sounds: Normal breath sounds. No wheezing or rales.  Chest:     Chest wall: No tenderness.  Abdominal:     General: Bowel sounds are normal.     Palpations: Abdomen is soft.  Musculoskeletal:     Cervical back: Neck supple.  Lymphadenopathy:     Cervical: No cervical adenopathy.           Assessment & Plan:  Benign  essential HTN - Plan: BASIC METABOLIC PANEL WITH GFR Begin hydrochlorothiazide 25 mg daily and recheck blood pressure in 1 to 2 weeks.  Check renal function with a BMP.

## 2022-09-17 LAB — BASIC METABOLIC PANEL WITHOUT GFR
BUN: 12 mg/dL (ref 7–25)
CO2: 25 mmol/L (ref 20–32)
Calcium: 8.9 mg/dL (ref 8.6–10.4)
Chloride: 107 mmol/L (ref 98–110)
Creat: 0.7 mg/dL (ref 0.50–1.05)
Glucose, Bld: 117 mg/dL — ABNORMAL HIGH (ref 65–99)
Potassium: 4 mmol/L (ref 3.5–5.3)
Sodium: 143 mmol/L (ref 135–146)
eGFR: 99 mL/min/1.73m2 (ref >=60)

## 2022-10-04 ENCOUNTER — Other Ambulatory Visit: Payer: BC Managed Care – PPO

## 2022-10-04 DIAGNOSIS — D0501 Lobular carcinoma in situ of right breast: Secondary | ICD-10-CM

## 2022-10-04 DIAGNOSIS — Z13220 Encounter for screening for lipoid disorders: Secondary | ICD-10-CM

## 2022-10-04 DIAGNOSIS — I10 Essential (primary) hypertension: Secondary | ICD-10-CM

## 2022-10-05 LAB — CBC WITH DIFFERENTIAL/PLATELET
Absolute Monocytes: 447 cells/uL (ref 200–950)
Basophils Absolute: 52 cells/uL (ref 0–200)
Basophils Relative: 0.9 %
Eosinophils Absolute: 168 cells/uL (ref 15–500)
Eosinophils Relative: 2.9 %
HCT: 40.7 % (ref 35.0–45.0)
Hemoglobin: 13.5 g/dL (ref 11.7–15.5)
Lymphs Abs: 2065 cells/uL (ref 850–3900)
MCH: 28.5 pg (ref 27.0–33.0)
MCHC: 33.2 g/dL (ref 32.0–36.0)
MCV: 85.9 fL (ref 80.0–100.0)
MPV: 9.8 fL (ref 7.5–12.5)
Monocytes Relative: 7.7 %
Neutro Abs: 3068 cells/uL (ref 1500–7800)
Neutrophils Relative %: 52.9 %
Platelets: 297 10*3/uL (ref 140–400)
RBC: 4.74 10*6/uL (ref 3.80–5.10)
RDW: 12.7 % (ref 11.0–15.0)
Total Lymphocyte: 35.6 %
WBC: 5.8 10*3/uL (ref 3.8–10.8)

## 2022-10-05 LAB — LIPID PANEL
Cholesterol: 207 mg/dL — ABNORMAL HIGH (ref <200)
HDL: 79 mg/dL (ref >=50)
LDL Cholesterol (Calc): 101 mg/dL (calc) — ABNORMAL HIGH
Non-HDL Cholesterol (Calc): 128 mg/dL (calc) (ref <130)
Total CHOL/HDL Ratio: 2.6 (calc) (ref <5.0)
Triglycerides: 176 mg/dL — ABNORMAL HIGH (ref <150)

## 2022-10-05 LAB — COMPLETE METABOLIC PANEL WITH GFR
AG Ratio: 1.4 (calc) (ref 1.0–2.5)
ALT: 9 U/L (ref 6–29)
AST: 14 U/L (ref 10–35)
Albumin: 4 g/dL (ref 3.6–5.1)
Alkaline phosphatase (APISO): 68 U/L (ref 37–153)
BUN: 16 mg/dL (ref 7–25)
CO2: 30 mmol/L (ref 20–32)
Calcium: 9.1 mg/dL (ref 8.6–10.4)
Chloride: 105 mmol/L (ref 98–110)
Creat: 0.74 mg/dL (ref 0.50–1.05)
Globulin: 2.9 g/dL (calc) (ref 1.9–3.7)
Glucose, Bld: 85 mg/dL (ref 65–99)
Potassium: 4.4 mmol/L (ref 3.5–5.3)
Sodium: 142 mmol/L (ref 135–146)
Total Bilirubin: 0.3 mg/dL (ref 0.2–1.2)
Total Protein: 6.9 g/dL (ref 6.1–8.1)
eGFR: 93 mL/min/{1.73_m2} (ref >=60)

## 2022-10-14 ENCOUNTER — Encounter: Payer: Self-pay | Admitting: Family Medicine

## 2022-10-14 ENCOUNTER — Ambulatory Visit (INDEPENDENT_AMBULATORY_CARE_PROVIDER_SITE_OTHER): Payer: BC Managed Care – PPO | Admitting: Family Medicine

## 2022-10-14 VITALS — BP 136/87 | HR 72 | Ht 67.0 in | Wt 182.0 lb

## 2022-10-14 DIAGNOSIS — Z1211 Encounter for screening for malignant neoplasm of colon: Secondary | ICD-10-CM | POA: Diagnosis not present

## 2022-10-14 DIAGNOSIS — Z23 Encounter for immunization: Secondary | ICD-10-CM | POA: Diagnosis not present

## 2022-10-14 DIAGNOSIS — Z0001 Encounter for general adult medical examination with abnormal findings: Secondary | ICD-10-CM

## 2022-10-14 DIAGNOSIS — I10 Essential (primary) hypertension: Secondary | ICD-10-CM

## 2022-10-14 DIAGNOSIS — Z0000 Encounter for general adult medical examination without abnormal findings: Secondary | ICD-10-CM

## 2022-10-14 MED ORDER — PHENTERMINE HCL 37.5 MG PO CAPS: 37.5000 mg | ORAL_CAPSULE | ORAL | 2 refills | Status: DC

## 2022-10-14 NOTE — Progress Notes (Signed)
Subjective:    Patient ID: Autumn Patel, female    DOB: 12-Jun-1961, 62 y.o.   MRN: [id number] Patient is a very pleasant 62 year old Caucasian female who presents today for physical exam.  She is requesting phentermine for weight loss.  We discussed the risk of cardiovascular disease due to high blood pressure however her blood pressure is well-controlled and she is monitoring this daily.  She states that she needs assistance with weight loss and she is willing to accept the slight risk associated with taking phentermine.  She denies any chest pain or shortness of breath or dyspnea on exertion.  She is due for mammogram in April.  Her last colonoscopy was in 2014.  This is due again this year.  She has a grandchild about to be born.  I recommended RSV vaccine this fall.  She is also due for a tetanus shot and I recommended getting the Tdap to protect against whooping cough and update her tetanus status.  Her Pap smear is performed by her gynecologist.  Her most recent lab work is listed below Lab on 10/04/2022  Component Date Value Ref Range Status   WBC 10/04/2022 5.8  3.8 - 10.8 Thousand/uL Final   RBC 10/04/2022 4.74  3.80 - 5.10 Million/uL Final   Hemoglobin 10/04/2022 13.5  11.7 - 15.5 g/dL Final   HCT 10/04/2022 40.7  35.0 - 45.0 % Final   MCV 10/04/2022 85.9  80.0 - 100.0 fL Final   MCH 10/04/2022 28.5  27.0 - 33.0 pg Final   MCHC 10/04/2022 33.2  32.0 - 36.0 g/dL Final   RDW 10/04/2022 12.7  11.0 - 15.0 % Final   Platelets 10/04/2022 297  140 - 400 Thousand/uL Final   MPV 10/04/2022 9.8  7.5 - 12.5 fL Final   Neutro Abs 10/04/2022 3,068  1,500 - 7,800 cells/uL Final   Lymphs Abs 10/04/2022 2,065  850 - 3,900 cells/uL Final   Absolute Monocytes 10/04/2022 447  200 - 950 cells/uL Final   Eosinophils Absolute 10/04/2022 168  15 - 500 cells/uL Final   Basophils Absolute 10/04/2022 52  0 - 200 cells/uL Final   Neutrophils Relative % 10/04/2022 52.9  % Final   Total Lymphocyte 10/04/2022  35.6  % Final   Monocytes Relative 10/04/2022 7.7  % Final   Eosinophils Relative 10/04/2022 2.9  % Final   Basophils Relative 10/04/2022 0.9  % Final   Glucose, Bld 10/04/2022 85  65 - 99 mg/dL Final   Comment: .            Fasting reference interval .    BUN 10/04/2022 16  7 - 25 mg/dL Final   Creat 10/04/2022 0.74  0.50 - 1.05 mg/dL Final   eGFR 10/04/2022 93  > OR = 60 mL/min/1.41m Final   BUN/Creatinine Ratio 10/04/2022 SEE NOTE:  6 - 22 (calc) Final   Comment:    Not Reported: BUN and Creatinine are within    reference range. .    Sodium 10/04/2022 142  135 - 146 mmol/L Final   Potassium 10/04/2022 4.4  3.5 - 5.3 mmol/L Final   Chloride 10/04/2022 105  98 - 110 mmol/L Final   CO2 10/04/2022 30  20 - 32 mmol/L Final   Calcium 10/04/2022 9.1  8.6 - 10.4 mg/dL Final   Total Protein 10/04/2022 6.9  6.1 - 8.1 g/dL Final   Albumin 10/04/2022 4.0  3.6 - 5.1 g/dL Final   Globulin 10/04/2022 2.9  1.9 - 3.7  g/dL (calc) Final   AG Ratio 10/04/2022 1.4  1.0 - 2.5 (calc) Final   Total Bilirubin 10/04/2022 0.3  0.2 - 1.2 mg/dL Final   Alkaline phosphatase (APISO) 10/04/2022 68  37 - 153 U/L Final   AST 10/04/2022 14  10 - 35 U/L Final   ALT 10/04/2022 9  6 - 29 U/L Final   Cholesterol 10/04/2022 207 (H)  <200 mg/dL Final   HDL 10/04/2022 79  > OR = 50 mg/dL Final   Triglycerides 10/04/2022 176 (H)  <150 mg/dL Final   LDL Cholesterol (Calc) 10/04/2022 101 (H)  mg/dL (calc) Final   Comment: Reference range: <100 . Desirable range <100 mg/dL for primary prevention;   <70 mg/dL for patients with CHD or diabetic patients  with > or = 2 CHD risk factors. Marland Kitchen LDL-C is now calculated using the Martin-Hopkins  calculation, which is a validated novel method providing  better accuracy than the Friedewald equation in the  estimation of LDL-C.  Cresenciano Genre et al. Annamaria Helling. [id number]): 2061-2068  (http://education.[facility].com/faq/FAQ164)    Total CHOL/HDL Ratio 10/04/2022 2.6  <5.0  (calc) Final   Non-HDL Cholesterol (Calc) 10/04/2022 128  <130 mg/dL (calc) Final   Comment: For patients with diabetes plus 1 major ASCVD risk  factor, treating to a non-HDL-C goal of <100 mg/dL  (LDL-C of <70 mg/dL) is considered a therapeutic  option.   Office Visit on 09/16/2022  Component Date Value Ref Range Status   Glucose, Bld 09/16/2022 117 (H)  65 - 99 mg/dL Final   Comment: .            Fasting reference interval . For someone without known diabetes, a glucose value between 100 and 125 mg/dL is consistent with prediabetes and should be confirmed with a follow-up test. .    BUN 09/16/2022 12  7 - 25 mg/dL Final   Creat 09/16/2022 0.70  0.50 - 1.05 mg/dL Final   eGFR 09/16/2022 99  > OR = 60 mL/min/1.1m Final   BUN/Creatinine Ratio 09/16/2022 SEE NOTE:  6 - 22 (calc) Final   Comment:    Not Reported: BUN and Creatinine are within    reference range. .    Sodium 09/16/2022 143  135 - 146 mmol/L Final   Potassium 09/16/2022 4.0  3.5 - 5.3 mmol/L Final   Chloride 09/16/2022 107  98 - 110 mmol/L Final   CO2 09/16/2022 25  20 - 32 mmol/L Final   Calcium 09/16/2022 8.9  8.6 - 10.4 mg/dL Final    Past Medical History:  Diagnosis Date   Allergy    Family history of breast cancer    Family history of leukemia    Hypertension    Past Surgical History:  Procedure Laterality Date   BREAST CYST EXCISION Left    BREAST LUMPECTOMY WITH RADIOACTIVE SEED LOCALIZATION Bilateral 03/22/2020   Procedure: BILATERAL BREAST LUMPECTOMY WITH RADIOACTIVE SEED LOCALIZATION;  Surgeon: TDonnie Mesa MD;  Location: [facility]  Service: General;  Laterality: Bilateral;   CESAREAN SECTION     CHOLECYSTECTOMY     Current Outpatient Medications on File Prior to Visit  Medication Sig Dispense Refill   hydrochlorothiazide (HYDRODIURIL) 25 MG tablet Take 1 tablet (25 mg total) by mouth daily. 90 tablet 3   tamoxifen (NOLVADEX) 20 MG tablet Take 1 tablet (20 mg total) by  mouth daily. 90 tablet 3   No current facility-administered medications on file prior to visit.     No Known Allergies  Social History   Socioeconomic History   Marital status: Married    Spouse name: Not on file   Number of children: Not on file   Years of education: Not on file   Highest education level: Not on file  Occupational History   Not on file  Tobacco Use   Smoking status: Never   Smokeless tobacco: Never  Substance and Sexual Activity   Alcohol use: No   Drug use: No   Sexual activity: Yes    Comment: married  Other Topics Concern   Not on file  Social History Narrative   Not on file   Social Determinants of Health   Financial Resource Strain: Not on file  Food Insecurity: Not on file  Transportation Needs: Not on file  Physical Activity: Not on file  Stress: Not on file  Social Connections: Not on file  Intimate Partner Violence: Not on file     Review of Systems  All other systems reviewed and are negative.      Objective:   Physical Exam Vitals reviewed.  Constitutional:      General: She is not in acute distress.    Appearance: Normal appearance. She is well-developed and normal weight. She is not ill-appearing, toxic-appearing or diaphoretic.  HENT:     Head: Normocephalic and atraumatic.     Right Ear: Tympanic membrane and ear canal normal.     Left Ear: Tympanic membrane and ear canal normal.     Nose: Nose normal. No congestion or rhinorrhea.     Right Sinus: No maxillary sinus tenderness or frontal sinus tenderness.     Left Sinus: No maxillary sinus tenderness or frontal sinus tenderness.     Mouth/Throat:     Mouth: Mucous membranes are moist.     Pharynx: Oropharynx is clear. No oropharyngeal exudate or posterior oropharyngeal erythema.  Eyes:     Extraocular Movements: Extraocular movements intact.     Conjunctiva/sclera: Conjunctivae normal.     Pupils: Pupils are equal, round, and reactive to light.  Neck:     Vascular: No  carotid bruit.  Cardiovascular:     Rate and Rhythm: Normal rate and regular rhythm.     Heart sounds: Normal heart sounds. No murmur heard.    No friction rub.  Pulmonary:     Effort: Pulmonary effort is normal. No respiratory distress.     Breath sounds: Normal breath sounds. No wheezing or rales.  Chest:     Chest wall: No tenderness.  Abdominal:     General: Bowel sounds are normal. There is no distension.     Palpations: Abdomen is soft. There is no mass.     Tenderness: There is no abdominal tenderness. There is no right CVA tenderness, left CVA tenderness, guarding or rebound.     Hernia: No hernia is present.  Musculoskeletal:     Cervical back: Neck supple. No rigidity.     Right lower leg: No edema.     Left lower leg: No edema.  Lymphadenopathy:     Cervical: No cervical adenopathy.  Skin:    General: Skin is warm.     Coloration: Skin is not jaundiced or pale.     Findings: No bruising, erythema, lesion or rash.  Neurological:     General: No focal deficit present.     Mental Status: She is alert and oriented to person, place, and time. Mental status is at baseline.     Cranial Nerves: No cranial nerve  deficit.     Sensory: No sensory deficit.     Motor: No weakness.     Coordination: Coordination normal.     Gait: Gait normal.     Deep Tendon Reflexes: Reflexes normal.  Psychiatric:        Mood and Affect: Mood normal.        Behavior: Behavior normal.        Thought Content: Thought content normal.        Judgment: Judgment normal.           Assessment & Plan:  Colon cancer screening - Plan: Ambulatory referral to Gastroenterology  General medical exam  Benign essential HTN Patient received her Tdap today.  I recommended an RSV vaccine this fall.  Lab work is outstanding.  I recommended she schedule her mammogram.  I will schedule her for a colonoscopy.  Pap smear will be deferred to her gynecologist.  We will defer a bone density test until 66.   Blood pressure is now well-controlled.  We will try the patient on phentermine 37.5 mg p.o. every morning for 3 months

## 2023-02-05 ENCOUNTER — Telehealth: Payer: Self-pay | Admitting: Family Medicine

## 2023-02-05 NOTE — Telephone Encounter (Signed)
Patient called to follow up on previous conversation with provider regarding a medication provider recommended. Patient has done some research and has questions. Requesting call back.  Please advise at [phone]

## 2023-02-07 NOTE — Telephone Encounter (Signed)
Spoke w/pt. Per pt previous conversation was regarding wt lost. Per pt went to web site per pcp recommendation, it was for semaglutide.  Per pt wanted to know if you think Zepound would be good for her to take for wt? Pls advice?

## 2023-02-11 NOTE — Telephone Encounter (Signed)
LVM for pt to return call. Need to pharmacy info to send in Zepound

## 2023-04-08 ENCOUNTER — Encounter: Payer: Self-pay | Admitting: Family Medicine

## 2023-04-08 ENCOUNTER — Ambulatory Visit: Payer: BC Managed Care – PPO | Admitting: Family Medicine

## 2023-04-08 VITALS — BP 132/82 | HR 72 | Temp 98.1°F | Ht 67.0 in | Wt 178.0 lb

## 2023-04-08 DIAGNOSIS — S39012A Strain of muscle, fascia and tendon of lower back, initial encounter: Secondary | ICD-10-CM | POA: Diagnosis not present

## 2023-04-08 MED ORDER — MELOXICAM 15 MG PO TABS: 15.0000 mg | ORAL_TABLET | Freq: Every day | ORAL | 0 refills | Status: DC

## 2023-04-08 MED ORDER — CYCLOBENZAPRINE HCL 10 MG PO TABS: 10.0000 mg | ORAL_TABLET | Freq: Three times a day (TID) | ORAL | 0 refills | Status: DC | PRN

## 2023-04-08 NOTE — Progress Notes (Signed)
Subjective:  HPI: Autumn Patel is a 62 y.o. female presenting on 04/08/2023 for Acute Visit   HPI Patient is in today for low back muscular pain in the mid to right area for 2 days since a night out dancing Saturday night. She denies pain, weakness, or numbness in her legs, saddle numbness, fecal or urinary incontinence. No recent falls or known injury. She endorses pain that is worse with standing, twisting, and walking.  Review of Systems  All other systems reviewed and are negative.   Relevant past medical history reviewed and updated as indicated.   Past Medical History:  Diagnosis Date   Allergy    Family history of breast cancer    Family history of leukemia    Hypertension      Past Surgical History:  Procedure Laterality Date   BREAST CYST EXCISION Left    BREAST LUMPECTOMY WITH RADIOACTIVE SEED LOCALIZATION Bilateral 03/22/2020   Procedure: BILATERAL BREAST LUMPECTOMY WITH RADIOACTIVE SEED LOCALIZATION;  Surgeon: Manus Rudd, MD;  Location: [facility];  Service: General;  Laterality: Bilateral;   CESAREAN SECTION     CHOLECYSTECTOMY      Allergies and medications reviewed and updated.   Current Outpatient Medications:    cyclobenzaprine (FLEXERIL) 10 MG tablet, Take 1 tablet (10 mg total) by mouth every 8 (eight) hours as needed for muscle spasms., Disp: 30 tablet, Rfl: 0   hydrochlorothiazide (HYDRODIURIL) 25 MG tablet, Take 1 tablet (25 mg total) by mouth daily., Disp: 90 tablet, Rfl: 3   meloxicam (MOBIC) 15 MG tablet, Take 1 tablet (15 mg total) by mouth daily., Disp: 30 tablet, Rfl: 0   tamoxifen (NOLVADEX) 20 MG tablet, Take 1 tablet (20 mg total) by mouth daily., Disp: 90 tablet, Rfl: 3   phentermine 37.5 MG capsule, Take 1 capsule (37.5 mg total) by mouth every morning. (Patient not taking: Reported on 04/08/2023), Disp: 30 capsule, Rfl: 2  No Known Allergies  Objective:   BP 132/82   Pulse 72   Temp 98.1 F (36.7 C) (Oral)   Ht 5'  7" (1.702 m)   Wt 178 lb (80.7 kg)   LMP  (LMP Unknown) Comment: LMP > 1 year  SpO2 97%   BMI 27.88 kg/m      04/08/2023   10:24 AM 10/14/2022    2:57 PM 10/14/2022    2:52 PM  Vitals with BMI  Height 5\' 7"   5\' 7"   Weight 178 lbs  182 lbs  BMI 27.87  28.5  Systolic 132 136 161  Diastolic 82 87 84  Pulse 72 72 76     Physical Exam Vitals and nursing note reviewed.  Constitutional:      Appearance: Normal appearance. She is normal weight.  HENT:     Head: Normocephalic and atraumatic.  Musculoskeletal:     Thoracic back: Normal. No bony tenderness.     Lumbar back: Normal. No bony tenderness.  Skin:    General: Skin is warm and dry.  Neurological:     General: No focal deficit present.     Mental Status: She is alert and oriented to person, place, and time. Mental status is at baseline.     Sensory: Sensation is intact.     Motor: Motor function is intact.     Coordination: Coordination is intact.  Psychiatric:        Mood and Affect: Mood normal.        Behavior: Behavior normal.  Thought Content: Thought content normal.        Judgment: Judgment normal.     Assessment & Plan:  Strain of lumbar region, initial encounter Assessment & Plan: Symptoms consistent with lumbar strain without red flags on exam or symptoms of radiculopathy. Start Flexeril 10mg  TID PRN and Meloxicam 15mg  daily. Return to office if symptoms persist or worsen.   Other orders -     Cyclobenzaprine HCl; Take 1 tablet (10 mg total) by mouth every 8 (eight) hours as needed for muscle spasms.  Dispense: 30 tablet; Refill: 0 -     Meloxicam; Take 1 tablet (15 mg total) by mouth daily.  Dispense: 30 tablet; Refill: 0     Follow up plan: Return if symptoms worsen or fail to improve.  Park Meo, FNP

## 2023-04-08 NOTE — Assessment & Plan Note (Signed)
Symptoms consistent with lumbar strain without red flags on exam or symptoms of radiculopathy. Start Flexeril 10mg  TID PRN and Meloxicam 15mg  daily. Return to office if symptoms persist or worsen.

## 2023-05-05 ENCOUNTER — Other Ambulatory Visit: Payer: Self-pay | Admitting: Family Medicine

## 2023-05-07 ENCOUNTER — Other Ambulatory Visit: Payer: Self-pay

## 2023-05-07 DIAGNOSIS — S39012S Strain of muscle, fascia and tendon of lower back, sequela: Secondary | ICD-10-CM

## 2023-05-07 DIAGNOSIS — S39012A Strain of muscle, fascia and tendon of lower back, initial encounter: Secondary | ICD-10-CM

## 2023-05-07 MED ORDER — MELOXICAM 15 MG PO TABS: 15.0000 mg | ORAL_TABLET | Freq: Every day | ORAL | 0 refills | Status: DC

## 2023-08-04 ENCOUNTER — Telehealth: Payer: Self-pay | Admitting: Hematology and Oncology

## 2023-08-04 NOTE — Telephone Encounter (Signed)
Left patient a voicemail in regards to rescheduled appointment times/dates due to provider being in [facility]

## 2023-08-26 LAB — COLOGUARD: Cologuard: NEGATIVE

## 2023-09-03 ENCOUNTER — Ambulatory Visit: Payer: BC Managed Care – PPO | Admitting: Hematology and Oncology

## 2023-09-05 ENCOUNTER — Telehealth: Payer: Self-pay | Admitting: Family Medicine

## 2023-09-05 MED ORDER — HYDROCHLOROTHIAZIDE 25 MG PO TABS: 25.0000 mg | ORAL_TABLET | Freq: Every day | ORAL | 3 refills | Status: DC

## 2023-09-05 NOTE — Telephone Encounter (Signed)
Prescription Request  09/05/2023  LOV: 10/14/2022  What is the name of the medication or equipment?   hydrochlorothiazide (HYDRODIURIL) 25 MG tablet  **original script was for 90 days**  Have you contacted your pharmacy to request a refill? Yes   Which pharmacy would you like this sent to?  [facility] [address] [place] - [address] [place] [address] Phone: [phone] Fax: [phone]    Patient notified that their request is being sent to the clinical staff for review and that they should receive a response within 2 business days.   Please advise pharmacist.

## 2023-09-18 ENCOUNTER — Other Ambulatory Visit: Payer: Self-pay | Admitting: *Deleted

## 2023-09-18 DIAGNOSIS — D0501 Lobular carcinoma in situ of right breast: Secondary | ICD-10-CM

## 2023-09-18 NOTE — Progress Notes (Signed)
Received call from pt stating she needs orders placed for yearly mammogram.  Per MD request orders placed.

## 2023-09-22 ENCOUNTER — Ambulatory Visit
Admission: RE | Admit: 2023-09-22 | Discharge: 2023-09-22 | Disposition: A | Discharge status: Home / Self Care | Payer: 59 | Source: Ambulatory Visit | Attending: Hematology and Oncology

## 2023-09-22 DIAGNOSIS — D0501 Lobular carcinoma in situ of right breast: Secondary | ICD-10-CM

## 2023-09-29 ENCOUNTER — Ambulatory Visit: Payer: Self-pay

## 2023-09-29 ENCOUNTER — Inpatient Hospital Stay: Payer: 59 | Attending: Hematology and Oncology | Admitting: Hematology and Oncology

## 2023-09-29 ENCOUNTER — Other Ambulatory Visit: Payer: Self-pay

## 2023-09-29 VITALS — BP 128/88 | HR 85 | Temp 98.6°F | Resp 18 | Ht 67.0 in | Wt 180.0 lb

## 2023-09-29 DIAGNOSIS — N951 Menopausal and female climacteric states: Secondary | ICD-10-CM | POA: Insufficient documentation

## 2023-09-29 DIAGNOSIS — D0502 Lobular carcinoma in situ of left breast: Secondary | ICD-10-CM | POA: Insufficient documentation

## 2023-09-29 DIAGNOSIS — Z79810 Long term (current) use of selective estrogen receptor modulators (SERMs): Secondary | ICD-10-CM | POA: Insufficient documentation

## 2023-09-29 DIAGNOSIS — D0501 Lobular carcinoma in situ of right breast: Secondary | ICD-10-CM | POA: Insufficient documentation

## 2023-09-29 MED ORDER — TAMOXIFEN CITRATE 20 MG PO TABS: 20.0000 mg | ORAL_TABLET | Freq: Every day | ORAL | 3 refills | Status: AC

## 2023-09-29 NOTE — Progress Notes (Signed)
 Patient Care Team: Donita Brooks, MD as PCP - General (Family Medicine)  DIAGNOSIS:  Encounter Diagnosis  Name Primary?   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 She has been on tamoxifen for three and a half years without any known side effects. She denies experiencing hot flashes, mood swings, irritability, or vaginal spotting. Her last mammogram was performed last week, which showed a category C breast density. The patient also reports taking hydrochlorothiazide daily, but denies taking phentermine, Flexeril, or meloxicam.     ALLERGIES:  has no known allergies.  MEDICATIONS:  Current Outpatient Medications  Medication Sig Dispense Refill   hydrochlorothiazide (HYDRODIURIL) 25 MG tablet Take 1 tablet (25 mg total) by mouth daily. 90 tablet 3   tamoxifen (NOLVADEX) 20 MG tablet Take 1 tablet (20 mg total) by mouth daily. 90 tablet 3   No current facility-administered medications for this visit.    PHYSICAL EXAMINATION: ECOG PERFORMANCE STATUS: 1 - Symptomatic but completely ambulatory  Vitals:   09/29/23 1517  BP: 128/88  Pulse: 85  Resp: 18  Temp: 98.6 F (37 C)  SpO2: 98%   Filed Weights   09/29/23 1517  Weight: 180 lb (81.6 kg)    Physical Exam BREAST: Breasts normal  (exam performed in the presence of a chaperone)  LABORATORY DATA:  I have reviewed the data as listed    Latest Ref Rng & Units 10/04/2022    9:40 AM 09/16/2022    4:03 PM 03/13/2018   10:36 AM   CMP  Glucose 65 - 99 mg/dL 85  782  83   BUN 7 - 25 mg/dL 16  12  16    Creatinine 0.50 - 1.05 mg/dL 9.56  2.13  0.86   Sodium 135 - 146 mmol/L 142  143  141   Potassium 3.5 - 5.3 mmol/L 4.4  4.0  4.4   Chloride 98 - 110 mmol/L 105  107  107   CO2 20 - 32 mmol/L 30  25  27    Calcium 8.6 - 10.4 mg/dL 9.1  8.9  9.5   Total Protein 6.1 - 8.1 g/dL 6.9   7.0   Total Bilirubin 0.2 - 1.2 mg/dL 0.3   0.5   AST 10 - 35 U/L 14   16   ALT 6 - 29 U/L 9   17     Lab Results  Component Value Date   WBC 5.8 10/04/2022   HGB 13.5 10/04/2022   HCT 40.7 10/04/2022   MCV 85.9 10/04/2022   PLT 297 10/04/2022   NEUTROABS 3,068 10/04/2022    ASSESSMENT & PLAN:  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 Her cumulative risk lifetime would be around 30% %. Tamoxifen would reduce this risk by half. This is based on NSABP P-1 clinical trial   Risk reduction strategies: 1. Tamoxifen 20 mg daily.   Breast Cancer Surveillance: 1. Mammogram 09/25/2023: Benign, density  Cat C 2. Breast MRI 12/13/2021: Benign breast density category C 3.  Breast exam 09/29/2023: Benign   Tamoxifen toxicities: 1. Hot flashes: Mild to moderate   Return to clinic in 1 year for follow-up ------------------------------------- Assessment and Plan Assessment & Plan Breast Cancer On Tamoxifen for 3.5 years with no reported side effects. Recent mammogram showed category C breast density but no concerning findings. -Continue Tamoxifen. -Physical exam revealed no abnormalities. -Next follow-up in 1 year.  General Health Maintenance Lack of regular exercise. -Encouraged to incorporate 30 minutes of walking into routine at least 3-4 days a week.      No orders of the defined types were placed in this encounter.  The patient has a good understanding of the overall plan. she agrees with it. she will call with any problems that may develop before  the next visit here. Total time spent: 30 mins including face to face time and time spent for planning, charting and co-ordination of care   [name], MD 09/29/23

## 2023-09-29 NOTE — Assessment & Plan Note (Signed)
 Risk of invasive and noninvasive breast cancer with LCIS is 1% per year. Her cumulative risk lifetime would be around 30% %. Tamoxifen would reduce this risk by half. This is based on NSABP P-1 clinical trial   Risk reduction strategies: 1. Tamoxifen 20 mg daily.   Breast Cancer Surveillance: 1. Mammogram 09/25/2023: Benign, density Cat C 2. Breast MRI 12/13/2021: Benign breast density category C 3.  Breast exam 09/29/2023: Benign   Tamoxifen toxicities: 1. Hot flashes: Mild to moderate   Return to clinic in 1 year for follow-up

## 2023-10-13 ENCOUNTER — Other Ambulatory Visit: Payer: Self-pay

## 2023-10-13 ENCOUNTER — Inpatient Hospital Stay (HOSPITAL_COMMUNITY)
Admission: EM | Admit: 2023-10-13 | Discharge: 2023-10-14 | DRG: 502 | Disposition: A | Discharge status: Home / Self Care | Attending: Student | Admitting: Student

## 2023-10-13 ENCOUNTER — Emergency Department (HOSPITAL_COMMUNITY): Admitting: Anesthesiology

## 2023-10-13 ENCOUNTER — Emergency Department (HOSPITAL_COMMUNITY)

## 2023-10-13 ENCOUNTER — Encounter (HOSPITAL_COMMUNITY)
Admission: EM | Disposition: A | Discharge status: Home / Self Care | Payer: Self-pay | Source: Home / Self Care | Attending: Student

## 2023-10-13 ENCOUNTER — Encounter (HOSPITAL_COMMUNITY): Payer: Self-pay

## 2023-10-13 ENCOUNTER — Inpatient Hospital Stay (HOSPITAL_COMMUNITY)

## 2023-10-13 DIAGNOSIS — Z8249 Family history of ischemic heart disease and other diseases of the circulatory system: Secondary | ICD-10-CM | POA: Diagnosis not present

## 2023-10-13 DIAGNOSIS — I10 Essential (primary) hypertension: Secondary | ICD-10-CM | POA: Diagnosis present

## 2023-10-13 DIAGNOSIS — E876 Hypokalemia: Secondary | ICD-10-CM | POA: Diagnosis present

## 2023-10-13 DIAGNOSIS — Z23 Encounter for immunization: Secondary | ICD-10-CM | POA: Diagnosis not present

## 2023-10-13 DIAGNOSIS — S52372B Galeazzi's fracture of left radius, initial encounter for open fracture type I or II: Secondary | ICD-10-CM | POA: Diagnosis present

## 2023-10-13 DIAGNOSIS — Z833 Family history of diabetes mellitus: Secondary | ICD-10-CM | POA: Diagnosis not present

## 2023-10-13 DIAGNOSIS — S56592A Other injury of other extensor muscle, fascia and tendon at forearm level, left arm, initial encounter: Secondary | ICD-10-CM | POA: Diagnosis present

## 2023-10-13 DIAGNOSIS — S5292XC Unspecified fracture of left forearm, initial encounter for open fracture type IIIA, IIIB, or IIIC: Principal | ICD-10-CM

## 2023-10-13 DIAGNOSIS — S62102A Fracture of unspecified carpal bone, left wrist, initial encounter for closed fracture: Secondary | ICD-10-CM | POA: Diagnosis present

## 2023-10-13 DIAGNOSIS — S90212A Contusion of left great toe with damage to nail, initial encounter: Secondary | ICD-10-CM | POA: Diagnosis present

## 2023-10-13 DIAGNOSIS — Z803 Family history of malignant neoplasm of breast: Secondary | ICD-10-CM | POA: Diagnosis not present

## 2023-10-13 DIAGNOSIS — S92515A Nondisplaced fracture of proximal phalanx of left lesser toe(s), initial encounter for closed fracture: Secondary | ICD-10-CM | POA: Diagnosis present

## 2023-10-13 DIAGNOSIS — Z823 Family history of stroke: Secondary | ICD-10-CM

## 2023-10-13 DIAGNOSIS — S92502A Displaced unspecified fracture of left lesser toe(s), initial encounter for closed fracture: Secondary | ICD-10-CM | POA: Insufficient documentation

## 2023-10-13 DIAGNOSIS — Z821 Family history of blindness and visual loss: Secondary | ICD-10-CM

## 2023-10-13 DIAGNOSIS — S5292XB Unspecified fracture of left forearm, initial encounter for open fracture type I or II: Secondary | ICD-10-CM | POA: Diagnosis present

## 2023-10-13 DIAGNOSIS — Y92410 Unspecified street and highway as the place of occurrence of the external cause: Secondary | ICD-10-CM

## 2023-10-13 DIAGNOSIS — M25332 Other instability, left wrist: Secondary | ICD-10-CM | POA: Diagnosis present

## 2023-10-13 DIAGNOSIS — Z820 Family history of epilepsy and other diseases of the nervous system: Secondary | ICD-10-CM

## 2023-10-13 DIAGNOSIS — S5292XA Unspecified fracture of left forearm, initial encounter for closed fracture: Secondary | ICD-10-CM

## 2023-10-13 DIAGNOSIS — Z806 Family history of leukemia: Secondary | ICD-10-CM

## 2023-10-13 HISTORY — DX: Headache, unspecified: R51.9

## 2023-10-13 HISTORY — PX: ORIF ULNAR FRACTURE: SHX5417

## 2023-10-13 LAB — COMPREHENSIVE METABOLIC PANEL
ALT: 16 U/L (ref 0–44)
AST: 26 U/L (ref 15–41)
Albumin: 3.3 g/dL — ABNORMAL LOW (ref 3.5–5.0)
Alkaline Phosphatase: 41 U/L (ref 38–126)
Anion gap: 9 (ref 5–15)
BUN: 13 mg/dL (ref 8–23)
CO2: 23 mmol/L (ref 22–32)
Calcium: 8.5 mg/dL — ABNORMAL LOW (ref 8.9–10.3)
Chloride: 106 mmol/L (ref 98–111)
Creatinine, Ser: 1.09 mg/dL — ABNORMAL HIGH (ref 0.44–1.00)
GFR, Estimated: 58 mL/min — ABNORMAL LOW (ref >60)
Glucose, Bld: 156 mg/dL — ABNORMAL HIGH (ref 70–99)
Potassium: 3 mmol/L — ABNORMAL LOW (ref 3.5–5.1)
Sodium: 138 mmol/L (ref 135–145)
Total Bilirubin: 0.7 mg/dL (ref 0.0–1.2)
Total Protein: 6.4 g/dL — ABNORMAL LOW (ref 6.5–8.1)

## 2023-10-13 LAB — SURGICAL PCR SCREEN
MRSA, PCR: NEGATIVE
Staphylococcus aureus: POSITIVE — AB

## 2023-10-13 LAB — CBC
HCT: 37.6 % (ref 36.0–46.0)
Hemoglobin: 12.4 g/dL (ref 12.0–15.0)
MCH: 28.8 pg (ref 26.0–34.0)
MCHC: 33 g/dL (ref 30.0–36.0)
MCV: 87.2 fL (ref 80.0–100.0)
Platelets: 278 10*3/uL (ref 150–400)
RBC: 4.31 MIL/uL (ref 3.87–5.11)
RDW: 13.8 % (ref 11.5–15.5)
WBC: 12 10*3/uL — ABNORMAL HIGH (ref 4.0–10.5)
nRBC: 0 % (ref 0.0–0.2)

## 2023-10-13 LAB — VITAMIN D 25 HYDROXY (VIT D DEFICIENCY, FRACTURES): Vit D, 25-Hydroxy: 13.76 ng/mL — ABNORMAL LOW (ref 30–100)

## 2023-10-13 SURGERY — OPEN REDUCTION INTERNAL FIXATION (ORIF) ULNAR FRACTURE
Anesthesia: Monitor Anesthesia Care | Laterality: Left

## 2023-10-13 MED ORDER — OXYCODONE HCL 5 MG/5ML PO SOLN: 5.0000 mg | Freq: Once | ORAL | Status: DC | PRN

## 2023-10-13 MED ORDER — METOCLOPRAMIDE HCL 5 MG/ML IJ SOLN: 5.0000 mg | Freq: Three times a day (TID) | INTRAMUSCULAR | Status: DC | PRN

## 2023-10-13 MED ORDER — DEXAMETHASONE SODIUM PHOSPHATE 10 MG/ML IJ SOLN
INTRAMUSCULAR | Status: DC | PRN
  Administered 2023-10-13: 10 mg via INTRAVENOUS

## 2023-10-13 MED ORDER — ORAL CARE MOUTH RINSE: 15.0000 mL | Freq: Once | OROMUCOSAL | Status: AC

## 2023-10-13 MED ORDER — MORPHINE SULFATE (PF) 2 MG/ML IV SOLN
2.0000 mg | Freq: Once | INTRAVENOUS | Status: AC
  Administered 2023-10-13: 2 mg via INTRAVENOUS
  Filled 2023-10-13: qty 1

## 2023-10-13 MED ORDER — ONDANSETRON HCL 4 MG/2ML IJ SOLN
INTRAMUSCULAR | Status: DC | PRN
  Administered 2023-10-13: 4 mg via INTRAVENOUS

## 2023-10-13 MED ORDER — POVIDONE-IODINE 10 % EX SWAB
2.0000 | Freq: Once | CUTANEOUS | Status: AC
  Administered 2023-10-13: 2 via TOPICAL

## 2023-10-13 MED ORDER — FENTANYL CITRATE (PF) 100 MCG/2ML IJ SOLN: 50.0000 ug | Freq: Once | INTRAMUSCULAR | Status: AC

## 2023-10-13 MED ORDER — CHLORHEXIDINE GLUCONATE 4 % EX SOLN: 60.0000 mL | Freq: Once | CUTANEOUS | Status: DC

## 2023-10-13 MED ORDER — OXYCODONE HCL 5 MG PO TABS
5.0000 mg | ORAL_TABLET | ORAL | Status: DC | PRN
  Administered 2023-10-13 – 2023-10-14 (×3): 5 mg via ORAL
  Filled 2023-10-13 (×3): qty 1

## 2023-10-13 MED ORDER — PHENYLEPHRINE 80 MCG/ML (10ML) SYRINGE FOR IV PUSH (FOR BLOOD PRESSURE SUPPORT)
PREFILLED_SYRINGE | INTRAVENOUS | Status: AC
  Filled 2023-10-13: qty 10

## 2023-10-13 MED ORDER — VANCOMYCIN HCL 1000 MG IV SOLR
INTRAVENOUS | Status: DC | PRN
  Administered 2023-10-13: 1000 mg via TOPICAL

## 2023-10-13 MED ORDER — EPHEDRINE SULFATE-NACL 50-0.9 MG/10ML-% IV SOSY
PREFILLED_SYRINGE | INTRAVENOUS | Status: DC | PRN
  Administered 2023-10-13 (×2): 5 mg via INTRAVENOUS

## 2023-10-13 MED ORDER — DIPHENHYDRAMINE HCL 12.5 MG/5ML PO ELIX: 12.5000 mg | ORAL_SOLUTION | ORAL | Status: DC | PRN

## 2023-10-13 MED ORDER — FENTANYL CITRATE (PF) 100 MCG/2ML IJ SOLN
INTRAMUSCULAR | Status: AC
  Administered 2023-10-13: 50 ug via INTRAVENOUS
  Filled 2023-10-13: qty 2

## 2023-10-13 MED ORDER — ROCURONIUM BROMIDE 10 MG/ML (PF) SYRINGE
PREFILLED_SYRINGE | INTRAVENOUS | Status: AC
  Filled 2023-10-13: qty 10

## 2023-10-13 MED ORDER — FENTANYL CITRATE (PF) 250 MCG/5ML IJ SOLN
INTRAMUSCULAR | Status: AC
  Filled 2023-10-13: qty 5

## 2023-10-13 MED ORDER — FENTANYL CITRATE (PF) 250 MCG/5ML IJ SOLN
INTRAMUSCULAR | Status: DC | PRN
  Administered 2023-10-13: 50 ug via INTRAVENOUS

## 2023-10-13 MED ORDER — TAMOXIFEN CITRATE 10 MG PO TABS
20.0000 mg | ORAL_TABLET | Freq: Every day | ORAL | Status: DC
  Administered 2023-10-14: 20 mg via ORAL
  Filled 2023-10-13: qty 2

## 2023-10-13 MED ORDER — DEXAMETHASONE SODIUM PHOSPHATE 10 MG/ML IJ SOLN
INTRAMUSCULAR | Status: AC
  Filled 2023-10-13: qty 1

## 2023-10-13 MED ORDER — VANCOMYCIN HCL 1000 MG IV SOLR
INTRAVENOUS | Status: AC
  Filled 2023-10-13: qty 20

## 2023-10-13 MED ORDER — CHLORHEXIDINE GLUCONATE 4 % EX SOLN: 1.0000 | CUTANEOUS | 1 refills | Status: DC

## 2023-10-13 MED ORDER — MIDAZOLAM HCL 2 MG/2ML IJ SOLN
INTRAMUSCULAR | Status: AC
  Administered 2023-10-13: 2 mg via INTRAVENOUS
  Filled 2023-10-13: qty 2

## 2023-10-13 MED ORDER — EPHEDRINE 5 MG/ML INJ
INTRAVENOUS | Status: AC
  Filled 2023-10-13: qty 5

## 2023-10-13 MED ORDER — ASPIRIN 325 MG PO TABS: 325.0000 mg | ORAL_TABLET | Freq: Every day | ORAL | Status: DC

## 2023-10-13 MED ORDER — SODIUM CHLORIDE 0.9% FLUSH
3.0000 mL | Freq: Two times a day (BID) | INTRAVENOUS | Status: DC
  Administered 2023-10-13 – 2023-10-14 (×2): 3 mL via INTRAVENOUS

## 2023-10-13 MED ORDER — IOHEXOL 350 MG/ML SOLN
75.0000 mL | Freq: Once | INTRAVENOUS | Status: AC | PRN
  Administered 2023-10-13: 75 mL via INTRAVENOUS

## 2023-10-13 MED ORDER — HYDRALAZINE HCL 10 MG PO TABS: 10.0000 mg | ORAL_TABLET | Freq: Four times a day (QID) | ORAL | Status: DC | PRN

## 2023-10-13 MED ORDER — BUPIVACAINE-EPINEPHRINE (PF) 0.5% -1:200000 IJ SOLN
INTRAMUSCULAR | Status: DC | PRN
  Administered 2023-10-13: 30 mL via PERINEURAL

## 2023-10-13 MED ORDER — OXYCODONE HCL 5 MG PO TABS: 5.0000 mg | ORAL_TABLET | Freq: Once | ORAL | Status: DC | PRN

## 2023-10-13 MED ORDER — POLYETHYLENE GLYCOL 3350 17 G PO PACK: 17.0000 g | PACK | Freq: Every day | ORAL | Status: DC | PRN

## 2023-10-13 MED ORDER — SUCCINYLCHOLINE CHLORIDE 200 MG/10ML IV SOSY
PREFILLED_SYRINGE | INTRAVENOUS | Status: AC
  Filled 2023-10-13: qty 10

## 2023-10-13 MED ORDER — PROPOFOL 10 MG/ML IV BOLUS
INTRAVENOUS | Status: AC
  Filled 2023-10-13: qty 20

## 2023-10-13 MED ORDER — SODIUM CHLORIDE 0.9 % IR SOLN
Status: DC | PRN
  Administered 2023-10-13: 3000 mL

## 2023-10-13 MED ORDER — CLONIDINE HCL (ANALGESIA) 100 MCG/ML EP SOLN
EPIDURAL | Status: DC | PRN
Start: 2023-10-13 — End: 2023-10-13
  Administered 2023-10-13: 100 ug

## 2023-10-13 MED ORDER — CEFAZOLIN SODIUM-DEXTROSE 2-4 GM/100ML-% IV SOLN
2.0000 g | INTRAVENOUS | Status: AC
  Administered 2023-10-13: 2 g via INTRAVENOUS

## 2023-10-13 MED ORDER — CEFAZOLIN SODIUM-DEXTROSE 2-4 GM/100ML-% IV SOLN
INTRAVENOUS | Status: AC
  Filled 2023-10-13: qty 100

## 2023-10-13 MED ORDER — DOCUSATE SODIUM 100 MG PO CAPS
100.0000 mg | ORAL_CAPSULE | Freq: Two times a day (BID) | ORAL | Status: DC
  Administered 2023-10-13 – 2023-10-14 (×2): 100 mg via ORAL
  Filled 2023-10-13 (×2): qty 1

## 2023-10-13 MED ORDER — MUPIROCIN 2 % EX OINT: 1.0000 | TOPICAL_OINTMENT | Freq: Two times a day (BID) | CUTANEOUS | 0 refills | Status: AC

## 2023-10-13 MED ORDER — LACTATED RINGERS IV SOLN: INTRAVENOUS | Status: DC

## 2023-10-13 MED ORDER — ONDANSETRON HCL 4 MG/2ML IJ SOLN
INTRAMUSCULAR | Status: AC
  Filled 2023-10-13: qty 2

## 2023-10-13 MED ORDER — MIDAZOLAM HCL 2 MG/2ML IJ SOLN: 2.0000 mg | Freq: Once | INTRAMUSCULAR | Status: AC

## 2023-10-13 MED ORDER — LIDOCAINE 2% (20 MG/ML) 5 ML SYRINGE
INTRAMUSCULAR | Status: AC
  Filled 2023-10-13: qty 5

## 2023-10-13 MED ORDER — ACETAMINOPHEN 500 MG PO TABS
1000.0000 mg | ORAL_TABLET | Freq: Four times a day (QID) | ORAL | Status: DC
  Administered 2023-10-13 – 2023-10-14 (×2): 1000 mg via ORAL
  Filled 2023-10-13 (×2): qty 2

## 2023-10-13 MED ORDER — HYDROMORPHONE HCL 1 MG/ML IJ SOLN
0.5000 mg | INTRAMUSCULAR | Status: DC | PRN
Start: 2023-10-13 — End: 2023-10-14

## 2023-10-13 MED ORDER — CEFAZOLIN SODIUM-DEXTROSE 2-4 GM/100ML-% IV SOLN
2.0000 g | Freq: Three times a day (TID) | INTRAVENOUS | Status: DC
  Administered 2023-10-13 – 2023-10-14 (×2): 2 g via INTRAVENOUS
  Filled 2023-10-13 (×2): qty 100

## 2023-10-13 MED ORDER — FENTANYL CITRATE (PF) 100 MCG/2ML IJ SOLN: 25.0000 ug | INTRAMUSCULAR | Status: DC | PRN

## 2023-10-13 MED ORDER — ONDANSETRON HCL 4 MG/2ML IJ SOLN: 4.0000 mg | Freq: Four times a day (QID) | INTRAMUSCULAR | Status: DC | PRN

## 2023-10-13 MED ORDER — MIDAZOLAM HCL 2 MG/2ML IJ SOLN
INTRAMUSCULAR | Status: AC
  Filled 2023-10-13: qty 2

## 2023-10-13 MED ORDER — SODIUM CHLORIDE 0.9% FLUSH: 3.0000 mL | INTRAVENOUS | Status: DC | PRN

## 2023-10-13 MED ORDER — FENTANYL CITRATE (PF) 100 MCG/2ML IJ SOLN
INTRAMUSCULAR | Status: AC
Start: 2023-10-13 — End: 2023-10-13
  Administered 2023-10-13: 50 ug via INTRAVENOUS
  Filled 2023-10-13: qty 2

## 2023-10-13 MED ORDER — METOCLOPRAMIDE HCL 5 MG PO TABS: 5.0000 mg | ORAL_TABLET | Freq: Three times a day (TID) | ORAL | Status: DC | PRN

## 2023-10-13 MED ORDER — ONDANSETRON HCL 4 MG PO TABS: 4.0000 mg | ORAL_TABLET | Freq: Four times a day (QID) | ORAL | Status: DC | PRN

## 2023-10-13 MED ORDER — PROPOFOL 10 MG/ML IV BOLUS
INTRAVENOUS | Status: DC | PRN
  Administered 2023-10-13: 75 ug/kg/min via INTRAVENOUS
  Administered 2023-10-13: 20 mg via INTRAVENOUS

## 2023-10-13 MED ORDER — FENTANYL CITRATE (PF) 100 MCG/2ML IJ SOLN
50.0000 ug | Freq: Once | INTRAMUSCULAR | Status: AC
  Administered 2023-10-13: 50 ug via INTRAVENOUS

## 2023-10-13 MED ORDER — CHLORHEXIDINE GLUCONATE 0.12 % MT SOLN
15.0000 mL | Freq: Once | OROMUCOSAL | Status: AC
  Administered 2023-10-13: 15 mL via OROMUCOSAL

## 2023-10-13 MED ORDER — PHENYLEPHRINE HCL-NACL 20-0.9 MG/250ML-% IV SOLN
INTRAVENOUS | Status: DC | PRN
  Administered 2023-10-13 (×3): 100 ug via INTRAVENOUS

## 2023-10-13 MED ORDER — METHOCARBAMOL 500 MG PO TABS
500.0000 mg | ORAL_TABLET | Freq: Four times a day (QID) | ORAL | Status: DC | PRN
  Administered 2023-10-14: 500 mg via ORAL
  Filled 2023-10-13: qty 1

## 2023-10-13 MED ORDER — METHOCARBAMOL 1000 MG/10ML IJ SOLN: 500.0000 mg | Freq: Four times a day (QID) | INTRAMUSCULAR | Status: DC | PRN

## 2023-10-13 MED ORDER — HYDROCHLOROTHIAZIDE 25 MG PO TABS
25.0000 mg | ORAL_TABLET | Freq: Every day | ORAL | Status: DC
  Administered 2023-10-14: 25 mg via ORAL
  Filled 2023-10-13: qty 1

## 2023-10-13 MED ORDER — INFLUENZA VAC A&B SURF ANT ADJ 0.5 ML IM SUSY
0.5000 mL | PREFILLED_SYRINGE | INTRAMUSCULAR | Status: AC
  Administered 2023-10-14: 0.5 mL via INTRAMUSCULAR
  Filled 2023-10-13: qty 0.5

## 2023-10-13 SURGICAL SUPPLY — 52 items
BAG COUNTER SPONGE SURGICOUNT (BAG) ×2 IMPLANT
BIT DRILL QC 2.5X135 (BIT) IMPLANT
BIT DRILL QC SFS 2.5X170 (BIT) IMPLANT
BLADE CLIPPER SURG (BLADE) IMPLANT
BNDG ELASTIC 2 VLCR STRL LF (GAUZE/BANDAGES/DRESSINGS) IMPLANT
BNDG ELASTIC 3INX 5YD STR LF (GAUZE/BANDAGES/DRESSINGS) ×2 IMPLANT
BNDG ELASTIC 3X5.8 VLCR NS LF (GAUZE/BANDAGES/DRESSINGS) IMPLANT
BNDG ELASTIC 4X5.8 VLCR STR LF (GAUZE/BANDAGES/DRESSINGS) ×2 IMPLANT
BNDG ESMARK 4X9 LF (GAUZE/BANDAGES/DRESSINGS) ×2 IMPLANT
BNDG GAUZE DERMACEA FLUFF 4 (GAUZE/BANDAGES/DRESSINGS) ×2 IMPLANT
COVER SURGICAL LIGHT HANDLE (MISCELLANEOUS) ×2 IMPLANT
CUFF TOURN SGL QUICK 18X4 (TOURNIQUET CUFF) IMPLANT
CUFF TRNQT CYL 24X4X16.5-23 (TOURNIQUET CUFF) IMPLANT
DRAPE C-ARM 35X43 STRL (DRAPES) IMPLANT
DRAPE U-SHAPE 47X51 STRL (DRAPES) ×2 IMPLANT
DRSG MEPITEL 8X12 (GAUZE/BANDAGES/DRESSINGS) IMPLANT
GAUZE SPONGE 4X4 12PLY STRL (GAUZE/BANDAGES/DRESSINGS) ×2 IMPLANT
GAUZE XEROFORM 1X8 LF (GAUZE/BANDAGES/DRESSINGS) ×2 IMPLANT
GLOVE BIO SURGEON STRL SZ 6.5 (GLOVE) ×2 IMPLANT
GLOVE BIO SURGEON STRL SZ7.5 (GLOVE) ×2 IMPLANT
GLOVE BIOGEL PI IND STRL 7.0 (GLOVE) ×2 IMPLANT
GLOVE BIOGEL PI IND STRL 7.5 (GLOVE) ×2 IMPLANT
GOWN STRL REUS W/ TWL LRG LVL3 (GOWN DISPOSABLE) ×2 IMPLANT
K-WIRE 1.6X150 (WIRE) ×1 IMPLANT
KIT BASIN OR (CUSTOM PROCEDURE TRAY) ×2 IMPLANT
KIT TURNOVER KIT B (KITS) ×2 IMPLANT
KWIRE 1.6X150 (WIRE) IMPLANT
MANIFOLD NEPTUNE II (INSTRUMENTS) ×2 IMPLANT
NDL 22X1.5 STRL (OR ONLY) (MISCELLANEOUS) IMPLANT
NEEDLE 22X1.5 STRL (OR ONLY) (MISCELLANEOUS) IMPLANT
NS IRRIG 1000ML POUR BTL (IV SOLUTION) ×2 IMPLANT
PACK ORTHO EXTREMITY (CUSTOM PROCEDURE TRAY) ×2 IMPLANT
PAD ARMBOARD 7.5X6 YLW CONV (MISCELLANEOUS) ×4 IMPLANT
PAD CAST 4YDX4 CTTN HI CHSV (CAST SUPPLIES) ×2 IMPLANT
PADDING CAST COTTON 2X4 NS (CAST SUPPLIES) IMPLANT
PADDING CAST SYNTHETIC 3X4 NS (CAST SUPPLIES) IMPLANT
PLATE LCP 3.5 7H 98 (Plate) IMPLANT
SCREW LOCK CORT ST 3.5X14 (Screw) IMPLANT
SCREW LOCK CORT ST 3.5X16 (Screw) IMPLANT
SLING ARM FOAM STRAP LRG (SOFTGOODS) IMPLANT
SPIKE FLUID TRANSFER (MISCELLANEOUS) ×2 IMPLANT
SUT FIBERWIRE #2 38 REV NDL BL (SUTURE) ×1 IMPLANT
SUT MNCRL AB 3-0 PS2 27 (SUTURE) IMPLANT
SUT MON AB 2-0 CT2 27 (SUTURE) IMPLANT
SUT VIC AB 0 CT1 27XBRD ANBCTR (SUTURE) IMPLANT
SUTURE FIBERWR#2 38 REV NDL BL (SUTURE) IMPLANT
SYR CONTROL 10ML LL (SYRINGE) ×2 IMPLANT
TOWEL GREEN STERILE (TOWEL DISPOSABLE) ×2 IMPLANT
TOWEL GREEN STERILE FF (TOWEL DISPOSABLE) ×2 IMPLANT
TUBE CONNECTING 12X1/4 (SUCTIONS) ×2 IMPLANT
WATER STERILE IRR 1000ML POUR (IV SOLUTION) ×2 IMPLANT
YANKAUER SUCT BULB TIP NO VENT (SUCTIONS) IMPLANT

## 2023-10-13 NOTE — Progress Notes (Signed)
 Transition of Care [facility]) - CAGE-AID Screening   Patient Details  Name: LARAYAH CLUTE MRN: [id number] Date of Birth: 1960-11-19  Transition of [facility]) CM/SW Contact:    Leota Sauers, RN Phone Number: 10/13/2023, 10:18 PM   Clinical Narrative:  Patient denies the use of alcohol and illicit substances. Resources not given at this time.  CAGE-AID Screening:    Have You Ever Felt You Ought to Cut Down on Your Drinking or Drug Use?: No Have People Annoyed You By Critizing Your Drinking Or Drug Use?: No Have You Felt Bad Or Guilty About Your Drinking Or Drug Use?: No Have You Ever Had a Drink or Used Drugs First Thing In The Morning to Steady Your Nerves or to Get Rid of a Hangover?: No CAGE-AID Score: 0  Substance Abuse Education Offered: No
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
## 2023-10-13 NOTE — Anesthesia Procedure Notes (Signed)
 Anesthesia Regional Block: Supraclavicular block   Pre-Anesthetic Checklist: , timeout performed,  Correct Patient, Correct Site, Correct Laterality,  Correct Procedure, Correct Position, site marked,  Risks and benefits discussed,  Pre-op evaluation,  At surgeon's request and post-op pain management  Laterality: Left  Prep: Maximum Sterile Barrier Precautions used, chloraprep       Needles:  Injection technique: Single-shot  Needle Type: Echogenic Stimulator Needle     Needle Length: 9cm  Needle Gauge: 22     Additional Needles:   Procedures:,,,, ultrasound used (permanent image in chart),,    Narrative:  Start time: 10/13/2023 3:07 PM End time: 10/13/2023 3:10 PM Injection made incrementally with aspirations every 5 mL.  Performed by: Personally  Anesthesiologist: Kaylyn Layer, MD  Additional Notes: Risks, benefits, and alternative discussed. Patient gave consent for procedure. Patient prepped and draped in sterile fashion. Sedation administered, patient remains easily responsive to voice. Relevant anatomy identified with ultrasound guidance. Local anesthetic given in 5cc increments with no signs or symptoms of intravascular injection. No pain or paraesthesias with injection. Patient monitored throughout procedure with signs of LAST or immediate complications. Tolerated well. Ultrasound image placed in chart.  Amalia Greenhouse, MD

## 2023-10-13 NOTE — Transfer of Care (Signed)
 Immediate Anesthesia Transfer of Care Note  Patient: Autumn Patel  Procedure(s) Performed: OPEN REDUCTION INTERNAL FIXATION (ORIF) ULNAR FRACTURE (Left)  Patient Location: PACU  Anesthesia Type:MAC and Regional  Level of Consciousness: awake, alert , oriented, and patient cooperative  Airway & Oxygen Therapy: Patient Spontanous Breathing  Post-op Assessment: Report given to RN, Post -op Vital signs reviewed and stable, Patient moving all extremities, Patient moving all extremities X 4, and Patient able to stick tongue midline  Post vital signs: Reviewed and stable  Last Vitals:  Vitals Value Taken Time  BP 116/74 10/13/23 1703  Temp    Pulse 89 10/13/23 1705  Resp 15 10/13/23 1705  SpO2 96 % 10/13/23 1705  Vitals shown include unfiled device data.  Last Pain:  Vitals:   10/13/23 1520  TempSrc:   PainSc: 0-No pain      Patients Stated Pain Goal: 3 (10/13/23 1246)  Complications: No notable events documented.

## 2023-10-13 NOTE — Interval H&P Note (Signed)
 History and Physical Interval Note:  10/13/2023 2:50 PM  Autumn Patel  has presented today for surgery, with the diagnosis of LEFT FOREARM FRACTURE.  The various methods of treatment have been discussed with the patient and family. After consideration of risks, benefits and other options for treatment, the patient has consented to  Procedure(s) with comments: OPEN REDUCTION INTERNAL FIXATION (ORIF) ULNAR FRACTURE (Left) - WITH IRRIGATION AND DEBRIDMENT LEFT FOREARM as a surgical intervention.  The patient's history has been reviewed, patient examined, no change in status, stable for surgery.  I have reviewed the patient's chart and labs.  Questions were answered to the patient's satisfaction.     Caryn Bee P Mauri Tolen
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
## 2023-10-13 NOTE — ED Notes (Signed)
Pt to short stay  

## 2023-10-13 NOTE — Anesthesia Preprocedure Evaluation (Addendum)
 Anesthesia Evaluation  Patient identified by MRN, date of birth, ID band Patient awake    Reviewed: Allergy & Precautions, NPO status , Patient's Chart, lab work & pertinent test results  History of Anesthesia Complications Negative for: history of anesthetic complications  Airway Mallampati: II  TM Distance: >3 FB Neck ROM: Full    Dental no notable dental hx.    Pulmonary neg pulmonary ROS   Pulmonary exam normal        Cardiovascular hypertension, Normal cardiovascular exam     Neuro/Psych  Headaches    GI/Hepatic negative GI ROS, Neg liver ROS,,,  Endo/Other  negative endocrine ROS    Renal/GU negative Renal ROS  negative genitourinary   Musculoskeletal LEFT FOREARM FRACTURE   Abdominal   Peds  Hematology negative hematology ROS (+)   Anesthesia Other Findings Day of surgery medications reviewed with patient.  Reproductive/Obstetrics negative OB ROS                              Anesthesia Physical Anesthesia Plan  ASA: 2  Anesthesia Plan: Regional and MAC   Post-op Pain Management:    Induction:   PONV Risk Score and Plan: 2 and Treatment may vary due to age or medical condition, Ondansetron, Propofol infusion and Midazolam  Airway Management Planned: Natural Airway and Simple Face Mask  Additional Equipment:   Intra-op Plan:   Post-operative Plan:   Informed Consent: I have reviewed the patients History and Physical, chart, labs and discussed the procedure including the risks, benefits and alternatives for the proposed anesthesia with the patient or authorized representative who has indicated his/her understanding and acceptance.       Plan Discussed with: CRNA  Anesthesia Plan Comments:          Anesthesia Quick Evaluation
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
## 2023-10-14 ENCOUNTER — Other Ambulatory Visit (HOSPITAL_COMMUNITY): Payer: Self-pay

## 2023-10-14 ENCOUNTER — Encounter (HOSPITAL_COMMUNITY): Payer: Self-pay | Admitting: Student

## 2023-10-14 DIAGNOSIS — S92502A Displaced unspecified fracture of left lesser toe(s), initial encounter for closed fracture: Secondary | ICD-10-CM | POA: Insufficient documentation

## 2023-10-14 LAB — CBC
HCT: 34 % — ABNORMAL LOW (ref 36.0–46.0)
HCT: 33.8 % — ABNORMAL LOW (ref 36.0–46.0)
Hemoglobin: 11.5 g/dL — ABNORMAL LOW (ref 12.0–15.0)
Hemoglobin: 11.2 g/dL — ABNORMAL LOW (ref 12.0–15.0)
MCH: 29 pg (ref 26.0–34.0)
MCH: 28.6 pg (ref 26.0–34.0)
MCHC: 33.8 g/dL (ref 30.0–36.0)
MCHC: 33.1 g/dL (ref 30.0–36.0)
MCV: 85.6 fL (ref 80.0–100.0)
MCV: 86.2 fL (ref 80.0–100.0)
Platelets: 258 10*3/uL (ref 150–400)
Platelets: 245 10*3/uL (ref 150–400)
RBC: 3.97 MIL/uL (ref 3.87–5.11)
RBC: 3.92 MIL/uL (ref 3.87–5.11)
RDW: 13.9 % (ref 11.5–15.5)
RDW: 14.1 % (ref 11.5–15.5)
WBC: 10.7 10*3/uL — ABNORMAL HIGH (ref 4.0–10.5)
WBC: 11 10*3/uL — ABNORMAL HIGH (ref 4.0–10.5)
nRBC: 0 % (ref 0.0–0.2)
nRBC: 0 % (ref 0.0–0.2)

## 2023-10-14 LAB — BASIC METABOLIC PANEL
Anion gap: 9 (ref 5–15)
BUN: 12 mg/dL (ref 8–23)
CO2: 24 mmol/L (ref 22–32)
Calcium: 8.4 mg/dL — ABNORMAL LOW (ref 8.9–10.3)
Chloride: 105 mmol/L (ref 98–111)
Creatinine, Ser: 0.73 mg/dL (ref 0.44–1.00)
GFR, Estimated: >60 mL/min (ref >60)
Glucose, Bld: 121 mg/dL — ABNORMAL HIGH (ref 70–99)
Potassium: 3.3 mmol/L — ABNORMAL LOW (ref 3.5–5.1)
Sodium: 138 mmol/L (ref 135–145)

## 2023-10-14 LAB — CREATININE, SERUM
Creatinine, Ser: 0.83 mg/dL (ref 0.44–1.00)
GFR, Estimated: >60 mL/min (ref >60)

## 2023-10-14 LAB — MAGNESIUM: Magnesium: 2 mg/dL (ref 1.7–2.4)

## 2023-10-14 MED ORDER — VITAMIN D (ERGOCALCIFEROL) 1.25 MG (50000 UNIT) PO CAPS
50000.0000 [IU] | ORAL_CAPSULE | ORAL | Status: DC
Start: 2023-10-14 — End: 2023-10-14
  Administered 2023-10-14: 50000 [IU] via ORAL
  Filled 2023-10-14: qty 1

## 2023-10-14 MED ORDER — ENOXAPARIN SODIUM 40 MG/0.4ML IJ SOSY
40.0000 mg | PREFILLED_SYRINGE | Freq: Every day | INTRAMUSCULAR | Status: DC
  Administered 2023-10-14: 40 mg via SUBCUTANEOUS
  Filled 2023-10-14: qty 0.4

## 2023-10-14 MED ORDER — VITAMIN D (ERGOCALCIFEROL) 1.25 MG (50000 UNIT) PO CAPS
50000.0000 [IU] | ORAL_CAPSULE | ORAL | 0 refills | Status: DC
  Filled 2023-10-14: qty 5, 35d supply, fill #0

## 2023-10-14 MED ORDER — PROPOFOL 10 MG/ML IV BOLUS
INTRAVENOUS | Status: AC
  Filled 2023-10-14: qty 20

## 2023-10-14 MED ORDER — OXYCODONE-ACETAMINOPHEN 5-325 MG PO TABS
1.0000 | ORAL_TABLET | ORAL | 0 refills | Status: DC | PRN
  Filled 2023-10-14: qty 42, 7d supply, fill #0

## 2023-10-14 MED ORDER — POTASSIUM CHLORIDE 20 MEQ PO PACK
40.0000 meq | PACK | Freq: Two times a day (BID) | ORAL | Status: DC
  Administered 2023-10-14: 40 meq via ORAL
  Filled 2023-10-14: qty 2

## 2023-10-14 MED ORDER — ASPIRIN 81 MG PO TBEC
81.0000 mg | DELAYED_RELEASE_TABLET | Freq: Every day | ORAL | 0 refills | Status: AC
  Filled 2023-10-14: qty 30, 30d supply, fill #0

## 2023-10-14 MED ORDER — METHOCARBAMOL 500 MG PO TABS
500.0000 mg | ORAL_TABLET | Freq: Four times a day (QID) | ORAL | 0 refills | Status: DC | PRN
  Filled 2023-10-14: qty 28, 7d supply, fill #0
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
## 2023-10-14 NOTE — Progress Notes (Signed)
 Occupational Therapy Note  Splint modified to improve comfort @ posterior aspect of elbow. Split fitting well. Reviewed care of splint and dressing changes with pt/daughter. Written information provided.  Completed education regarding compensatory techniques for ADL tasks. Pt to follow up with irtho MD to progress rehab of LUE,  Luisa Dago, OT/L   Acute OT Clinical Specialist Acute Rehabilitation Services Pager [phone] Office [phone]

## 2023-10-14 NOTE — Plan of Care (Signed)

## 2023-10-14 NOTE — Progress Notes (Addendum)
-  s/p -Open reduction internal fixation of left Galeazzi fx, 3/10   10/14/23 1002  TOC Brief Assessment  Insurance and Status Reviewed  Home environment has been reviewed From home with hudsband/ family.  Prior level of function: PTA independent with ADL's, no DME USAGE.  Prior/Current Home Services No current home services  Social Drivers of Health Review SDOH reviewed no interventions necessary  Readmission risk has been reviewed No  Transition of care needs transition of care needs identified, TOC will continue to follow   PT/OT evaluations pending... Pt without RX med concerns or transportation issues. TOC team following and will assist with needs.  Gae Gallop RN, [place] [phone]
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
## 2023-10-14 NOTE — Anesthesia Postprocedure Evaluation (Signed)
 Anesthesia Post Note  Patient: Autumn Patel  Procedure(s) Performed: OPEN REDUCTION INTERNAL FIXATION (ORIF) ULNAR FRACTURE (Left)     Patient location during evaluation: PACU Anesthesia Type: Regional Level of consciousness: awake Pain management: pain level controlled Vital Signs Assessment: post-procedure vital signs reviewed and stable Respiratory status: spontaneous breathing, nonlabored ventilation and respiratory function stable Cardiovascular status: blood pressure returned to baseline and stable Postop Assessment: no apparent nausea or vomiting Anesthetic complications: no   No notable events documented.  Last Vitals:  Vitals:   10/14/23 0440 10/14/23 0808  BP: 107/69 121/71  Pulse: 77 89  Resp: 18 17  Temp: 37.1 C 36.9 C  SpO2: 98% 98%    Last Pain:  Vitals:   10/14/23 1236  TempSrc:   PainSc: 0-No pain   Pain Goal: Patients Stated Pain Goal: 3 (10/13/23 1246)                 Catheryn Bacon Tanith Dagostino

## 2023-10-14 NOTE — Progress Notes (Signed)
 Orthopedic Tech Progress Note Patient Details:  Autumn Patel 1961-07-18 [id number]  Ortho Devices Type of Ortho Device: Postop shoe/boot Ortho Device/Splint Location: LLE Ortho Device/Splint Interventions: Ordered, Other (comment)   Post Interventions Patient Tolerated: Well Instructions Provided: Care of device  [name] 10/14/2023, 9:49 AM
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
## 2023-10-14 NOTE — Plan of Care (Signed)

## 2023-10-15 ENCOUNTER — Telehealth: Payer: Self-pay

## 2023-10-15 ENCOUNTER — Ambulatory Visit: Payer: Self-pay | Admitting: Family Medicine
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
## 2023-10-17 ENCOUNTER — Ambulatory Visit: Admitting: Family Medicine

## 2023-10-17 ENCOUNTER — Encounter: Payer: Self-pay | Admitting: Family Medicine

## 2023-10-17 VITALS — BP 152/86 | HR 115 | Temp 97.9°F | Ht 67.0 in | Wt 179.0 lb

## 2023-10-17 DIAGNOSIS — S3092XA Unspecified superficial injury of abdominal wall, initial encounter: Secondary | ICD-10-CM | POA: Diagnosis not present

## 2023-10-17 DIAGNOSIS — S62102S Fracture of unspecified carpal bone, left wrist, sequela: Secondary | ICD-10-CM

## 2023-10-17 DIAGNOSIS — T148XXA Other injury of unspecified body region, initial encounter: Secondary | ICD-10-CM

## 2023-10-17 LAB — COMPLETE METABOLIC PANEL WITH GFR
AG Ratio: 1.4 (calc) (ref 1.0–2.5)
ALT: 17 U/L (ref 6–29)
AST: 31 U/L (ref 10–35)
Albumin: 3.8 g/dL (ref 3.6–5.1)
Alkaline phosphatase (APISO): 43 U/L (ref 37–153)
BUN: 12 mg/dL (ref 7–25)
CO2: 27 mmol/L (ref 20–32)
Calcium: 8.8 mg/dL (ref 8.6–10.4)
Chloride: 102 mmol/L (ref 98–110)
Creat: 0.62 mg/dL (ref 0.50–1.05)
Globulin: 2.7 g/dL (ref 1.9–3.7)
Glucose, Bld: 133 mg/dL — ABNORMAL HIGH (ref 65–99)
Potassium: 3.9 mmol/L (ref 3.5–5.3)
Sodium: 138 mmol/L (ref 135–146)
Total Bilirubin: 0.9 mg/dL (ref 0.2–1.2)
Total Protein: 6.5 g/dL (ref 6.1–8.1)
eGFR: 101 mL/min/{1.73_m2} (ref >=60)

## 2023-10-17 LAB — CBC WITH DIFFERENTIAL/PLATELET
Absolute Lymphocytes: 1371 {cells}/uL (ref 850–3900)
Absolute Monocytes: 362 {cells}/uL (ref 200–950)
Basophils Absolute: 31 {cells}/uL (ref 0–200)
Basophils Relative: 0.4 %
Eosinophils Absolute: 46 {cells}/uL (ref 15–500)
Eosinophils Relative: 0.6 %
HCT: 37.2 % (ref 35.0–45.0)
Hemoglobin: 12.2 g/dL (ref 11.7–15.5)
MCH: 28.4 pg (ref 27.0–33.0)
MCHC: 32.8 g/dL (ref 32.0–36.0)
MCV: 86.5 fL (ref 80.0–100.0)
MPV: 10.1 fL (ref 7.5–12.5)
Monocytes Relative: 4.7 %
Neutro Abs: 5891 {cells}/uL (ref 1500–7800)
Neutrophils Relative %: 76.5 %
Platelets: 314 10*3/uL (ref 140–400)
RBC: 4.3 10*6/uL (ref 3.80–5.10)
RDW: 13.3 % (ref 11.0–15.0)
Total Lymphocyte: 17.8 %
WBC: 7.7 10*3/uL (ref 3.8–10.8)
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
## 2023-10-20 ENCOUNTER — Telehealth: Payer: Self-pay
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
## 2023-10-21 ENCOUNTER — Encounter: Payer: Self-pay | Admitting: Family Medicine

## 2023-10-28 ENCOUNTER — Ambulatory Visit (HOSPITAL_COMMUNITY): Admitting: Occupational Therapy

## 2023-10-29 ENCOUNTER — Telehealth (HOSPITAL_COMMUNITY): Payer: Self-pay | Admitting: Occupational Therapy

## 2023-10-29 NOTE — Telephone Encounter (Signed)
 Spoke with Efraim Kaufmann from [facility]. Pt has followed up with them and is scheduled for an additional follow up on 4/17. Dr. Jena Gauss ok to proceed with evaluation and treatments.    Ezra Sites, OTR/L  [phone] 10/29/23

## 2023-10-30 ENCOUNTER — Ambulatory Visit (HOSPITAL_COMMUNITY): Attending: Family Medicine | Admitting: Occupational Therapy

## 2023-10-30 ENCOUNTER — Encounter (HOSPITAL_COMMUNITY): Payer: Self-pay | Admitting: Occupational Therapy

## 2023-10-30 ENCOUNTER — Other Ambulatory Visit: Payer: Self-pay

## 2023-10-30 DIAGNOSIS — M25532 Pain in left wrist: Secondary | ICD-10-CM | POA: Insufficient documentation

## 2023-10-30 DIAGNOSIS — R600 Localized edema: Secondary | ICD-10-CM | POA: Diagnosis present

## 2023-10-30 DIAGNOSIS — R29898 Other symptoms and signs involving the musculoskeletal system: Secondary | ICD-10-CM | POA: Diagnosis present

## 2023-10-30 DIAGNOSIS — M25632 Stiffness of left wrist, not elsewhere classified: Secondary | ICD-10-CM | POA: Insufficient documentation
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
## 2023-10-30 NOTE — Patient Instructions (Signed)
 Complete each exercise 10-15X, 2-3X/day  1) Digit composite flexion/adduction (make a fist) Hold your hand up as shown. Open and close your hand into a fist and repeat. If you cannot make a full fist, then make a partial fist.    2) Thumb/finger opposition Touch the tip of the thumb to each fingertip one by one. Extend fingers fully after they are touched.      3) Digit Abduction/Adduction Hold hand palm down flat on table. Spread your fingers apart and back together.     4) Edema Massage on fingers-use lotion to massage fingers and hand from the top down, do not extend to wrist.

## 2023-11-10 LAB — HM PAP SMEAR
HM Pap smear: NEGATIVE
HPV, high-risk: NEGATIVE

## 2023-11-12 ENCOUNTER — Encounter (HOSPITAL_COMMUNITY): Payer: Self-pay | Admitting: Occupational Therapy

## 2023-11-12 ENCOUNTER — Ambulatory Visit (HOSPITAL_COMMUNITY): Attending: Family Medicine | Admitting: Occupational Therapy

## 2023-11-12 DIAGNOSIS — E669 Obesity, unspecified: Secondary | ICD-10-CM | POA: Insufficient documentation

## 2023-11-12 DIAGNOSIS — R29898 Other symptoms and signs involving the musculoskeletal system: Secondary | ICD-10-CM | POA: Diagnosis present

## 2023-11-12 DIAGNOSIS — M25532 Pain in left wrist: Secondary | ICD-10-CM | POA: Diagnosis present

## 2023-11-12 DIAGNOSIS — R600 Localized edema: Secondary | ICD-10-CM | POA: Insufficient documentation

## 2023-11-12 DIAGNOSIS — M25632 Stiffness of left wrist, not elsewhere classified: Secondary | ICD-10-CM | POA: Insufficient documentation
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
## 2023-11-12 NOTE — Patient Instructions (Signed)
 Tendon Gliding Exercises: Complete 10X, hold for 3-5 seconds, 1-2x per day  1) Straight: begin with wrist in extended position and fingers straight    2) Hook: Bend your fingers making them look like a hook while keeping your thumb straight.      3) Fist: Make your hand into a fist.      4) Straight Fist: Bend your fingers straight down into a straight fist.      5) Table Top: Straighten your fingers straight out making them look like a table top.          AROM Exercises:  *Complete exercises 10 times each, 3 times per day*  1) Wrist Flexion  Start with wrist at edge of table, palm facing up. With wrist hanging slightly off table, curl wrist upward, and back down.      2) Wrist Extension  Start with wrist at edge of table, palm facing down. With wrist slightly off the edge of the table, curl wrist up and back down.      3) Radial Deviations  Start with forearm flat against a table, wrist hanging slightly off the edge, and palm facing the wall. Bending at the wrist only, and keeping palm facing the wall, bend wrist so fist is pointing towards the floor, back up to start position, and up towards the ceiling. Return to start.        4) WRIST PRONATION  Turn your forearm towards palm face down.  Keep your elbow bent and by the side of your  Body.

## 2023-11-17 ENCOUNTER — Ambulatory Visit (HOSPITAL_COMMUNITY): Admitting: Occupational Therapy

## 2023-11-17 ENCOUNTER — Ambulatory Visit: Payer: Self-pay

## 2023-11-17 ENCOUNTER — Encounter (HOSPITAL_COMMUNITY): Payer: Self-pay | Admitting: Occupational Therapy

## 2023-11-17 DIAGNOSIS — M25632 Stiffness of left wrist, not elsewhere classified: Secondary | ICD-10-CM

## 2023-11-17 DIAGNOSIS — M25532 Pain in left wrist: Secondary | ICD-10-CM | POA: Diagnosis not present

## 2023-11-17 DIAGNOSIS — R600 Localized edema: Secondary | ICD-10-CM

## 2023-11-17 DIAGNOSIS — R29898 Other symptoms and signs involving the musculoskeletal system: Secondary | ICD-10-CM
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
## 2023-11-18 ENCOUNTER — Ambulatory Visit (HOSPITAL_COMMUNITY)

## 2023-11-18 ENCOUNTER — Encounter: Payer: Self-pay | Admitting: Family Medicine

## 2023-11-18 ENCOUNTER — Ambulatory Visit: Admitting: Family Medicine

## 2023-11-18 VITALS — BP 126/78 | HR 90 | Temp 98.0°F | Ht 67.0 in | Wt 179.2 lb

## 2023-11-18 DIAGNOSIS — J3489 Other specified disorders of nose and nasal sinuses: Secondary | ICD-10-CM | POA: Diagnosis not present
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
## 2023-11-24 ENCOUNTER — Encounter (HOSPITAL_COMMUNITY): Payer: Self-pay | Admitting: Occupational Therapy

## 2023-11-24 ENCOUNTER — Ambulatory Visit (HOSPITAL_COMMUNITY): Admitting: Occupational Therapy

## 2023-11-24 DIAGNOSIS — R600 Localized edema: Secondary | ICD-10-CM

## 2023-11-24 DIAGNOSIS — R29898 Other symptoms and signs involving the musculoskeletal system: Secondary | ICD-10-CM

## 2023-11-24 DIAGNOSIS — M25632 Stiffness of left wrist, not elsewhere classified: Secondary | ICD-10-CM

## 2023-11-24 DIAGNOSIS — M25532 Pain in left wrist: Secondary | ICD-10-CM
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
## 2023-11-24 NOTE — Patient Instructions (Signed)
 Complete Exercises 10-15x each, 2x/day  1) Thumb flexion/extension:          2) PIP Joint Blocking Grasp the affected finger, bracing below the middle knuckle, and actively bend the finger as shown.    3) DIP Joint Blocking Grasp the affected finger, bracing below the last knuckle, and actively bend the finger at the last joint.

## 2023-11-26 ENCOUNTER — Ambulatory Visit (HOSPITAL_COMMUNITY): Admitting: Occupational Therapy

## 2023-11-26 ENCOUNTER — Encounter (HOSPITAL_COMMUNITY): Payer: Self-pay | Admitting: Occupational Therapy

## 2023-11-26 DIAGNOSIS — M25632 Stiffness of left wrist, not elsewhere classified: Secondary | ICD-10-CM

## 2023-11-26 DIAGNOSIS — R600 Localized edema: Secondary | ICD-10-CM

## 2023-11-26 DIAGNOSIS — M25532 Pain in left wrist: Secondary | ICD-10-CM | POA: Diagnosis not present

## 2023-11-26 DIAGNOSIS — R29898 Other symptoms and signs involving the musculoskeletal system: Secondary | ICD-10-CM
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
## 2023-12-01 ENCOUNTER — Ambulatory Visit (HOSPITAL_COMMUNITY): Admitting: Occupational Therapy

## 2023-12-01 ENCOUNTER — Encounter (HOSPITAL_COMMUNITY): Payer: Self-pay | Admitting: Occupational Therapy

## 2023-12-01 DIAGNOSIS — R29898 Other symptoms and signs involving the musculoskeletal system: Secondary | ICD-10-CM

## 2023-12-01 DIAGNOSIS — M25632 Stiffness of left wrist, not elsewhere classified: Secondary | ICD-10-CM

## 2023-12-01 DIAGNOSIS — R600 Localized edema: Secondary | ICD-10-CM

## 2023-12-01 DIAGNOSIS — M25532 Pain in left wrist: Secondary | ICD-10-CM
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
## 2023-12-03 ENCOUNTER — Ambulatory Visit (HOSPITAL_COMMUNITY): Admitting: Occupational Therapy

## 2023-12-03 ENCOUNTER — Encounter (HOSPITAL_COMMUNITY): Payer: Self-pay | Admitting: Occupational Therapy

## 2023-12-03 DIAGNOSIS — R600 Localized edema: Secondary | ICD-10-CM

## 2023-12-03 DIAGNOSIS — M25532 Pain in left wrist: Secondary | ICD-10-CM

## 2023-12-03 DIAGNOSIS — R29898 Other symptoms and signs involving the musculoskeletal system: Secondary | ICD-10-CM

## 2023-12-03 DIAGNOSIS — M25632 Stiffness of left wrist, not elsewhere classified: Secondary | ICD-10-CM
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
## 2023-12-08 ENCOUNTER — Encounter (HOSPITAL_COMMUNITY): Payer: Self-pay | Admitting: Occupational Therapy

## 2023-12-08 ENCOUNTER — Ambulatory Visit (HOSPITAL_COMMUNITY): Attending: Family Medicine | Admitting: Occupational Therapy

## 2023-12-08 DIAGNOSIS — M25632 Stiffness of left wrist, not elsewhere classified: Secondary | ICD-10-CM | POA: Insufficient documentation

## 2023-12-08 DIAGNOSIS — M25532 Pain in left wrist: Secondary | ICD-10-CM | POA: Diagnosis present

## 2023-12-08 DIAGNOSIS — R600 Localized edema: Secondary | ICD-10-CM | POA: Diagnosis present

## 2023-12-08 DIAGNOSIS — R29898 Other symptoms and signs involving the musculoskeletal system: Secondary | ICD-10-CM | POA: Insufficient documentation
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
## 2023-12-10 ENCOUNTER — Ambulatory Visit (HOSPITAL_COMMUNITY): Admitting: Occupational Therapy

## 2023-12-10 ENCOUNTER — Encounter (HOSPITAL_COMMUNITY): Payer: Self-pay | Admitting: Occupational Therapy

## 2023-12-10 DIAGNOSIS — M25632 Stiffness of left wrist, not elsewhere classified: Secondary | ICD-10-CM

## 2023-12-10 DIAGNOSIS — M25532 Pain in left wrist: Secondary | ICD-10-CM | POA: Diagnosis not present

## 2023-12-10 DIAGNOSIS — R29898 Other symptoms and signs involving the musculoskeletal system: Secondary | ICD-10-CM

## 2023-12-10 DIAGNOSIS — R600 Localized edema: Secondary | ICD-10-CM
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
## 2023-12-15 ENCOUNTER — Ambulatory Visit (HOSPITAL_COMMUNITY): Admitting: Occupational Therapy

## 2023-12-15 ENCOUNTER — Encounter (HOSPITAL_COMMUNITY): Payer: Self-pay | Admitting: Occupational Therapy

## 2023-12-15 DIAGNOSIS — R600 Localized edema: Secondary | ICD-10-CM

## 2023-12-15 DIAGNOSIS — M25532 Pain in left wrist: Secondary | ICD-10-CM | POA: Diagnosis not present

## 2023-12-15 DIAGNOSIS — M25632 Stiffness of left wrist, not elsewhere classified: Secondary | ICD-10-CM

## 2023-12-15 DIAGNOSIS — R29898 Other symptoms and signs involving the musculoskeletal system: Secondary | ICD-10-CM
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
## 2023-12-17 ENCOUNTER — Encounter (HOSPITAL_COMMUNITY): Payer: Self-pay | Admitting: Occupational Therapy

## 2023-12-17 ENCOUNTER — Ambulatory Visit (HOSPITAL_COMMUNITY): Admitting: Occupational Therapy

## 2023-12-17 DIAGNOSIS — M25532 Pain in left wrist: Secondary | ICD-10-CM | POA: Diagnosis not present

## 2023-12-17 DIAGNOSIS — M25632 Stiffness of left wrist, not elsewhere classified: Secondary | ICD-10-CM

## 2023-12-17 DIAGNOSIS — R29898 Other symptoms and signs involving the musculoskeletal system: Secondary | ICD-10-CM

## 2023-12-17 DIAGNOSIS — R600 Localized edema: Secondary | ICD-10-CM
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
## 2023-12-22 ENCOUNTER — Encounter (HOSPITAL_COMMUNITY): Admitting: Occupational Therapy

## 2023-12-24 ENCOUNTER — Encounter (HOSPITAL_COMMUNITY): Admitting: Occupational Therapy

## 2023-12-25 ENCOUNTER — Ambulatory Visit (HOSPITAL_COMMUNITY): Admitting: Occupational Therapy

## 2023-12-25 ENCOUNTER — Encounter (HOSPITAL_COMMUNITY): Payer: Self-pay | Admitting: Occupational Therapy

## 2023-12-25 DIAGNOSIS — R29898 Other symptoms and signs involving the musculoskeletal system: Secondary | ICD-10-CM

## 2023-12-25 DIAGNOSIS — M25632 Stiffness of left wrist, not elsewhere classified: Secondary | ICD-10-CM

## 2023-12-25 DIAGNOSIS — M25532 Pain in left wrist: Secondary | ICD-10-CM
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
## 2023-12-30 ENCOUNTER — Ambulatory Visit (HOSPITAL_COMMUNITY): Admitting: Occupational Therapy

## 2023-12-30 ENCOUNTER — Encounter (HOSPITAL_COMMUNITY): Payer: Self-pay | Admitting: Occupational Therapy

## 2023-12-30 DIAGNOSIS — M25532 Pain in left wrist: Secondary | ICD-10-CM | POA: Diagnosis not present

## 2023-12-30 DIAGNOSIS — R29898 Other symptoms and signs involving the musculoskeletal system: Secondary | ICD-10-CM

## 2023-12-30 DIAGNOSIS — M25632 Stiffness of left wrist, not elsewhere classified: Secondary | ICD-10-CM

## 2023-12-30 DIAGNOSIS — R600 Localized edema: Secondary | ICD-10-CM
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
## 2023-12-30 NOTE — Patient Instructions (Signed)
 1) Wrist Pronation Stretch ->   Begin in an upright sitting position. Rest one forearm on a table or surface next to your body with your thumb up. Gently grasp this hand with your other hand. Using your supportive hand, slowly turn your palm downward until you feel a stretch, and hold. Make sure to keep your wrist straight during the exercise. Hold for 10 seconds, complete 3-5 times. 2x/day  2) Forearm pronation/supination with ball  ->   Begin standing upright with your elbows bent to 90 degree angles, holding a ball with both hands. Rotate your forearms so one arm is palm up and the other is palm down. Hold, then repeat in the opposite direction. Make sure to keep your elbows tucked at your sides and only roatate your forearms during the exercise.  Hold for 5 seconds, complete 10 times. 2x/day

## 2024-01-01 ENCOUNTER — Encounter (HOSPITAL_COMMUNITY): Payer: Self-pay | Admitting: Occupational Therapy

## 2024-01-01 ENCOUNTER — Ambulatory Visit (HOSPITAL_COMMUNITY): Admitting: Occupational Therapy

## 2024-01-01 DIAGNOSIS — M25532 Pain in left wrist: Secondary | ICD-10-CM | POA: Diagnosis not present

## 2024-01-01 DIAGNOSIS — M25632 Stiffness of left wrist, not elsewhere classified: Secondary | ICD-10-CM

## 2024-01-01 DIAGNOSIS — R600 Localized edema: Secondary | ICD-10-CM

## 2024-01-01 DIAGNOSIS — R29898 Other symptoms and signs involving the musculoskeletal system: Secondary | ICD-10-CM
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
## 2024-01-07 ENCOUNTER — Ambulatory Visit (HOSPITAL_COMMUNITY): Attending: Family Medicine | Admitting: Occupational Therapy

## 2024-01-07 ENCOUNTER — Encounter (HOSPITAL_COMMUNITY): Payer: Self-pay | Admitting: Occupational Therapy

## 2024-01-07 DIAGNOSIS — M25632 Stiffness of left wrist, not elsewhere classified: Secondary | ICD-10-CM | POA: Diagnosis present

## 2024-01-07 DIAGNOSIS — R600 Localized edema: Secondary | ICD-10-CM

## 2024-01-07 DIAGNOSIS — R29898 Other symptoms and signs involving the musculoskeletal system: Secondary | ICD-10-CM

## 2024-01-07 DIAGNOSIS — M25532 Pain in left wrist: Secondary | ICD-10-CM
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
## 2024-01-07 NOTE — Patient Instructions (Signed)
 Strengthening Exercises  *Complete exercises using 2 pound weight, 10 times each, 2-3 times per day*  1) WRIST EXTENSION CURLS - TABLE  Hold a small free weight, rest your forearm on a table and bend your wrist up and down with your palm face down as shown.      2) WRIST FLEXION CURLS - TABLE  Hold a small free weight, rest your forearm on a table and bend your wrist up and down with your palm face up as shown.     3) FREE WEIGHT RADIAL/ULNAR DEVIATION - TABLE  Hold a small free weight, rest your forearm on a table and bend your wrist up and down with your palm facing towards the side as shown.     4) Pronation  Forearm supported on table with wrist in neutral position. Using a weight, roll wrist so that palm faces downward. Hold for 2 seconds and return to starting position.     5) Supination  Forearm supported on table with wrist in neutral position. Using a weight, roll wrist so that palm is now facing upward. Hold for 2 seconds and return to starting position.

## 2024-01-09 ENCOUNTER — Encounter (HOSPITAL_COMMUNITY): Payer: Self-pay | Admitting: Occupational Therapy

## 2024-01-09 ENCOUNTER — Ambulatory Visit (HOSPITAL_COMMUNITY): Admitting: Occupational Therapy

## 2024-01-09 DIAGNOSIS — M25532 Pain in left wrist: Secondary | ICD-10-CM | POA: Diagnosis not present

## 2024-01-09 DIAGNOSIS — R600 Localized edema: Secondary | ICD-10-CM

## 2024-01-09 DIAGNOSIS — R29898 Other symptoms and signs involving the musculoskeletal system: Secondary | ICD-10-CM

## 2024-01-09 DIAGNOSIS — M25632 Stiffness of left wrist, not elsewhere classified: Secondary | ICD-10-CM
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
## 2024-01-14 ENCOUNTER — Encounter (HOSPITAL_COMMUNITY): Payer: Self-pay | Admitting: Occupational Therapy

## 2024-01-14 ENCOUNTER — Ambulatory Visit (HOSPITAL_COMMUNITY): Admitting: Occupational Therapy

## 2024-01-14 DIAGNOSIS — M25532 Pain in left wrist: Secondary | ICD-10-CM

## 2024-01-14 DIAGNOSIS — R29898 Other symptoms and signs involving the musculoskeletal system: Secondary | ICD-10-CM

## 2024-01-14 DIAGNOSIS — M25632 Stiffness of left wrist, not elsewhere classified: Secondary | ICD-10-CM
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
## 2024-01-16 ENCOUNTER — Ambulatory Visit (HOSPITAL_COMMUNITY): Admitting: Occupational Therapy

## 2024-01-16 ENCOUNTER — Encounter (HOSPITAL_COMMUNITY): Payer: Self-pay | Admitting: Occupational Therapy

## 2024-01-16 DIAGNOSIS — R600 Localized edema: Secondary | ICD-10-CM

## 2024-01-16 DIAGNOSIS — M25532 Pain in left wrist: Secondary | ICD-10-CM

## 2024-01-16 DIAGNOSIS — M25632 Stiffness of left wrist, not elsewhere classified: Secondary | ICD-10-CM

## 2024-01-16 DIAGNOSIS — R29898 Other symptoms and signs involving the musculoskeletal system: Secondary | ICD-10-CM
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
## 2024-01-21 ENCOUNTER — Encounter (HOSPITAL_COMMUNITY): Payer: Self-pay | Admitting: Occupational Therapy

## 2024-01-21 ENCOUNTER — Ambulatory Visit (HOSPITAL_COMMUNITY): Admitting: Occupational Therapy

## 2024-01-21 DIAGNOSIS — M25532 Pain in left wrist: Secondary | ICD-10-CM

## 2024-01-21 DIAGNOSIS — R600 Localized edema: Secondary | ICD-10-CM

## 2024-01-21 DIAGNOSIS — R29898 Other symptoms and signs involving the musculoskeletal system: Secondary | ICD-10-CM

## 2024-01-21 DIAGNOSIS — M25632 Stiffness of left wrist, not elsewhere classified: Secondary | ICD-10-CM
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
## 2024-01-21 NOTE — Patient Instructions (Signed)

## 2024-01-23 ENCOUNTER — Ambulatory Visit (HOSPITAL_COMMUNITY): Admitting: Occupational Therapy

## 2024-01-23 ENCOUNTER — Encounter (HOSPITAL_COMMUNITY): Payer: Self-pay | Admitting: Occupational Therapy

## 2024-01-23 DIAGNOSIS — M25532 Pain in left wrist: Secondary | ICD-10-CM | POA: Diagnosis not present

## 2024-01-23 DIAGNOSIS — M25632 Stiffness of left wrist, not elsewhere classified: Secondary | ICD-10-CM

## 2024-01-23 DIAGNOSIS — R29898 Other symptoms and signs involving the musculoskeletal system: Secondary | ICD-10-CM
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
## 2024-01-28 ENCOUNTER — Encounter (HOSPITAL_COMMUNITY): Payer: Self-pay | Admitting: Occupational Therapy

## 2024-01-28 ENCOUNTER — Ambulatory Visit (HOSPITAL_COMMUNITY): Admitting: Occupational Therapy

## 2024-01-28 DIAGNOSIS — M25532 Pain in left wrist: Secondary | ICD-10-CM

## 2024-01-28 DIAGNOSIS — R29898 Other symptoms and signs involving the musculoskeletal system: Secondary | ICD-10-CM

## 2024-01-28 DIAGNOSIS — M25632 Stiffness of left wrist, not elsewhere classified: Secondary | ICD-10-CM
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
## 2024-01-30 ENCOUNTER — Encounter (HOSPITAL_COMMUNITY): Payer: Self-pay | Admitting: Occupational Therapy

## 2024-01-30 ENCOUNTER — Ambulatory Visit (HOSPITAL_COMMUNITY): Admitting: Occupational Therapy

## 2024-01-30 DIAGNOSIS — M25532 Pain in left wrist: Secondary | ICD-10-CM

## 2024-01-30 DIAGNOSIS — M25632 Stiffness of left wrist, not elsewhere classified: Secondary | ICD-10-CM

## 2024-01-30 DIAGNOSIS — R29898 Other symptoms and signs involving the musculoskeletal system: Secondary | ICD-10-CM
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
## 2024-02-04 ENCOUNTER — Encounter (HOSPITAL_COMMUNITY): Payer: Self-pay | Admitting: Occupational Therapy

## 2024-02-04 ENCOUNTER — Ambulatory Visit (HOSPITAL_COMMUNITY): Attending: Family Medicine | Admitting: Occupational Therapy

## 2024-02-04 DIAGNOSIS — R29898 Other symptoms and signs involving the musculoskeletal system: Secondary | ICD-10-CM | POA: Diagnosis present

## 2024-02-04 DIAGNOSIS — M25532 Pain in left wrist: Secondary | ICD-10-CM | POA: Insufficient documentation

## 2024-02-04 DIAGNOSIS — M25632 Stiffness of left wrist, not elsewhere classified: Secondary | ICD-10-CM | POA: Insufficient documentation
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
## 2024-02-11 ENCOUNTER — Ambulatory Visit (HOSPITAL_COMMUNITY): Admitting: Occupational Therapy

## 2024-02-11 ENCOUNTER — Encounter (HOSPITAL_COMMUNITY): Payer: Self-pay | Admitting: Occupational Therapy

## 2024-02-11 DIAGNOSIS — M25532 Pain in left wrist: Secondary | ICD-10-CM

## 2024-02-11 DIAGNOSIS — R29898 Other symptoms and signs involving the musculoskeletal system: Secondary | ICD-10-CM

## 2024-02-11 DIAGNOSIS — M25632 Stiffness of left wrist, not elsewhere classified: Secondary | ICD-10-CM
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
## 2024-02-13 ENCOUNTER — Encounter (HOSPITAL_COMMUNITY): Payer: Self-pay | Admitting: Occupational Therapy

## 2024-02-13 ENCOUNTER — Ambulatory Visit (HOSPITAL_COMMUNITY): Admitting: Occupational Therapy

## 2024-02-13 DIAGNOSIS — M25632 Stiffness of left wrist, not elsewhere classified: Secondary | ICD-10-CM

## 2024-02-13 DIAGNOSIS — R29898 Other symptoms and signs involving the musculoskeletal system: Secondary | ICD-10-CM

## 2024-02-13 DIAGNOSIS — M25532 Pain in left wrist: Secondary | ICD-10-CM
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
## 2024-02-18 ENCOUNTER — Encounter (HOSPITAL_COMMUNITY): Payer: Self-pay | Admitting: Occupational Therapy

## 2024-02-18 ENCOUNTER — Ambulatory Visit (HOSPITAL_COMMUNITY): Admitting: Occupational Therapy

## 2024-02-18 DIAGNOSIS — M25532 Pain in left wrist: Secondary | ICD-10-CM

## 2024-02-18 DIAGNOSIS — R29898 Other symptoms and signs involving the musculoskeletal system: Secondary | ICD-10-CM

## 2024-02-18 DIAGNOSIS — M25632 Stiffness of left wrist, not elsewhere classified: Secondary | ICD-10-CM
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
## 2024-02-20 ENCOUNTER — Encounter (HOSPITAL_COMMUNITY): Payer: Self-pay | Admitting: Occupational Therapy

## 2024-02-20 ENCOUNTER — Ambulatory Visit (HOSPITAL_COMMUNITY): Admitting: Occupational Therapy

## 2024-02-20 DIAGNOSIS — M25632 Stiffness of left wrist, not elsewhere classified: Secondary | ICD-10-CM

## 2024-02-20 DIAGNOSIS — M25532 Pain in left wrist: Secondary | ICD-10-CM | POA: Diagnosis not present

## 2024-02-20 DIAGNOSIS — R29898 Other symptoms and signs involving the musculoskeletal system: Secondary | ICD-10-CM
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
## 2024-02-26 ENCOUNTER — Ambulatory Visit (HOSPITAL_COMMUNITY): Admitting: Occupational Therapy

## 2024-02-26 ENCOUNTER — Encounter (HOSPITAL_COMMUNITY): Payer: Self-pay | Admitting: Occupational Therapy

## 2024-02-26 DIAGNOSIS — M25632 Stiffness of left wrist, not elsewhere classified: Secondary | ICD-10-CM

## 2024-02-26 DIAGNOSIS — M25532 Pain in left wrist: Secondary | ICD-10-CM

## 2024-02-26 DIAGNOSIS — R29898 Other symptoms and signs involving the musculoskeletal system: Secondary | ICD-10-CM
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
## 2024-03-04 ENCOUNTER — Ambulatory Visit (HOSPITAL_COMMUNITY): Admitting: Occupational Therapy

## 2024-03-04 ENCOUNTER — Encounter (HOSPITAL_COMMUNITY): Payer: Self-pay | Admitting: Occupational Therapy

## 2024-03-04 DIAGNOSIS — R29898 Other symptoms and signs involving the musculoskeletal system: Secondary | ICD-10-CM

## 2024-03-04 DIAGNOSIS — M25632 Stiffness of left wrist, not elsewhere classified: Secondary | ICD-10-CM

## 2024-03-04 DIAGNOSIS — M25532 Pain in left wrist: Secondary | ICD-10-CM | POA: Diagnosis not present
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
## 2024-03-10 ENCOUNTER — Encounter (HOSPITAL_COMMUNITY): Payer: Self-pay | Admitting: Occupational Therapy

## 2024-03-10 ENCOUNTER — Ambulatory Visit (HOSPITAL_COMMUNITY): Attending: Family Medicine | Admitting: Occupational Therapy

## 2024-03-10 DIAGNOSIS — R29898 Other symptoms and signs involving the musculoskeletal system: Secondary | ICD-10-CM | POA: Diagnosis present

## 2024-03-10 DIAGNOSIS — M25532 Pain in left wrist: Secondary | ICD-10-CM | POA: Insufficient documentation

## 2024-03-10 DIAGNOSIS — M25632 Stiffness of left wrist, not elsewhere classified: Secondary | ICD-10-CM | POA: Diagnosis present
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
## 2024-03-11 ENCOUNTER — Encounter (HOSPITAL_COMMUNITY): Admitting: Occupational Therapy

## 2024-03-18 ENCOUNTER — Encounter (HOSPITAL_COMMUNITY): Payer: Self-pay | Admitting: Occupational Therapy

## 2024-03-18 ENCOUNTER — Ambulatory Visit (HOSPITAL_COMMUNITY): Admitting: Occupational Therapy

## 2024-03-18 DIAGNOSIS — R29898 Other symptoms and signs involving the musculoskeletal system: Secondary | ICD-10-CM

## 2024-03-18 DIAGNOSIS — M25532 Pain in left wrist: Secondary | ICD-10-CM | POA: Diagnosis not present

## 2024-03-18 DIAGNOSIS — M25632 Stiffness of left wrist, not elsewhere classified: Secondary | ICD-10-CM
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
## 2024-03-25 ENCOUNTER — Encounter (HOSPITAL_COMMUNITY): Admitting: Occupational Therapy

## 2024-03-29 ENCOUNTER — Telehealth: Payer: Self-pay

## 2024-03-29 ENCOUNTER — Other Ambulatory Visit: Payer: Self-pay | Admitting: Family Medicine

## 2024-03-29 MED ORDER — ALPRAZOLAM 0.5 MG PO TABS: 0.5000 mg | ORAL_TABLET | Freq: Three times a day (TID) | ORAL | 0 refills | Status: AC | PRN

## 2024-03-29 NOTE — Telephone Encounter (Signed)
 Copied from CRM #[id number]. Topic: Clinical - Medical Advice >> Mar 29, 2024  2:34 PM Lauren C wrote: Reason for CRM: Pt has her mom's funeral tomorrow and feels like she really needs some relief of her anxiety for it. She is hoping a small amount of something like a benzodiazapine can be called in for her by tomorrow, but understands this is very last minute.  Her preferred pharmacy is [facility] off [address] (added as her primary)

## 2024-04-06 ENCOUNTER — Ambulatory Visit: Payer: Self-pay

## 2024-04-06 NOTE — Telephone Encounter (Signed)
 FYI Only or Action Required?: FYI only for provider.  Patient was last seen in primary care on 11/18/2023 by Duanne Butler DASEN, MD.  Called Nurse Triage reporting Back Pain.  Symptoms began several days ago.  Interventions attempted: Prescription medications: oxycodone  and Rest, hydration, or home remedies.  Symptoms are: unchanged.  Triage Disposition: See PCP When Office is Open (Within 3 Days)  Patient/caregiver understands and will follow disposition?: Yes  Copied from CRM #[id number]. Topic: Clinical - Red Word Triage >> Apr 06, 2024  8:39 AM Rosina BIRCH wrote: Red Word that prompted transfer to Nurse Triage: back pain since late Wednesday Reason for Disposition  [1] MODERATE back pain (e.g., interferes with normal activities) AND [2] present > 3 days  Answer Assessment - Initial Assessment Questions 1. ONSET: When did the pain begin? (e.g., minutes, hours, days)     Started last Wednesday 2. LOCATION: Where does it hurt? (upper, mid or lower back)     Lower back 3. SEVERITY: How bad is the pain?  (e.g., Scale 1-10; mild, moderate, or severe)     Mild-moderate currently 4. PATTERN: Is the pain constant? (e.g., yes, no; constant, intermittent)      Constant-pain waxes and wanes with intensity 5. RADIATION: Does the pain shoot into your legs or somewhere else?     No radiation per patient 6. CAUSE:  What do you think is causing the back pain?      Unsure of what is causing the back pain 7. BACK OVERUSE:  Any recent lifting of heavy objects, strenuous work or exercise?     no 8. MEDICINES: What have you taken so far for the pain? (e.g., nothing, acetaminophen , NSAIDS)     Oxycodone , muscle relaxer 9. NEUROLOGIC SYMPTOMS: Do you have any weakness, numbness, or problems with bowel/bladder control?     no 10. OTHER SYMPTOMS: Do you have any other symptoms? (e.g., fever, abdomen pain, burning with urination, blood in urine)       no  Protocols used: Back  Pain-A-AH

## 2024-04-07 ENCOUNTER — Encounter: Payer: Self-pay | Admitting: Family Medicine

## 2024-04-07 ENCOUNTER — Ambulatory Visit: Admitting: Family Medicine

## 2024-04-07 VITALS — BP 140/100 | HR 91 | Temp 98.4°F | Ht 67.0 in | Wt 186.0 lb

## 2024-04-07 DIAGNOSIS — M545 Low back pain, unspecified: Secondary | ICD-10-CM | POA: Diagnosis not present

## 2024-04-07 MED ORDER — PREDNISONE 10 MG (21) PO TBPK: ORAL_TABLET | ORAL | 0 refills | Status: AC

## 2024-04-07 MED ORDER — METHOCARBAMOL 500 MG PO TABS: 500.0000 mg | ORAL_TABLET | Freq: Three times a day (TID) | ORAL | 1 refills | Status: AC | PRN
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
## 2024-08-18 ENCOUNTER — Other Ambulatory Visit: Payer: Self-pay | Admitting: Family Medicine

## 2024-09-07 ENCOUNTER — Telehealth: Payer: Self-pay | Admitting: Hematology and Oncology

## 2024-09-28 ENCOUNTER — Ambulatory Visit: Payer: 59 | Admitting: Hematology and Oncology

## 2024-10-07 ENCOUNTER — Inpatient Hospital Stay: Admitting: Hematology and Oncology
